# Patient Record
Sex: Female | Born: 1965 | Race: White | Hispanic: No | Marital: Married | Smoking: Never smoker
Health system: Southern US, Community
[De-identification: ages and names within clinical notes are randomized; demographics above are authoritative.]

## PROBLEM LIST (undated history)

## (undated) DIAGNOSIS — E8881 Metabolic syndrome: Secondary | ICD-10-CM

## (undated) DIAGNOSIS — I10 Essential (primary) hypertension: Secondary | ICD-10-CM

## (undated) DIAGNOSIS — L509 Urticaria, unspecified: Secondary | ICD-10-CM

## (undated) DIAGNOSIS — T783XXA Angioneurotic edema, initial encounter: Secondary | ICD-10-CM

## (undated) DIAGNOSIS — J069 Acute upper respiratory infection, unspecified: Secondary | ICD-10-CM

## (undated) DIAGNOSIS — E039 Hypothyroidism, unspecified: Secondary | ICD-10-CM

## (undated) HISTORY — DX: Acute upper respiratory infection, unspecified: J06.9

## (undated) HISTORY — DX: Metabolic syndrome: E88.81

## (undated) HISTORY — DX: Metabolic syndrome: E88.810

## (undated) HISTORY — DX: Essential (primary) hypertension: I10

## (undated) HISTORY — PX: DENTAL SURGERY: SHX609

## (undated) HISTORY — DX: Angioneurotic edema, initial encounter: T78.3XXA

## (undated) HISTORY — DX: Urticaria, unspecified: L50.9

---

## 2017-08-17 ENCOUNTER — Emergency Department (HOSPITAL_COMMUNITY): Payer: Self-pay

## 2017-08-17 ENCOUNTER — Inpatient Hospital Stay (HOSPITAL_COMMUNITY)
Admission: EM | Admit: 2017-08-17 | Discharge: 2017-09-10 | DRG: 004 | Disposition: A | Discharge status: Rehabilitation Facility | Payer: Self-pay | Attending: Internal Medicine | Admitting: Internal Medicine

## 2017-08-17 ENCOUNTER — Encounter (HOSPITAL_COMMUNITY): Payer: Self-pay | Admitting: Internal Medicine

## 2017-08-17 DIAGNOSIS — J101 Influenza due to other identified influenza virus with other respiratory manifestations: Secondary | ICD-10-CM

## 2017-08-17 DIAGNOSIS — J384 Edema of larynx: Secondary | ICD-10-CM | POA: Diagnosis not present

## 2017-08-17 DIAGNOSIS — Z4659 Encounter for fitting and adjustment of other gastrointestinal appliance and device: Secondary | ICD-10-CM

## 2017-08-17 DIAGNOSIS — R042 Hemoptysis: Secondary | ICD-10-CM | POA: Diagnosis not present

## 2017-08-17 DIAGNOSIS — J9601 Acute respiratory failure with hypoxia: Secondary | ICD-10-CM

## 2017-08-17 DIAGNOSIS — E878 Other disorders of electrolyte and fluid balance, not elsewhere classified: Secondary | ICD-10-CM

## 2017-08-17 DIAGNOSIS — K5900 Constipation, unspecified: Secondary | ICD-10-CM | POA: Diagnosis present

## 2017-08-17 DIAGNOSIS — J9600 Acute respiratory failure, unspecified whether with hypoxia or hypercapnia: Secondary | ICD-10-CM

## 2017-08-17 DIAGNOSIS — F419 Anxiety disorder, unspecified: Secondary | ICD-10-CM | POA: Diagnosis present

## 2017-08-17 DIAGNOSIS — D6489 Other specified anemias: Secondary | ICD-10-CM | POA: Diagnosis present

## 2017-08-17 DIAGNOSIS — R6521 Severe sepsis with septic shock: Secondary | ICD-10-CM | POA: Diagnosis present

## 2017-08-17 DIAGNOSIS — I10 Essential (primary) hypertension: Secondary | ICD-10-CM | POA: Diagnosis present

## 2017-08-17 DIAGNOSIS — N179 Acute kidney failure, unspecified: Secondary | ICD-10-CM | POA: Diagnosis present

## 2017-08-17 DIAGNOSIS — R7401 Elevation of levels of liver transaminase levels: Secondary | ICD-10-CM

## 2017-08-17 DIAGNOSIS — E860 Dehydration: Secondary | ICD-10-CM | POA: Diagnosis present

## 2017-08-17 DIAGNOSIS — E873 Alkalosis: Secondary | ICD-10-CM | POA: Diagnosis present

## 2017-08-17 DIAGNOSIS — Z781 Physical restraint status: Secondary | ICD-10-CM

## 2017-08-17 DIAGNOSIS — R945 Abnormal results of liver function studies: Secondary | ICD-10-CM | POA: Diagnosis present

## 2017-08-17 DIAGNOSIS — E870 Hyperosmolality and hypernatremia: Secondary | ICD-10-CM | POA: Diagnosis not present

## 2017-08-17 DIAGNOSIS — E039 Hypothyroidism, unspecified: Secondary | ICD-10-CM | POA: Diagnosis present

## 2017-08-17 DIAGNOSIS — Z930 Tracheostomy status: Secondary | ICD-10-CM

## 2017-08-17 DIAGNOSIS — S27309A Unspecified injury of lung, unspecified, initial encounter: Secondary | ICD-10-CM

## 2017-08-17 DIAGNOSIS — R1313 Dysphagia, pharyngeal phase: Secondary | ICD-10-CM | POA: Diagnosis present

## 2017-08-17 DIAGNOSIS — R1312 Dysphagia, oropharyngeal phase: Secondary | ICD-10-CM | POA: Diagnosis present

## 2017-08-17 DIAGNOSIS — E8770 Fluid overload, unspecified: Secondary | ICD-10-CM | POA: Diagnosis present

## 2017-08-17 DIAGNOSIS — D62 Acute posthemorrhagic anemia: Secondary | ICD-10-CM | POA: Diagnosis present

## 2017-08-17 DIAGNOSIS — J189 Pneumonia, unspecified organism: Secondary | ICD-10-CM | POA: Diagnosis present

## 2017-08-17 DIAGNOSIS — E876 Hypokalemia: Secondary | ICD-10-CM | POA: Diagnosis present

## 2017-08-17 DIAGNOSIS — R7989 Other specified abnormal findings of blood chemistry: Secondary | ICD-10-CM | POA: Diagnosis present

## 2017-08-17 DIAGNOSIS — J80 Acute respiratory distress syndrome: Secondary | ICD-10-CM | POA: Diagnosis present

## 2017-08-17 DIAGNOSIS — J9690 Respiratory failure, unspecified, unspecified whether with hypoxia or hypercapnia: Secondary | ICD-10-CM

## 2017-08-17 DIAGNOSIS — R7303 Prediabetes: Secondary | ICD-10-CM | POA: Diagnosis present

## 2017-08-17 DIAGNOSIS — R5381 Other malaise: Secondary | ICD-10-CM | POA: Diagnosis not present

## 2017-08-17 DIAGNOSIS — R0682 Tachypnea, not elsewhere classified: Secondary | ICD-10-CM

## 2017-08-17 DIAGNOSIS — Z79899 Other long term (current) drug therapy: Secondary | ICD-10-CM

## 2017-08-17 DIAGNOSIS — R061 Stridor: Secondary | ICD-10-CM | POA: Diagnosis not present

## 2017-08-17 DIAGNOSIS — Z978 Presence of other specified devices: Secondary | ICD-10-CM

## 2017-08-17 DIAGNOSIS — Z9911 Dependence on respirator [ventilator] status: Secondary | ICD-10-CM

## 2017-08-17 DIAGNOSIS — E871 Hypo-osmolality and hyponatremia: Secondary | ICD-10-CM | POA: Diagnosis not present

## 2017-08-17 DIAGNOSIS — Z9289 Personal history of other medical treatment: Secondary | ICD-10-CM

## 2017-08-17 DIAGNOSIS — R739 Hyperglycemia, unspecified: Secondary | ICD-10-CM | POA: Diagnosis present

## 2017-08-17 DIAGNOSIS — A419 Sepsis, unspecified organism: Principal | ICD-10-CM | POA: Diagnosis present

## 2017-08-17 DIAGNOSIS — R740 Nonspecific elevation of levels of transaminase and lactic acid dehydrogenase [LDH]: Secondary | ICD-10-CM

## 2017-08-17 DIAGNOSIS — Y95 Nosocomial condition: Secondary | ICD-10-CM | POA: Diagnosis present

## 2017-08-17 DIAGNOSIS — J1008 Influenza due to other identified influenza virus with other specified pneumonia: Secondary | ICD-10-CM | POA: Diagnosis present

## 2017-08-17 DIAGNOSIS — Z6841 Body Mass Index (BMI) 40.0 and over, adult: Secondary | ICD-10-CM

## 2017-08-17 DIAGNOSIS — Z452 Encounter for adjustment and management of vascular access device: Secondary | ICD-10-CM

## 2017-08-17 DIAGNOSIS — Z01818 Encounter for other preprocedural examination: Secondary | ICD-10-CM

## 2017-08-17 DIAGNOSIS — R000 Tachycardia, unspecified: Secondary | ICD-10-CM

## 2017-08-17 HISTORY — DX: Hypothyroidism, unspecified: E03.9

## 2017-08-17 LAB — CBC WITH DIFFERENTIAL/PLATELET
Basophils Absolute: 0 10*3/uL (ref 0.0–0.1)
Basophils Relative: 0 %
EOS ABS: 0 10*3/uL (ref 0.0–0.7)
Eosinophils Relative: 0 %
HCT: 35 % — ABNORMAL LOW (ref 36.0–46.0)
HEMOGLOBIN: 11.3 g/dL — AB (ref 12.0–15.0)
LYMPHS ABS: 0.5 10*3/uL — AB (ref 0.7–4.0)
LYMPHS PCT: 10 %
MCH: 23.5 pg — AB (ref 26.0–34.0)
MCHC: 32.3 g/dL (ref 30.0–36.0)
MCV: 72.9 fL — AB (ref 78.0–100.0)
Monocytes Absolute: 0.3 10*3/uL (ref 0.1–1.0)
Monocytes Relative: 6 %
NEUTROS PCT: 84 %
Neutro Abs: 4.5 10*3/uL (ref 1.7–7.7)
Platelets: 217 10*3/uL (ref 150–400)
RBC: 4.8 MIL/uL (ref 3.87–5.11)
RDW: 17 % — ABNORMAL HIGH (ref 11.5–15.5)
WBC: 5.2 10*3/uL (ref 4.0–10.5)

## 2017-08-17 LAB — URINALYSIS, ROUTINE W REFLEX MICROSCOPIC
Bilirubin Urine: NEGATIVE
Glucose, UA: NEGATIVE mg/dL
KETONES UR: NEGATIVE mg/dL
Leukocytes, UA: NEGATIVE
Nitrite: NEGATIVE
PROTEIN: 30 mg/dL — AB
Specific Gravity, Urine: 1.005 (ref 1.005–1.030)
Squamous Epithelial / LPF: NONE SEEN
pH: 6 (ref 5.0–8.0)

## 2017-08-17 LAB — COMPREHENSIVE METABOLIC PANEL
ALT: 30 U/L (ref 14–54)
ANION GAP: 10 (ref 5–15)
AST: 85 U/L — ABNORMAL HIGH (ref 15–41)
Albumin: 2.9 g/dL — ABNORMAL LOW (ref 3.5–5.0)
Alkaline Phosphatase: 84 U/L (ref 38–126)
BUN: 14 mg/dL (ref 6–20)
CHLORIDE: 97 mmol/L — AB (ref 101–111)
CO2: 23 mmol/L (ref 22–32)
Calcium: 8.7 mg/dL — ABNORMAL LOW (ref 8.9–10.3)
Creatinine, Ser: 1.03 mg/dL — ABNORMAL HIGH (ref 0.44–1.00)
GFR calc non Af Amer: >60 mL/min (ref >60)
Glucose, Bld: 148 mg/dL — ABNORMAL HIGH (ref 65–99)
POTASSIUM: 4.6 mmol/L (ref 3.5–5.1)
Sodium: 130 mmol/L — ABNORMAL LOW (ref 135–145)
Total Bilirubin: 1.3 mg/dL — ABNORMAL HIGH (ref 0.3–1.2)
Total Protein: 6 g/dL — ABNORMAL LOW (ref 6.5–8.1)

## 2017-08-17 LAB — PROCALCITONIN: Procalcitonin: 0.34 ng/mL

## 2017-08-17 LAB — I-STAT CG4 LACTIC ACID, ED: LACTIC ACID, VENOUS: 1.99 mmol/L — AB (ref 0.5–1.9)

## 2017-08-17 LAB — I-STAT BETA HCG BLOOD, ED (MC, WL, AP ONLY)

## 2017-08-17 MED ORDER — DEXTROSE 5 % IV SOLN: 1.0000 g | INTRAVENOUS | Status: DC

## 2017-08-17 MED ORDER — DM-GUAIFENESIN ER 30-600 MG PO TB12: 1.0000 | ORAL_TABLET | Freq: Two times a day (BID) | ORAL | Status: DC | PRN

## 2017-08-17 MED ORDER — ZOLPIDEM TARTRATE 5 MG PO TABS: 5.0000 mg | ORAL_TABLET | Freq: Every evening | ORAL | Status: DC | PRN

## 2017-08-17 MED ORDER — LEVALBUTEROL HCL 1.25 MG/0.5ML IN NEBU
1.2500 mg | INHALATION_SOLUTION | Freq: Four times a day (QID) | RESPIRATORY_TRACT | Status: DC | PRN
  Administered 2017-08-24: 1.25 mg via RESPIRATORY_TRACT
  Filled 2017-08-17 (×2): qty 0.5

## 2017-08-17 MED ORDER — DEXTROSE 5 % IV SOLN
500.0000 mg | Freq: Once | INTRAVENOUS | Status: AC
  Administered 2017-08-17: 500 mg via INTRAVENOUS
  Filled 2017-08-17: qty 500

## 2017-08-17 MED ORDER — LEVALBUTEROL HCL 1.25 MG/0.5ML IN NEBU
1.2500 mg | INHALATION_SOLUTION | Freq: Three times a day (TID) | RESPIRATORY_TRACT | Status: DC
  Administered 2017-08-18 – 2017-08-30 (×36): 1.25 mg via RESPIRATORY_TRACT
  Filled 2017-08-17 (×39): qty 0.5

## 2017-08-17 MED ORDER — SODIUM CHLORIDE 0.9 % IV BOLUS (SEPSIS)
1000.0000 mL | Freq: Once | INTRAVENOUS | Status: AC
  Administered 2017-08-17: 1000 mL via INTRAVENOUS

## 2017-08-17 MED ORDER — AZITHROMYCIN 500 MG IV SOLR
500.0000 mg | INTRAVENOUS | Status: DC
  Administered 2017-08-18 – 2017-08-20 (×3): 500 mg via INTRAVENOUS
  Filled 2017-08-17 (×4): qty 500

## 2017-08-17 MED ORDER — HYDROCOD POLST-CPM POLST ER 10-8 MG/5ML PO SUER
5.0000 mL | Freq: Once | ORAL | Status: AC
  Administered 2017-08-18: 5 mL via ORAL
  Filled 2017-08-17: qty 5

## 2017-08-17 MED ORDER — SODIUM CHLORIDE 0.9 % IV SOLN
INTRAVENOUS | Status: DC
  Administered 2017-08-17 – 2017-08-18 (×3): via INTRAVENOUS

## 2017-08-17 MED ORDER — DEXTROSE 5 % IV SOLN: 500.0000 mg | INTRAVENOUS | Status: DC

## 2017-08-17 MED ORDER — IBUPROFEN 400 MG PO TABS
400.0000 mg | ORAL_TABLET | Freq: Four times a day (QID) | ORAL | Status: DC | PRN
  Administered 2017-08-18 (×2): 400 mg via ORAL
  Filled 2017-08-17 (×2): qty 2

## 2017-08-17 MED ORDER — SODIUM CHLORIDE 0.9 % IV BOLUS (SEPSIS)
1500.0000 mL | Freq: Once | INTRAVENOUS | Status: AC
  Administered 2017-08-17: 1500 mL via INTRAVENOUS

## 2017-08-17 MED ORDER — DEXTROSE 5 % IV SOLN
1.0000 g | INTRAVENOUS | Status: DC
  Administered 2017-08-18 – 2017-08-19 (×2): 1 g via INTRAVENOUS
  Filled 2017-08-17 (×3): qty 10

## 2017-08-17 MED ORDER — ONDANSETRON HCL 4 MG/2ML IJ SOLN: 4.0000 mg | Freq: Three times a day (TID) | INTRAMUSCULAR | Status: DC | PRN

## 2017-08-17 MED ORDER — THYROID 60 MG PO TABS: 90.0000 mg | ORAL_TABLET | Freq: Every day | ORAL | Status: DC

## 2017-08-17 MED ORDER — DEXTROSE 5 % IV SOLN
1.0000 g | Freq: Once | INTRAVENOUS | Status: AC
  Administered 2017-08-17: 1 g via INTRAVENOUS
  Filled 2017-08-17: qty 10

## 2017-08-17 MED ORDER — LEVALBUTEROL HCL 1.25 MG/0.5ML IN NEBU
1.2500 mg | INHALATION_SOLUTION | Freq: Four times a day (QID) | RESPIRATORY_TRACT | Status: DC
  Administered 2017-08-17: 1.25 mg via RESPIRATORY_TRACT
  Filled 2017-08-17 (×4): qty 0.5

## 2017-08-17 MED ORDER — ACETAMINOPHEN 325 MG PO TABS: 650.0000 mg | ORAL_TABLET | Freq: Four times a day (QID) | ORAL | Status: DC | PRN

## 2017-08-17 NOTE — ED Triage Notes (Signed)
Pt to ER for evaluation of shortness of breath and weakness - states was seen at [facility] Monday and d/c with diagnosis of pneumonia. Pt presents short of breath today, O2 sats 82% on RA, placed on 4L nasal cannula. Pt is a/o x4.

## 2017-08-17 NOTE — H&P (Addendum)
History and Physical    Cassie [name] HEN:[id number] DOB: 15-Mar-1966 DOA: 08/17/2017  Referring MD/NP/PA:   PCP: Patient, No Pcp Per   Patient coming from:  The patient is coming from home.  At baseline, pt is independent for most of ADL.   Chief Complaint: cough and SOB  HPI: Cassie [name] is a 52 y.o. female with medical history significant of hypothyroidism, who presents with cough and shortness of breath.  Pt states that she has been having dry cough and SOB for almost 5 days. She also has a fever and chills. She denies any chest pain, runny nose or sore throat. Patient was seen in [facility] and had chest x-ray, was diagnosed with pneumonia on Monday. Patient also reports right ear pain, no ear drainage or hearing loss. No nausea vomiting, diarrhea, abdominal pain, symptoms of UTI or unilateral weakness. Pt state that she travelled back from [place] last weak. No tenderness in the areas.  ED Course: pt was found to have WBC 5.2, lactic acid 1.99, abnormal liver functions with AST 85, ALT 30, total bilirubin 1.3, ALP 84. negative pregnancy tests, temperature 101.2 tachycardia, tachypnea, oxygen saturation desaturation to 82% on room air, which improved to 94% on 5 L nasal cannula oxygen. Chest x-ray showed multifocal infiltration. Patient is placed on telemetry bed for observation.  Review of Systems:   General: has fevers, chills, no body weight gain, has poor appetite, has fatigue HEENT: no blurry vision, hearing changes or sore throat. Right ear pain. Respiratory: has dyspnea, coughing, no wheezing CV: no chest pain, no palpitations GI: no nausea, vomiting, abdominal pain, diarrhea, constipation GU: no dysuria, burning on urination, increased urinary frequency, hematuria  Ext: no leg edema Neuro: no unilateral weakness, numbness, or tingling, no vision change or hearing loss Skin: no rash, no skin tear. MSK: No muscle spasm, no deformity, no limitation of range of movement in  spin Heme: No easy bruising.  Travel history: No recent long distant travel.  Allergy:  Allergies  Allergen Reactions  . Sulfa Antibiotics     Past Medical History:  Diagnosis Date  . Hypothyroidism     Past Surgical History:  Procedure Laterality Date  . DENTAL SURGERY      Social History:  reports that  has never smoked. she has never used smokeless tobacco. She reports that she does not drink alcohol or use drugs.  Family History:  Family History  Problem Relation Age of Onset  . Kidney disease Father      Prior to Admission medications   Not on File    Physical Exam: Vitals:   08/17/17 2205 08/17/17 2342 08/18/17 0216 08/18/17 0228  BP: (!) 153/91  (!) 153/63   Pulse: (!) 110 (!) 106 96   Resp: (!) 22 (!) 22 (!) 22   Temp: 99.5 F (37.5 C)  (!) 103.2 F (39.6 C)   TempSrc: Oral  Oral   SpO2: 90%   91%  Weight: 102.9 kg (226 lb 12.8 oz)     Height: 5\' 4"  (1.626 m)      General: Not in acute distress HEENT:       Eyes: PERRL, EOMI, no scleral icterus.       ENT: No discharge from the ears and nose, no pharynx injection, no tonsillar enlargement.        Neck: No JVD, no bruit, no mass felt. Heme: No neck lymph node enlargement. Cardiac: S1/S2, RRR, No murmurs, No gallops or rubs. Respiratory:  No rales, wheezing,  rhonchi or rubs. GI: Soft, nondistended, nontender, no rebound pain, no organomegaly, BS present. GU: No hematuria Ext: No pitting leg edema bilaterally. 2+DP/PT pulse bilaterally. Musculoskeletal: No joint deformities, No joint redness or warmth, no limitation of ROM in spin. Skin: No rashes.  Neuro: Alert, oriented X3, cranial nerves II-XII grossly intact, moves all extremities normally. Psych: Patient is not psychotic, no suicidal or hemocidal ideation.  Labs on Admission: I have personally reviewed following labs and imaging studies  CBC: Recent Labs  Lab 08/17/17 1841  WBC 5.2  NEUTROABS 4.5  HGB 11.3*  HCT 35.0*  MCV 72.9*  PLT  161   Basic Metabolic Panel: Recent Labs  Lab 08/17/17 1841  NA 130*  K 4.6  CL 97*  CO2 23  GLUCOSE 148*  BUN 14  CREATININE 1.03*  CALCIUM 8.7*   GFR: Estimated Creatinine Clearance: 74.6 mL/min (A) (by C-G formula based on SCr of 1.03 mg/dL (H)). Liver Function Tests: Recent Labs  Lab 08/17/17 1841  AST 85*  ALT 30  ALKPHOS 84  BILITOT 1.3*  PROT 6.0*  ALBUMIN 2.9*   No results for input(s): LIPASE, AMYLASE in the last 168 hours. No results for input(s): AMMONIA in the last 168 hours. Coagulation Profile: No results for input(s): INR, PROTIME in the last 168 hours. Cardiac Enzymes: No results for input(s): CKTOTAL, CKMB, CKMBINDEX, TROPONINI in the last 168 hours. BNP (last 3 results) No results for input(s): PROBNP in the last 8760 hours. HbA1C: No results for input(s): HGBA1C in the last 72 hours. CBG: No results for input(s): GLUCAP in the last 168 hours. Lipid Profile: No results for input(s): CHOL, HDL, LDLCALC, TRIG, CHOLHDL, LDLDIRECT in the last 72 hours. Thyroid Function Tests: No results for input(s): TSH, T4TOTAL, FREET4, T3FREE, THYROIDAB in the last 72 hours. Anemia Panel: No results for input(s): VITAMINB12, FOLATE, FERRITIN, TIBC, IRON, RETICCTPCT in the last 72 hours. Urine analysis:    Component Value Date/Time   COLORURINE RED (A) 08/17/2017 2340   APPEARANCEUR CLEAR 08/17/2017 2340   LABSPEC 1.005 08/17/2017 2340   PHURINE 6.0 08/17/2017 2340   GLUCOSEU NEGATIVE 08/17/2017 2340   HGBUR LARGE (A) 08/17/2017 2340   BILIRUBINUR NEGATIVE 08/17/2017 2340   KETONESUR NEGATIVE 08/17/2017 2340   PROTEINUR 30 (A) 08/17/2017 2340   NITRITE NEGATIVE 08/17/2017 2340   LEUKOCYTESUR NEGATIVE 08/17/2017 2340   Sepsis Labs: @LABRCNTIP (procalcitonin:4,lacticidven:4) )No results found for this or any previous visit (from the past 240 hour(s)).   Radiological Exams on Admission: Dg Chest 2 View  Result Date: 08/17/2017 CLINICAL DATA:  52 year old  female with a history of pneumonia. Productive cough. EXAM: CHEST  2 VIEW COMPARISON:  None. FINDINGS: Cardiomediastinal silhouette obscured by overlying lung/pleural disease. Low lung volumes with patchy and linear opacity of the bilateral lungs, left greater than right. No pleural effusion.  No pneumothorax. IMPRESSION: Mixed airspace and interstitial opacities of the bilateral lungs, left greater than right. Given the history, findings most likely represent multifocal pneumonia, however, pulmonary edema could have this appearance. Electronically Signed   By: Corrie Mckusick D.O.   On: 08/17/2017 19:17     EKG: Independently reviewed.  Sinus tachycardia, QTC 434, LAD, poor R-wave progression, anteroseptal infarction pattern  Assessment/Plan Principal Problem:   Multifocal pneumonia Active Problems:   Acute respiratory failure with hypoxia (HCC)   Sepsis (HCC)   Hypothyroidism   Abnormal LFTs   Influenza A  Acute respiratory failure with hypoxia due to multifocal pneumonia and sepsis: Patient meets criteria for  sepsis with fever, tachycardia and tachypnea. Lactic acid is normal. Hemodynamically stable. Patient had recent long distant traveling, but she does not have signs of DVT or chest pain, clinically patient patient is most likely to have pneumonia rather than PE. Will keep this risk factor in mind, if clinically not improving with antibiotic treatment, will consider to do workup for PE.  - Will place on telemetry bed for obs. - IV Rocephin and azithromycin - Mucinex for cough  - Xopenex Neb prn for SOB - Urine legionella and S. pneumococcal antigen - Follow up blood culture x2, sputum culture and respiratory virus panel, plus Flu pcr - will get Procalcitonin and trend lactic acid level per sepsis protocol - IVF: 2.5 of NS bolus in ED, followed by 125 mL per hour of NS   Addendum: Flu pcr comes back positive for Flu A. -will start tamiflu now  Hypothyroidism: -continue home Armour,  90 mg daily  Abnormal liver function: AST 85, ALT 30, total bilirubin 1.3. unclear etiology, may be due to sepsis. -check HIV ab and hepatitis panel -Avoid tylenol -check tylenol level   DVT ppx: SCD (pt is menstruating) Code Status: Full code Family Communication:   Yes, patient's fianc at bed side Disposition Plan:  Anticipate discharge back to previous home environment Consults called:  none Admission status:   Inpatient/tele          Date of Service 08/18/2017    Ivor Costa [facility] Pager [phone]  If 7PM-7AM, please contact night-coverage [email or web address] Password [facility] 08/18/2017, 2:32 AM

## 2017-08-17 NOTE — ED Provider Notes (Signed)
[facility] EMERGENCY DEPARTMENT Provider Note  CSN: [id number] Arrival date & time: 08/17/17 1737  Chief Complaint(s) Pneumonia  HPI Cassie [name] is a 52 y.o. female   The history is provided by the patient.  Shortness of Breath  This is a new problem. The average episode lasts 5 days. The problem occurs continuously.The problem has been gradually worsening. Associated symptoms include a fever, rhinorrhea, sore throat and cough. Pertinent negatives include no hemoptysis, no orthopnea, no chest pain, no vomiting, no abdominal pain, no leg pain and no leg swelling. Treatments tried: Oral antibiotics that was started 2 days ago. The treatment provided no relief. Associated medical issues include pneumonia ( Diagnosed by urgent care with multifocal pneumonia and started on oral antibiotics). Associated medical issues do not include PE, heart failure, past MI or DVT.   Reports recent trip to [place] and returned 5-6 days ago. Endorses being on HRT.  Denies prior history of DVT/PEs.  No autoimmune disorders.  No known history of cancer.  Past Medical History No past medical history on file. Patient Active Problem List   Diagnosis Date Noted  . Multifocal pneumonia 08/17/2017  . Acute respiratory failure with hypoxia ([place]) 08/17/2017  . Sepsis ([place]) 08/17/2017  . Abnormal LFTs 08/17/2017  . Hypothyroidism    Home Medication(s) Prior to Admission medications   Not on File                                                                                                                                    Past Surgical History Past Surgical History:  Procedure Laterality Date  . DENTAL SURGERY     Family History No family history on file.  Social History Social History   Tobacco Use  . Smoking status: Never Smoker  . Smokeless tobacco: Never Used  Substance Use Topics  . Alcohol use: No    Frequency: Never  . Drug use: No   Allergies Sulfa  antibiotics  Review of Systems Review of Systems  Constitutional: Positive for fever.  HENT: Positive for rhinorrhea and sore throat.   Respiratory: Positive for cough and shortness of breath. Negative for hemoptysis.   Cardiovascular: Negative for chest pain, orthopnea and leg swelling.  Gastrointestinal: Negative for abdominal pain and vomiting.   All other systems are reviewed and are negative for acute change except as noted in the HPI  Physical Exam Vital Signs  I have reviewed the triage vital signs BP (!) 153/91 (BP Location: Right Arm)   Pulse (!) 106   Temp 101.2 F (37.5 C) (Oral)   Resp (!) 22   Ht 5\' 4"  (1.626 m)   Wt 102.9 kg (226 lb 12.8 oz)   LMP 08/17/2017 (Exact Date)   SpO2 90%   BMI 38.93 kg/m   Physical Exam  Constitutional: She is oriented to person, place, and time. She appears well-developed and well-nourished. No distress.  HENT:  Head: Normocephalic and atraumatic.  Nose: Nose normal.  Eyes: Conjunctivae and EOM are normal. Pupils are equal, round, and reactive to light. Right eye exhibits no discharge. Left eye exhibits no discharge. No scleral icterus.  Neck: Normal range of motion. Neck supple.  Cardiovascular: Regular rhythm. Tachycardia present. Exam reveals no gallop and no friction rub.  No murmur heard. Pulmonary/Chest: Effort normal. No stridor. Tachypnea noted. No respiratory distress. She has wheezes in the right middle field, the left middle field and the left lower field. She has rales in the right lower field and the left middle field.  Abdominal: Soft. She exhibits no distension. There is no tenderness.  Musculoskeletal: She exhibits no edema or tenderness.  Neurological: She is alert and oriented to person, place, and time.  Skin: Skin is warm and dry. No rash noted. She is not diaphoretic. No erythema.  Psychiatric: She has a normal mood and affect.  Vitals reviewed.   ED Results and Treatments Labs (all labs ordered are listed,  but only abnormal results are displayed) Labs Reviewed  COMPREHENSIVE METABOLIC PANEL - Abnormal; Notable for the following components:      Result Value   Sodium 130 (*)    Chloride 97 (*)    Glucose, Bld 148 (*)    Creatinine, Ser 1.03 (*)    Calcium 8.7 (*)    Total Protein 6.0 (*)    Albumin 2.9 (*)    AST 85 (*)    Total Bilirubin 1.3 (*)    All other components within normal limits  CBC WITH DIFFERENTIAL/PLATELET - Abnormal; Notable for the following components:   Hemoglobin 11.3 (*)    HCT 35.0 (*)    MCV 72.9 (*)    MCH 23.5 (*)    RDW 17.0 (*)    Lymphs Abs 0.5 (*)    All other components within normal limits  I-STAT CG4 LACTIC ACID, ED - Abnormal; Notable for the following components:   Lactic Acid, Venous 1.99 (*)    All other components within normal limits  CULTURE, BLOOD (ROUTINE X 2)  CULTURE, BLOOD (ROUTINE X 2)  RESPIRATORY PANEL BY PCR  CULTURE, EXPECTORATED SPUTUM-ASSESSMENT  GRAM STAIN  PROCALCITONIN  URINALYSIS, ROUTINE W REFLEX MICROSCOPIC  INFLUENZA PANEL BY PCR (TYPE A & B)  HIV ANTIBODY (ROUTINE TESTING)  STREP PNEUMONIAE URINARY ANTIGEN  LEGIONELLA PNEUMOPHILA SEROGP 1 UR AG  HEPATITIS PANEL, ACUTE  ACETAMINOPHEN LEVEL  I-STAT BETA HCG BLOOD, ED ([facility], [facility], AP ONLY)  I-STAT CG4 LACTIC ACID, ED                                                                                                                         EKG  EKG Interpretation  Date/Time:  Wednesday August 17 2017 17:52:20 EST Ventricular Rate:  108 PR Interval:  146 QRS Duration: 78 QT Interval:  324 QTC Calculation: 434 R Axis:   -50 Text Interpretation:  Sinus tachycardia Left axis deviation Possible Anterior infarct ,  age undetermined Abnormal ECG NO STEMI No old tracing to compare Confirmed by Addison Lank [phone]) on 08/17/2017 8:00:28 PM      Radiology Dg Chest 2 View  Result Date: 08/17/2017 CLINICAL DATA:  52 year old female with a history of pneumonia. Productive  cough. EXAM: CHEST  2 VIEW COMPARISON:  None. FINDINGS: Cardiomediastinal silhouette obscured by overlying lung/pleural disease. Low lung volumes with patchy and linear opacity of the bilateral lungs, left greater than right. No pleural effusion.  No pneumothorax. IMPRESSION: Mixed airspace and interstitial opacities of the bilateral lungs, left greater than right. Given the history, findings most likely represent multifocal pneumonia, however, pulmonary edema could have this appearance. Electronically Signed   By: Corrie Mckusick D.O.   On: 08/17/2017 19:17   Pertinent labs & imaging results that were available during my care of the patient were reviewed by me and considered in my medical decision making (see chart for details).  Medications Ordered in ED Medications  levalbuterol (XOPENEX) nebulizer solution 1.25 mg (1.25 mg Nebulization Given 08/17/17 2342)  dextromethorphan-guaiFENesin ([place] DM) 30-600 MG per 12 hr tablet 1 tablet (not administered)  ondansetron (ZOFRAN) injection 4 mg (not administered)  zolpidem (AMBIEN) tablet 5 mg (not administered)  0.9 %  sodium chloride infusion ( Intravenous New Bag/Given 08/17/17 2327)  cefTRIAXone (ROCEPHIN) 1 g in dextrose 5 % 50 mL IVPB (not administered)  azithromycin (ZITHROMAX) 500 mg in dextrose 5 % 250 mL IVPB (not administered)  thyroid (ARMOUR) tablet 90 mg (not administered)  ibuprofen (ADVIL,MOTRIN) tablet 400 mg (not administered)  chlorpheniramine-HYDROcodone (TUSSIONEX) 10-8 MG/5ML suspension 5 mL (not administered)  cefTRIAXone (ROCEPHIN) 1 g in dextrose 5 % 50 mL IVPB (0 g Intravenous Stopped 08/17/17 2136)  azithromycin (ZITHROMAX) 500 mg in dextrose 5 % 250 mL IVPB (500 mg Intravenous Transfusing/Transfer 08/17/17 2141)  sodium chloride 0.9 % bolus 1,000 mL (1,000 mLs Intravenous Transfusing/Transfer 08/17/17 2141)  sodium chloride 0.9 % bolus 1,500 mL (1,500 mLs Intravenous Transfusing/Transfer 08/17/17 2141)                                                                                                                                     Procedures Procedures CRITICAL CARE Performed by: Grayce Sessions Cardama Total critical care time: 35 minutes Critical care time was exclusive of separately billable procedures and treating other patients. Critical care was necessary to treat or prevent imminent or life-threatening deterioration. Critical care was time spent personally by me on the following activities: development of treatment plan with patient and/or surrogate as well as nursing, discussions with consultants, evaluation of patient's response to treatment, examination of patient, obtaining history from patient or surrogate, ordering and performing treatments and interventions, ordering and review of laboratory studies, ordering and review of radiographic studies, pulse oximetry and re-evaluation of patient's condition.   (including critical care time)  Medical Decision Making / ED Course I have reviewed the nursing notes for this  encounter and the patient's prior records (if available in EHR or on provided paperwork).  Clinical Course as of Aug 17 2345  Wed Aug 17, 2017  2028 Patient is febrile, tachycardic, hypoxic with saturations to 82% on room air requiring 5 L nasal cannula.    On auscultation patient had bilateral rales and wheezing.  No evidence of lower extremity swelling or edema.  Chest x-ray revealed multilobar pneumonia.  Code sepsis was initiated and patient was started on empiric antibiotics.  Lactic acid less than 2.  She does not require 30 cc/kg of IV fluid at this time but she was provided with IV fluids.  Discussed the possibility of pulmonary embolism given her recent trip and use of hormone replacement therapy however her presentation is most consistent with an infectious process.  Patient was admitted to hospitalist service for continued management.  [PC]    Clinical Course User Index [PC]  Cardama, Grayce Sessions, MD      Final Clinical Impression(s) / ED Diagnoses Final diagnoses:  Multifocal pneumonia  Acute respiratory failure with hypoxia ([place])  Sepsis, due to unspecified organism [facility])      This chart was dictated using voice recognition software.  Despite best efforts to proofread,  errors can occur which can change the documentation meaning.   Fatima Blank, MD 08/17/17 2352

## 2017-08-17 NOTE — Progress Notes (Signed)
Pharmacy Antibiotic Note  Cassie [name] is a 52 y.o. female admitted on 08/17/2017 with pneumonia.  Pharmacy has been consulted for ceftriaxone and azithromycin dosing. WBC wnl. LA 1.99. Tm 101.11F  Plan: -Ceftriaxone 1 gm IV Q 24 hours -Azithromycin 500 mg IV Q 24 hours -Monitor CBC and clinical progress -Pharmacy to sign off as no further dosage adjustments needed      Temp (24hrs), Avg:101.2 F (38.4 C), Min:101.2 F (38.4 C), Max:101.2 F (38.4 C)  Recent Labs  Lab 08/17/17 1841 08/17/17 1855  WBC 5.2  --   LATICACIDVEN  --  1.99*    CrCl cannot be calculated (No order found.).    Not on File   Thank you for allowing pharmacy to be a part of this patient's care.  Albertina Parr, PharmD., BCPS Clinical Pharmacist Pager [phone]

## 2017-08-18 ENCOUNTER — Other Ambulatory Visit: Payer: Self-pay

## 2017-08-18 ENCOUNTER — Observation Stay (HOSPITAL_COMMUNITY): Payer: Self-pay

## 2017-08-18 DIAGNOSIS — J101 Influenza due to other identified influenza virus with other respiratory manifestations: Secondary | ICD-10-CM

## 2017-08-18 DIAGNOSIS — S27309A Unspecified injury of lung, unspecified, initial encounter: Secondary | ICD-10-CM

## 2017-08-18 DIAGNOSIS — J9601 Acute respiratory failure with hypoxia: Secondary | ICD-10-CM

## 2017-08-18 LAB — RESPIRATORY PANEL BY PCR
Adenovirus: NOT DETECTED
BORDETELLA PERTUSSIS-RVPCR: NOT DETECTED
CORONAVIRUS OC43-RVPPCR: NOT DETECTED
Chlamydophila pneumoniae: NOT DETECTED
Coronavirus 229E: NOT DETECTED
Coronavirus HKU1: NOT DETECTED
Coronavirus NL63: NOT DETECTED
INFLUENZA B-RVPPCR: NOT DETECTED
Influenza A H1 2009: DETECTED — AB
METAPNEUMOVIRUS-RVPPCR: NOT DETECTED
Mycoplasma pneumoniae: NOT DETECTED
PARAINFLUENZA VIRUS 2-RVPPCR: NOT DETECTED
PARAINFLUENZA VIRUS 3-RVPPCR: NOT DETECTED
Parainfluenza Virus 1: NOT DETECTED
Parainfluenza Virus 4: NOT DETECTED
RESPIRATORY SYNCYTIAL VIRUS-RVPPCR: NOT DETECTED
Rhinovirus / Enterovirus: NOT DETECTED

## 2017-08-18 LAB — BASIC METABOLIC PANEL
ANION GAP: 10 (ref 5–15)
BUN: 12 mg/dL (ref 6–20)
CALCIUM: 7.9 mg/dL — AB (ref 8.9–10.3)
CHLORIDE: 97 mmol/L — AB (ref 101–111)
CO2: 23 mmol/L (ref 22–32)
Creatinine, Ser: 1.16 mg/dL — ABNORMAL HIGH (ref 0.44–1.00)
GFR calc Af Amer: >60 mL/min (ref >60)
GFR calc non Af Amer: 53 mL/min — ABNORMAL LOW (ref >60)
GLUCOSE: 146 mg/dL — AB (ref 65–99)
Potassium: 3.2 mmol/L — ABNORMAL LOW (ref 3.5–5.1)
Sodium: 130 mmol/L — ABNORMAL LOW (ref 135–145)

## 2017-08-18 LAB — INFLUENZA PANEL BY PCR (TYPE A & B)
Influenza A By PCR: POSITIVE — AB
Influenza B By PCR: NEGATIVE

## 2017-08-18 LAB — CBC
HCT: 32.7 % — ABNORMAL LOW (ref 36.0–46.0)
HEMOGLOBIN: 10.5 g/dL — AB (ref 12.0–15.0)
MCH: 23.8 pg — ABNORMAL LOW (ref 26.0–34.0)
MCHC: 32.1 g/dL (ref 30.0–36.0)
MCV: 74.1 fL — AB (ref 78.0–100.0)
Platelets: 172 10*3/uL (ref 150–400)
RBC: 4.41 MIL/uL (ref 3.87–5.11)
RDW: 17.4 % — AB (ref 11.5–15.5)
WBC: 5.6 10*3/uL (ref 4.0–10.5)

## 2017-08-18 LAB — ACETAMINOPHEN LEVEL: Acetaminophen (Tylenol), Serum: <10 ug/mL — ABNORMAL LOW (ref 10–30)

## 2017-08-18 LAB — HIV ANTIBODY (ROUTINE TESTING W REFLEX): HIV SCREEN 4TH GENERATION: NONREACTIVE

## 2017-08-18 LAB — PROCALCITONIN: PROCALCITONIN: 0.33 ng/mL

## 2017-08-18 LAB — STREP PNEUMONIAE URINARY ANTIGEN: Strep Pneumo Urinary Antigen: NEGATIVE

## 2017-08-18 LAB — PROTIME-INR
INR: 1.1
Prothrombin Time: 14.1 seconds (ref 11.4–15.2)

## 2017-08-18 LAB — APTT: aPTT: 37 seconds — ABNORMAL HIGH (ref 24–36)

## 2017-08-18 LAB — BRAIN NATRIURETIC PEPTIDE: B Natriuretic Peptide: 39.9 pg/mL (ref 0.0–100.0)

## 2017-08-18 MED ORDER — FUROSEMIDE 10 MG/ML IJ SOLN
40.0000 mg | Freq: Once | INTRAMUSCULAR | Status: AC
  Administered 2017-08-18: 40 mg via INTRAVENOUS
  Filled 2017-08-18: qty 4

## 2017-08-18 MED ORDER — OSELTAMIVIR PHOSPHATE 75 MG PO CAPS
75.0000 mg | ORAL_CAPSULE | Freq: Two times a day (BID) | ORAL | Status: DC
  Administered 2017-08-18 (×3): 75 mg via ORAL
  Filled 2017-08-18 (×5): qty 1

## 2017-08-18 MED ORDER — PANTOPRAZOLE SODIUM 40 MG IV SOLR
40.0000 mg | INTRAVENOUS | Status: DC
  Administered 2017-08-18 – 2017-08-21 (×4): 40 mg via INTRAVENOUS
  Filled 2017-08-18 (×3): qty 40

## 2017-08-18 MED ORDER — ALPRAZOLAM 0.5 MG PO TABS
0.5000 mg | ORAL_TABLET | Freq: Once | ORAL | Status: AC
  Administered 2017-08-18: 0.5 mg via ORAL
  Filled 2017-08-18: qty 1

## 2017-08-18 MED ORDER — GUAIFENESIN-CODEINE 100-10 MG/5ML PO SOLN
10.0000 mL | Freq: Four times a day (QID) | ORAL | Status: DC | PRN
  Administered 2017-08-18: 10 mL via ORAL
  Filled 2017-08-18: qty 10

## 2017-08-18 MED ORDER — ALPRAZOLAM 0.5 MG PO TABS
0.5000 mg | ORAL_TABLET | Freq: Three times a day (TID) | ORAL | Status: DC | PRN
Start: 2017-08-18 — End: 2017-08-19
  Administered 2017-08-18 – 2017-08-19 (×2): 0.5 mg via ORAL
  Filled 2017-08-18 (×2): qty 1

## 2017-08-18 MED ORDER — ENOXAPARIN SODIUM 60 MG/0.6ML ~~LOC~~ SOLN
50.0000 mg | SUBCUTANEOUS | Status: DC
  Administered 2017-08-19 – 2017-09-09 (×22): 50 mg via SUBCUTANEOUS
  Filled 2017-08-18 (×3): qty 0.5
  Filled 2017-08-18: qty 0.6
  Filled 2017-08-18 (×9): qty 0.5
  Filled 2017-08-18: qty 0.6
  Filled 2017-08-18: qty 0.5
  Filled 2017-08-18: qty 0.6
  Filled 2017-08-18 (×6): qty 0.5
  Filled 2017-08-18: qty 0.6

## 2017-08-18 MED ORDER — FUROSEMIDE 40 MG PO TABS
40.0000 mg | ORAL_TABLET | Freq: Once | ORAL | Status: DC
  Filled 2017-08-18: qty 1

## 2017-08-18 MED ORDER — THYROID 60 MG PO TABS
90.0000 mg | ORAL_TABLET | Freq: Every day | ORAL | Status: DC
  Administered 2017-08-18: 90 mg via ORAL
  Filled 2017-08-18 (×3): qty 1

## 2017-08-18 MED ORDER — HYDROCOD POLST-CPM POLST ER 10-8 MG/5ML PO SUER: 5.0000 mL | Freq: Two times a day (BID) | ORAL | Status: DC | PRN

## 2017-08-18 MED ORDER — ACETAMINOPHEN 325 MG PO TABS
650.0000 mg | ORAL_TABLET | Freq: Four times a day (QID) | ORAL | Status: DC | PRN
  Administered 2017-08-18 – 2017-08-19 (×3): 650 mg via ORAL
  Filled 2017-08-18 (×3): qty 2

## 2017-08-18 MED ORDER — FUROSEMIDE 10 MG/ML IJ SOLN: 20.0000 mg | Freq: Once | INTRAMUSCULAR | Status: DC

## 2017-08-18 MED ORDER — POTASSIUM CHLORIDE CRYS ER 20 MEQ PO TBCR
40.0000 meq | EXTENDED_RELEASE_TABLET | Freq: Two times a day (BID) | ORAL | Status: AC
  Administered 2017-08-18 (×2): 40 meq via ORAL
  Filled 2017-08-18 (×2): qty 2

## 2017-08-18 NOTE — Progress Notes (Signed)
Patient was taken off BIPAP per patient request. Patient states " I cannot take this anymore, I am very claustrophobic" Patient was placed on 5L [place] and is tolerating well at this time without any distress.

## 2017-08-18 NOTE — Progress Notes (Addendum)
Asked to be an impartial witness to the informed consent process for patients participation in research study. I am not in any way associated with any part of the research study. Present in the room with the patient was myself along with [place] (who read the Consent form) and Dr. Chase Caller. Pt was alert and oriented during the process and to my observation appeared to understand the reading and explanation of the informed consent. Patient  asked appropriate questions throughout. Informed Consent was read in its entirety. After completion pt consented freely without corrosion from study representation and signed consent freely on her own as witnessed at the bedside.

## 2017-08-18 NOTE — CV Procedure (Signed)
Attempted 2D Echo, postponed by RN.  Cassie [name]

## 2017-08-18 NOTE — Consult Note (Addendum)
PULMONARY / CRITICAL CARE MEDICINE   Name: Cassie [name] MRN: [id number] DOB: 06/04/1966 PCP Patient, No Pcp Per LOS 0 as of 08/18/2017     ADMISSION DATE:  08/17/2017 CONSULTATION DATE:  08/18/2017   REFERRING MD:  Dr Lawson Radar of Flu   CHIEF COMPLAINT:    HISTORY OF PRESENT ILLNESS:   I had met Ms. Cassie [name] earlier in the morning approximately at 8 AM for evaluation of IVIG 2 doses versus placebo influenza A phase 2 study.  Later in the day I came back to reassess patient about her interest in consenting for the study.  At this point in time I met tried hospitalist mentioned.  Patient had declined in the interim and had been moved from regular medical bed to stepdown unit requiring 5 L of oxygen and reporting an increase in fatigue.  Repeat chest x-ray done a standard of care showed persistent bilateral interstitial infiltrates.  At this point in time triad hospitalist asked asked me to wear a standard of care hat and do a critical care/pulmonary consultation due to worsening acute hypoxemic respiratory failure.  According to the patient she is 52 years old.  She is a non-smoker.  She has moved here from [place].  She did not have flu shot. She went on a holiday to [place] and she returned on Friday the 18th in the afternoon.  At this point in time she was feeling completely fine.  Then abruptly on 11 AM Saturday, 13 August 2017 she developed cough.  After this her symptoms started deteriorating.  Then on Monday, August 15, 2017 she saw her primary care physician was given outpatient antibiotic specified and also Tamiflu but she continued to have worsening respiratory symptoms and then presented to the emergency department and admitted with acute hypoxemic respiratory failure with bilateral pulmonary infiltrates.  Since admission she has been running a fever with her as high as 103 Fahrenheit.  Her pro-calcitonin has been 0.3.  Respiratory virus panel is positive only for  influenza A but negative for bacteria such as Bordetella, mycoplasma, chlamydia and urine Streptococcus antigen is negative.  Her white count has been normal.  She is being covered empirically for possible bacterial pneumonia with antibacterial antibiotics.  She is also on Tamiflu.  She says she was compliant with the Tamiflu since August 15, 2017.  An echo has been ordered and the results are pending.  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  She is interested in future in vitro fertilization.  She recently has had a menstrual periods.  She denies any IV drug abuse.  At this point in she in 5L [place] and pulse ox 90% without distress but desaturates to 88% when she coughs    As per history of present illness otherwise negative PAST MEDICAL HISTORY :  She  has a past medical history of Hypothyroidism.  PAST SURGICAL HISTORY: She  has a past surgical history that includes Dental surgery.  Allergies  Allergen Reactions  . Sulfa Antibiotics     No current facility-administered medications on file prior to encounter.    No current outpatient medications on file prior to encounter.    FAMILY HISTORY:  Her indicated that the status of her father is unknown.   SOCIAL HISTORY: She  reports that  has never smoked. she has never used smokeless tobacco.  She reports that she does not drink alcohol or use drugs.  REVIEW OF SYSTEMS:   As per history of present illness   VITAL SIGNS: BP (!) 145/51 (BP Location: Left Arm)   Pulse (!) 114   Temp (!) 102.2 F (39 C) (Oral)   Resp 19   Ht _0  (1.626 m)   Wt 226 lb (102.5 kg)   LMP 08/17/2017 (Exact Date)   SpO2 (!) 89%   BMI 38.79 kg/m   HEMODYNAMICS:    VENTILATOR SETTINGS:    INTAKE / OUTPUT: I/O last 3 completed shifts: In: 825 [I.V.:825] Out: 500 [Urine:500]     EXAM - done  prior to research consent at 4e12 as part of standard of care at 13.45 in discussion with DR Cassie [name]  General Appearance:    Looks tired. But not in distress  Head:    Normocephalic, without obvious abnormality, atraumatic  Eyes:    PERRL - yes, conjunctiva/corneas - yes      Ears:    Normal external ear canals, both ears  Nose:   NG tube - no but is on 5L [place]  Throat:  ETT TUBE - no , OG tube - no  Neck:   Supple,  No enlargement/tenderness/nodules     Lungs:     RR 24-28, Crackles,. No accessory muscle use  Chest wall:    No deformity  Heart:    S1 and S2 normal, no murmur, CVP - no.  Pressors - no  Abdomen:     Soft, no masses, no organomegaly  Genitalia:    Not done  Rectal:   not done  Extremities:   Extremities- intact     Skin:   Intact in exposed areas . Sacral area - no decub     Neurologic:   Sedation - none -> RASS - 0 to +1. Moves all 4s - yes. CAM-ICU - neg for delirium . Orientation - x 3+       LABS  PULMONARY No results for input(s): PHART, PCO2ART, PO2ART, HCO3, TCO2, O2SAT in the last 168 hours.  Invalid input(s): PCO2, PO2  CBC Recent Labs  Lab 08/17/17 1841 08/18/17 0756  HGB 11.3* 10.5*  HCT 35.0* 32.7*  WBC 5.2 5.6  PLT 217 172    COAGULATION No results for input(s): INR in the last 168 hours.  CARDIAC  No results for input(s): TROPONINI in the last 168 hours. No results for input(s): PROBNP in the last 168 hours.   CHEMISTRY Recent Labs  Lab 08/17/17 1841 08/18/17 0756  NA 130* 130*  K 4.6 3.2*  CL 97* 97*  CO2 23 23  GLUCOSE 148* 146*  BUN 14 12  CREATININE 1.03* 1.16*  CALCIUM 8.7* 7.9*   Estimated Creatinine Clearance: 66.1 mL/min (A) (by C-G formula based on SCr of 1.16 mg/dL (H)).   LIVER Recent Labs  Lab 08/17/17 1841  AST 85*  ALT 30  ALKPHOS 84  BILITOT 1.3*  PROT 6.0*  ALBUMIN 2.9*     INFECTIOUS Recent Labs  Lab 08/17/17 1855 08/17/17 2049 08/18/17 0756  LATICACIDVEN 1.99*  --   --   PROCALCITON   --  0.34 0.33     ENDOCRINE CBG (last 3)  No results for input(s): GLUCAP in the last 72 hours.       IMAGING x48h  - image(s) personally visualized  -   highlighted in bold Dg Chest 2 View  Result Date: 08/17/2017 CLINICAL DATA:  52 year old female with  a history of pneumonia. Productive cough. EXAM: CHEST  2 VIEW COMPARISON:  None. FINDINGS: Cardiomediastinal silhouette obscured by overlying lung/pleural disease. Low lung volumes with patchy and linear opacity of the bilateral lungs, left greater than right. No pleural effusion.  No pneumothorax. IMPRESSION: Mixed airspace and interstitial opacities of the bilateral lungs, left greater than right. Given the history, findings most likely represent multifocal pneumonia, however, pulmonary edema could have this appearance. Electronically Signed   By: Corrie Mckusick D.O.   On: 08/17/2017 19:17   Dg Chest Port 1 View  Result Date: 08/18/2017 CLINICAL DATA:  Respiratory failure. EXAM: PORTABLE CHEST 1 VIEW COMPARISON:  08/17/2017. FINDINGS: Cardiomegaly again noted. Diffuse bilateral pulmonary infiltrates are again noted. These infiltrates could represent bilateral pneumonia and/or pulmonary edema. No interim improvement. Small left pleural effusion cannot be excluded. No pneumothorax. IMPRESSION: Cardiomegaly again noted. Diffuse bilateral pulmonary infiltrates are again noted. These could represent changes of bilateral pneumonia and/or pulmonary edema. No interim change. Small left pleural effusion cannot be excluded. Electronically Signed   By: Marcello Moores  Register   On: 08/18/2017 14:16       ASSESSMENT and PLAN  Acute respiratory failure with hypoxia (HCC) Acute respiratory failure due to diffuse pulmonary infiltrates   Plan O2 for pulse ox > 88% Lasix x 1 x 63m IV (given at this point) NPO except meds BiPAP 2h on and1h off in day + Mandated QHS Place 2 PIV (difficult access per RN) Place PICC If gets worse move to ICU to  intubate  Acute lung injury The evidence for acute bacterial process is very low at this point. WBC is normal. PCT is low (0.33), Urine strep negative. PCR for chlamydia and mycoplasma negative.  DDx is acute heart failure nos (typically diastolic)  [facility] See acute resp failure section  Influenza A No evidence of concomitant viral or other bacterial infection  Plan - tamiflu - Phase 2 IvIG against Flu A high dose v low dose v placebo - she is interested in volunteering (consented formally between 2pm and 3pm 08/18/2017)   - proceed to screening, I/E and randomization phases per protocol (See separte notes)      FAMILY  - Updates: 08/18/2017 --> patient explained in presence of case manager that she has Flu A with ALI/ARDS and she could end up on ventilator. In this event she said that her DPOA should be her fiance Josh PARDUE [phone]. She denied presence of kids or parents. She did indicate that Mr JTorrie Mayersprobably does not realize how sick she is. Says he will b very surprised if she ended up on ventilator. I had met Mr PStarr Sinclairfor the study in the morning but have spent along with the patient atleast 2h trying to reach him but without success. Patient thinks he went home to get her reading glasses and probably fell asleep. LAter he called at 16.21 and I updated him how sick she is  - Inter-disciplinary family meet or Palliative Care meeting due by:  DAy 7. Current LOS is LOS 0 days  CODE STATUS    Code Status Orders  (From admission, onward)        Start     Ordered   08/17/17 2056  Full code  Continuous     08/17/17 2056    Code Status History    Date Active Date Inactive Code Status Order ID Comments User Context   This patient has a  current code status but no historical code status.       DISPO Keep in SDU. But if declines move to ICU     The patient is critically ill with multiple organ systems failure and  requires high complexity decision making for assessment and support, frequent evaluation and titration of therapies, application of advanced monitoring technologies and extensive interpretation of multiple databases.   Critical Care Time devoted to patient care services described in this note is  30  Minutes. This time reflects time of care of this signee Dr Brand Males. This critical care time does not reflect procedure time, or teaching time or supervisory time of PA/NP/Med student/Med Resident etc but could involve care discussion time   Dr. Brand Males, M.D., [facility].C.P Pulmonary and Critical Care Medicine Staff Physician, [place] Director - Interstitial Lung Disease  Program  Pulmonary [place] at [place], [place], [address]  Pager: [phone], If no answer or between  15:00h - 7:00h: call [phone] Telephone: [phone]

## 2017-08-18 NOTE — Progress Notes (Signed)
MD requested that patient be placed on BiPAP 2 hours on/1 hour off. I went to set up BiPAP and realized that we only have vents available, no v-60s at this time. Placed patient on NIV through vent. I have called around to ICU's, no v-60 available anywhere. Spoke with charge RN on 4E as well as Therapist, sports about situation, and told them I would continue to look. If no machines available, patient may have to move to another floor. MD aware.

## 2017-08-18 NOTE — Progress Notes (Signed)
Pt is on her menstrual period, has a lot of Hgb in her urine. Denies any abdorminal pain but has dyspnea on exertion with congestion and cough. Pt verbalized feeling better. Family at bedside. Will continue to monitor.

## 2017-08-18 NOTE — Research (Signed)
Title: A Randomized, Double-Blind, Placebo-Controlled Dose Ranging Study Evaluating the Safety Pharmacokinetics and Clinical Benefit of FLU-IGIV in Hospitalized Patients with Serious Influenza A infection. IA-001 (ClinicalTrials.gov Identifier: [id number], Protocol No: IA-001, [facility] Protocol #[id number])  RESEARCH SUBJECT. This research study is sponsored by [employer]    ................................................................................................... Screening done - meets I/E criteria. Will apply for randomization. If randomized, most likely research infusion early AM 08/19/17. At this point fiance LAR Josh PARDUE at bedside. Both subject Mickel Duhamel and Josh PARDUE in agreement   Dr. Brand Males, M.D., [facility].C.P Pulmonary and Critical Care Medicine Staff Physician, [place] Director - Interstitial Lung Disease  Program  Pulmonary [place] at [place], [place], [address]  Pager: [phone], If no answer or between  15:00h - 7:00h: call [phone] Telephone: [phone]

## 2017-08-18 NOTE — Research (Signed)
Title: A Randomized, Double-Blind, Placebo-Controlled Dose Ranging Study Evaluating the Safety Pharmacokinetics and Clinical Benefit of FLU-IGIV in Hospitalized Patients with Serious Influenza A infection. IA-001 (ClinicalTrials.gov Identifier: [id number], Protocol No: IA-001, [facility] Protocol #[id number])  RESEARCH SUBJECT. This research study is sponsored by [employer]   Protocol: amendment 4 -> 16 Dec 2016  The investigational product is called NP-025 aka FLU-IGIV or anti-influenza immune globulin intravenous. It is produced from source plasma collected from [place] Transport planner (FDA) licensed plasma collection establishments from healthy donors who have recovered from influenza (convalescent) and/or were vaccinated against seasonal influenza strains. The plasma contains a relatively high concentration of polyclonal antibodies directed against seasonal influenza strains, specifically influenza A strains H1N1 ([place]) and H3N2 ([place]). It is a glycoprotein of 150-160 kilodaltons against Hemagluttinin (HA) and Neuraminidase (NA) surface proteins.    ..................................................................................................Marland Kitchen Arrived at approx 13.45 08/18/2017  to consent subject for above study. PEr CRC Cassie [name] and subject - she had not read the consent because she did not realize her reading glasses were at home. She sent fiance Cassie [name] to fetch it but since then has not been contactable by her our study team (Correct number verified). Subject Cassie [name] did express continued interest in study participation and has been interested.  She even reported to [facility] and myself sthat she sent screenshot of page 1 of copy of consent I had given earlier to her cousin a Radiation protection practitioner who did not state any objection. She said Cassie Cassie [name] will be designated as LAR if she cannot consent or loses capacity and she  reports that right from the outset she has buy-in from for the study (he did express to me earlier in the day before 9am that he would be supportive of her interest). She also stated that Cassie Cassie [name] had read the ICF and agreed for her to be on the study (later Cassie Cassie [name] called back at 34.21 and confirmed that he indeed read it and is agreeable for her to be on study based on her wishes and his own substituted judgement). However, at this point she still had NOT read the consent due to lack of reading glasses (distant vision assessed to be ok). We had sub-investigator Cassie [name] give a pair of reading glasses to subject but she did not find them useful. At this point, approx 14.00h decision taken to read the entire ICF to her per FDA guidance . We used impartial witness Cassie [name]; a Radiation protection practitioner who works as Tourist information centre manager at [employer]. Clearance for this was obtained with sponsor CRA Cassie [name] and In discussion with local [place] chair Cassie [name]. The consent was read by Ms  [name] for 1 hour in its entirety in my presence and presence of impartial witness. Video recording could not be done due to lack of facility to do this. Patient then signed consent. LAter at 4:21 PM 08/18/2017 - Cassie [name] called (following the missed calls) and he is in agreement for patient to be in study. SAid he had read ICF. He has agreed to be LAR and reported he was on way to bedside    Dr. Brand [name], M.D., [facility].C.P Pulmonary and Critical Care Medicine Staff Physician, [place] Director - Interstitial Lung Disease  Program  Pulmonary [place] at [place], [place], [address]  Pager: [phone], If no answer or between  15:00h - 7:00h: call 336  [place]: [phone]

## 2017-08-18 NOTE — Progress Notes (Signed)
PROGRESS NOTE    Cassie [name]  YQM:[id number] DOB: 1966-02-11 DOA: 08/17/2017 PCP: Patient, No Pcp Per   Brief Narrative: 52 year old female with history of hypothyroidism, recently traveled to [place] presented with gradually worsening shortness of breath, cough, fever, runny nose for about a week.  Patient was seen by urgent care, chest x-ray with pneumonia and sent to hospital.  In the ER patient with influenza A positive chest x-ray with diffuse pneumonia.  Oxygen saturation was 82% in room air.  Started on antibiotics and admitted for further evaluation.  Assessment & Plan:   #Acute respiratory failure with hypoxia due to influenza A and multifocal pneumonia, sepsis: -Chest x-ray with multifocal pneumonia -Patient reported that her shortness of breath is better than yesterday but is still having cough.  She does not like Mucinex or Tessalon, wanted to get something else. -Pro-calcitonin level elevated. -Plan to continue Tamiflu, ceftriaxone, azithromycin.  Follow-up culture results. -Strep pneumo urinary antigen negative -Follow-up urine Legionella -BNP not elevated -She is requiring about 5 L of oxygen, will get echocardiogram.  I will transfer patient to stepdown unit for close monitoring. -Monitor labs, fever curve.  Tylenol as needed.  Ordered codeine as needed for the management of cough. -I have discussed with the patient and her fianc at bedside. -Continue bronchodilators.  #Hypothyroidism: Continue thyroid hormone. Check TSH.  #Mild transaminitis on admission: Acute hepatitis panel pending. I will repeat lab in am. If no improvement consider US abdomen.   # Hypokalemia: Replete potassium chloride.  Monitor lab in the morning.  DVT prophylaxis: Lovenox subcutaneous Code Status: Full code Family Communication: Discussed with the patient's fianc at bedside Disposition Plan: Transfer to stepdown unit    Consultants:   None  Procedures: None Antimicrobials:  Ceftriaxone, azithromycin and Tamiflu since 1/23.  Subjective: Seen and examined at bedside.  Patient reported that her shortness of breath is better than yesterday but is still having dyspnea while getting out of bed.  Cough is bothering.  She does not like to take Mucinex or Tessalon.  Denied headache, dizziness, nausea, vomiting, chest pain.  No abdominal pain.  Her fianc at bedside.  Objective: Vitals:   08/18/17 0452 08/18/17 0700 08/18/17 0828 08/18/17 1048  BP: 107/79 (!) 109/58  (!) 149/88  Pulse: 97 87  (!) 114  Resp: (!) 22     Temp: 98.9 F (37.2 C) 98.5 F (36.9 C)  (!) 101.6 F (38.7 C)  TempSrc: Oral Oral  Oral  SpO2: 90% 94% 90% 91%  Weight: 102.5 kg (226 lb)     Height:        Intake/Output Summary (Last 24 hours) at 08/18/2017 1055 Last data filed at 08/18/2017 0944 Gross per 24 hour  Intake 945 ml  Output 500 ml  Net 445 ml   Filed Weights   08/17/17 2205 08/18/17 0452  Weight: 102.9 kg (226 lb 12.8 oz) 102.5 kg (226 lb)    Examination:  General exam: Sitting on bed, able to speak full sentence. Respiratory system: Bilateral diffuse crackles, no wheeze, mild increased work of breathing. Cardiovascular system: S1 & S2 heard, RRR.  No pedal edema. Gastrointestinal system: Abdomen is nondistended, soft and nontender. Normal bowel sounds heard. Central nervous system: Alert and oriented. No focal neurological deficits. Extremities: Symmetric 5 x 5 power. Skin: No rashes, lesions or ulcers Psychiatry: Judgement and insight appear normal. Mood & affect appropriate.     Data Reviewed: I have personally reviewed following labs and imaging studies  CBC: Recent Labs  Lab 08/17/17 1841 08/18/17 0756  WBC 5.2 5.6  NEUTROABS 4.5  --   HGB 11.3* 10.5*  HCT 35.0* 32.7*  MCV 72.9* 74.1*  PLT 217 381   Basic Metabolic Panel: Recent Labs  Lab 08/17/17 1841 08/18/17 0756  NA 130* 130*  K 4.6 3.2*  CL 97* 97*  CO2 23 23  GLUCOSE 148* 146*  BUN 14 12    CREATININE 1.03* 1.16*  CALCIUM 8.7* 7.9*   GFR: Estimated Creatinine Clearance: 66.1 mL/min (A) (by C-G formula based on SCr of 1.16 mg/dL (H)). Liver Function Tests: Recent Labs  Lab 08/17/17 1841  AST 85*  ALT 30  ALKPHOS 84  BILITOT 1.3*  PROT 6.0*  ALBUMIN 2.9*   No results for input(s): LIPASE, AMYLASE in the last 168 hours. No results for input(s): AMMONIA in the last 168 hours. Coagulation Profile: No results for input(s): INR, PROTIME in the last 168 hours. Cardiac Enzymes: No results for input(s): CKTOTAL, CKMB, CKMBINDEX, TROPONINI in the last 168 hours. BNP (last 3 results) No results for input(s): PROBNP in the last 8760 hours. HbA1C: No results for input(s): HGBA1C in the last 72 hours. CBG: No results for input(s): GLUCAP in the last 168 hours. Lipid Profile: No results for input(s): CHOL, HDL, LDLCALC, TRIG, CHOLHDL, LDLDIRECT in the last 72 hours. Thyroid Function Tests: No results for input(s): TSH, T4TOTAL, FREET4, T3FREE, THYROIDAB in the last 72 hours. Anemia Panel: No results for input(s): VITAMINB12, FOLATE, FERRITIN, TIBC, IRON, RETICCTPCT in the last 72 hours. Sepsis Labs: Recent Labs  Lab 08/17/17 1855 08/17/17 2049 08/18/17 0756  PROCALCITON  --  0.34 0.33  LATICACIDVEN 1.99*  --   --     Recent Results (from the past 240 hour(s))  Blood Culture (routine x 2)     Status: None (Preliminary result)   Collection Time: 08/17/17  8:01 PM  Result Value Ref Range Status   Specimen Description BLOOD LEFT ANTECUBITAL  Final   Special Requests   Final    BOTTLES DRAWN AEROBIC AND ANAEROBIC Blood Culture adequate volume   Culture NO GROWTH < 12 HOURS  Final   Report Status PENDING  Incomplete  Respiratory Panel by PCR     Status: Abnormal   Collection Time: 08/17/17 10:49 PM  Result Value Ref Range Status   Adenovirus NOT DETECTED NOT DETECTED Final   Coronavirus 229E NOT DETECTED NOT DETECTED Final   Coronavirus HKU1 NOT DETECTED NOT  DETECTED Final   Coronavirus NL63 NOT DETECTED NOT DETECTED Final   Coronavirus OC43 NOT DETECTED NOT DETECTED Final   Metapneumovirus NOT DETECTED NOT DETECTED Final   Rhinovirus / Enterovirus NOT DETECTED NOT DETECTED Final   Influenza A H1 2009 DETECTED (A) NOT DETECTED Final   Influenza B NOT DETECTED NOT DETECTED Final   Parainfluenza Virus 1 NOT DETECTED NOT DETECTED Final   Parainfluenza Virus 2 NOT DETECTED NOT DETECTED Final   Parainfluenza Virus 3 NOT DETECTED NOT DETECTED Final   Parainfluenza Virus 4 NOT DETECTED NOT DETECTED Final   Respiratory Syncytial Virus NOT DETECTED NOT DETECTED Final   Bordetella pertussis NOT DETECTED NOT DETECTED Final   Chlamydophila pneumoniae NOT DETECTED NOT DETECTED Final   Mycoplasma pneumoniae NOT DETECTED NOT DETECTED Final         Radiology Studies: Dg Chest 2 View  Result Date: 08/17/2017 CLINICAL DATA:  52 year old female with a history of pneumonia. Productive cough. EXAM: CHEST  2 VIEW COMPARISON:  None. FINDINGS: Cardiomediastinal silhouette obscured by  overlying lung/pleural disease. Low lung volumes with patchy and linear opacity of the bilateral lungs, left greater than right. No pleural effusion.  No pneumothorax. IMPRESSION: Mixed airspace and interstitial opacities of the bilateral lungs, left greater than right. Given the history, findings most likely represent multifocal pneumonia, however, pulmonary edema could have this appearance. Electronically Signed   By: Corrie Mckusick D.O.   On: 08/17/2017 19:17        Scheduled Meds: . levalbuterol  1.25 mg Nebulization TID  . oseltamivir  75 mg Oral BID  . potassium chloride  40 mEq Oral BID  . thyroid  90 mg Oral QAC breakfast   Continuous Infusions: . sodium chloride 75 mL/hr at 08/18/17 0930  . azithromycin    . cefTRIAXone (ROCEPHIN)  IV       LOS: 0 days    Dron Tanna Furry, MD [facility] Pager [phone]  If 7PM-7AM, please contact  night-coverage [email or web address] Password [facility] 08/18/2017, 10:55 AM

## 2017-08-18 NOTE — Research (Addendum)
Title: A Randomized, Double-Blind, Placebo-Controlled Dose Ranging Study Evaluating the Safety Pharmacokinetics and Clinical Benefit of FLU-IGIV in Hospitalized Patients with Serious Influenza A infection. IA-001 (ClinicalTrials.gov Identifier: [id number], Protocol No: IA-001, [facility] Protocol #[id number])  RESEARCH SUBJECT. This research study is sponsored by [employer]   Protocol: amendment 4 -> 16 Dec 2016  The investigational product is called NP-025 aka FLU-IGIV or anti-influenza immune globulin intravenous. It is produced from source plasma collected from [place] Transport planner (FDA) licensed plasma collection establishments from healthy donors who have recovered from influenza (convalescent) and/or were vaccinated against seasonal influenza strains. The plasma contains a relatively high concentration of polyclonal antibodies directed against seasonal influenza strains, specifically influenza A strains H1N1 ([place]) and H3N2 ([place]). It is a glycoprotein of 150-160 kilodaltons against Hemagluttinin (HA) and Neuraminidase (NA) surface proteins.   Key Inclusion Criteria: Adult patients; locally determined positive influenza A infection (Rapid Antigen Test or PCR) from a specimen obtained within 2 days prior to randomization; onset of symptoms </= 6 days prior to randomization; experiencing >/= 1 respiratory symptom (cough, sore throat, nasal congestion) and >/= 1 constitutional symptom (headache, myalgia, feverishness or fatigue); NEW score >/=3 at screening; must have an active order for a minimum 5 day course of oseltamivir (19m/twice daily).  Key Exclusion Criteria: History of hypersensitivity to blood or plasma products; history of allergy to latex or rubber; pregnancy or lactation; known IgA deficiency; medical conditions for which receipt of a 500 mL volume of IV fluid may be dangerous; a pre-existing condition or use of medication that  may place the individual at a substantially increased risk of thrombosis; anticipated life expectancy < 90 days; confirmed bacterial pneumonia or any concurrent respiratory viral infection that is not influenza A.  Key Randomization Information: Patients will be randomized to receive a single IV administration of FLU-IGIV (NP-025) or placebo (normal saline 0.9% NaCL). Two dose levels are being assessed, each a fixed dose by volume - 10 vials (450 mL) for the high dose and 5 vials (225 mL) for the low dose. Assuming a patient weight range of 60 kg to 100 kg, patients will receive approximately 0.32 - 0.53 g/kg of IgG protein for the high dose and approximately 0.16 - 0.26 g/kg for the low dose, in a single infusion.  Key other data: Side effects with infusion  Incidence Occurrence Side effect  Common 1-15%, typically < 5% First 30 minutes of infusion Back pain, abdominal pain, nausea, vomiting  Common 1-15%, typically < 5% First few minutes to several hours after infusion Chills, fever, headache, myalgia, fatigue  Rare Seconds to several hours after infusion Hypersensitivity reaction (happens with IgA deficiency), flushing, facial swelling, dyspnea, cyanosis, shock, cardiac arrest, pneumonitis, renal failure, aseptic meningitis, hemolysis, viral infection  Rare Seconds to several hours after infusion Thrombosis (at risk patients are those with a history of DVT, arterial thrombosis, multiple cardiovascular risk factors, impaired cardiac output, advanced age, coagulation disorders, prolonged immobilization, use of estrogen, indwelling catheters, known/suspected hyperviscosity)  Recommendation: For patients who are at risk of developing thrombotic events, administer NP-025 at the minimum rate of infusion practicable, not to exceed 2 mL/min. Ensure adequate hydration in patients before administration. Monitor  Rare 1-6 hours after infusion Transfusion related Acute Lung Injury (TRALI)  Rare  Hemolysis  (dose related, happens in total > 2g/kg and non-O blood group), severe hemolysis may lead to renal dysfunction/failure.  Recommendation: Check hemoglobin pre- and post 36-96h and 7-10 days transfusion  Rare Several hours to two  days after infusion Aseptic Meningitis (AMS) (dose related, happens in total > 2g/kg)   NOTE:  1) NP-025 contains 10% maltose - will interfere with glucose measurements if non-glucose specific measurement devices are not used. Results in false high glucose levels can  result in increased insulin -> life threatening hypoglycemia  2) DSMB based IB version 2.0 16 Dec 2016 - first 20 subjects on this study - no safety signals identified  ...................................................................................................   1. Scientific Purpose  Clinical research is designed to produce generalizable knowledge and to answer questions about the safety and efficacy of intervention(s) under study in order to determine whether or not they may be useful for the care of future patients.  2. Study Procedures  Participation in a trial may involve procedures or tests, in addition to the intervention(s) under study, that are intended only or primarily to generate scientific knowledge and that are otherwise not necessary for patient care.   3. Uncertainty  For intervention(s) under study in clinical research, there often is less knowledge and more uncertainty about the risks and benefits to a population of trial participants than there is when a doctor offers a patient standard interventions.   4. Adherence to Protocol  Administration of the intervention(s) under study is typically based on a strict protocol with defined dose, scheduling, and use or avoidance of concurrent medications, compared to administration of standard interventions.  5. Clinician as Investigator  Clinicians who are in health care settings provide treatment; in a clinical trial setting, they  are also investigating safety and efficacy of an intervention. In otherwise your doctor or nurse practitioner can be wearing 2 hats - one as care giver another as Company secretary  6. Patient as Visual merchandiser Subject  Patients participating in research trials are research subjects or volunteers. In other words participating in research is 100% voluntary and at one's own free weill. The decision to participate or not participate will NOT affect patient care and the doctor-patient relationship in any way  ............................................................. BRIEF I/E criteria  1. Concern for bacterial pneumonia: d/w Dr Baxter Flattery of ID - CXR can be c/w viral pneumonia (not classic lobar consolidation of bacterial pna). PCT 0.33 x 2. WBC normal, Urine strep negative. PCR negative for bordetella, mycoplasma and legionella. So no evidence so far for bacterial infection  2. Onset of symptoms: abruptly on morning of Saturday Aug 13, 2017 following return from [place] on [place] of Friday Jan 18. 2019. Started with cough. On tamiflu since Monday 08/15/17  ................................  Patient discussion of all of the above. She said she was interested and then wanted me to go over with Josh the fiance who showed up at same time. Above points were then repated to both subject Cassie [name] and Cassie [name]. Alll of this was  8am - 8.45am: she is willing and interested. She will read over consent but needed Josh to get her reading glasses from  Her bag . Copy given around 8.45am 08/18/2017 . [facility] research team will follow. PRimary triad MD has no objections for patient to be in study   Dr. Brand Males, M.D., [facility].C.P Pulmonary and Critical Care Medicine Staff Physician, [place] Director - Interstitial Lung Disease  Program  Pulmonary [place] at [place], [place], [address]  Pager: [phone], If no answer or between   15:00h - 7:00h: call [phone] Telephone: [phone]

## 2017-08-18 NOTE — Assessment & Plan Note (Addendum)
No evidence of concomitant viral or other bacterial infection as of 08/19/2017 7:41 AM   Plan - tamiflu to continue - Phase 2 IvIG against Flu A high dose v low dose v placebo - she expressed continued interest on her own free will ; randomized and will start IV Investigational Product shortly

## 2017-08-18 NOTE — Assessment & Plan Note (Addendum)
The evidence for acute bacterial process is very low at this point. WBC is normal. PCT is low (0.33), Urine strep negative. PCR for chlamydia and mycoplasma negative.  DDx is acute heart failure nos (typically diastolic) v acute lung injury v both  Plan Await [place] for antibiottics for now See acute resp failure section

## 2017-08-18 NOTE — Progress Notes (Addendum)
Patient was re-examined at bedside.  Still SOB, cough but able to speal full sentences. She looks anxious and reported taking xanax as needed in the past for her anxiety Require 5 L of oxygen. D/w Dr. Chase Caller from PCCM. Ordered CXR, dc IVF -start low dose xanax as needed -she declined anything containing mucinex.willing to try Tussionex prn Continue to monitor. May need BIPAP   will discuss with RN   Addendum 2;20 P? CXR reviewed with Dr. Chase Caller.  Start BIpap A dose of lasix Continue to monitor.

## 2017-08-18 NOTE — Assessment & Plan Note (Addendum)
Acute respiratory failure due to diffuse pulmonary infiltrates  08/19/2017 -  worsened hypoxemia since last night. So worse, DDx is Flu A related  acute lung injury v Acute diastolic/systolic CHF NOS v both. Currently neednig 10L HFNC and just came off bipap   Plan O2 for pulse ox > 88% Repeat lasix x 2 40  x now 08/19/17 7:40 AM and again 8.21am - (patient reports it helped yesterday)  NPO except meds BiPAP PRN in day +  Mandated QHS Await PICC If gets worse move to ICU to intubate - she is at high risk Await echo report

## 2017-08-19 ENCOUNTER — Inpatient Hospital Stay (HOSPITAL_COMMUNITY): Payer: Self-pay

## 2017-08-19 DIAGNOSIS — R7401 Elevation of levels of liver transaminase levels: Secondary | ICD-10-CM

## 2017-08-19 DIAGNOSIS — R740 Nonspecific elevation of levels of transaminase and lactic acid dehydrogenase [LDH]: Secondary | ICD-10-CM

## 2017-08-19 DIAGNOSIS — J101 Influenza due to other identified influenza virus with other respiratory manifestations: Secondary | ICD-10-CM

## 2017-08-19 DIAGNOSIS — J80 Acute respiratory distress syndrome: Secondary | ICD-10-CM

## 2017-08-19 DIAGNOSIS — E878 Other disorders of electrolyte and fluid balance, not elsewhere classified: Secondary | ICD-10-CM

## 2017-08-19 DIAGNOSIS — R0600 Dyspnea, unspecified: Secondary | ICD-10-CM

## 2017-08-19 DIAGNOSIS — J189 Pneumonia, unspecified organism: Secondary | ICD-10-CM

## 2017-08-19 LAB — COMPREHENSIVE METABOLIC PANEL
ALBUMIN: 2.2 g/dL — AB (ref 3.5–5.0)
ALK PHOS: 69 U/L (ref 38–126)
ALT: 27 U/L (ref 14–54)
ANION GAP: 9 (ref 5–15)
AST: 69 U/L — ABNORMAL HIGH (ref 15–41)
BILIRUBIN TOTAL: 0.5 mg/dL (ref 0.3–1.2)
BUN: 8 mg/dL (ref 6–20)
CALCIUM: 7.9 mg/dL — AB (ref 8.9–10.3)
CO2: 25 mmol/L (ref 22–32)
Chloride: 98 mmol/L — ABNORMAL LOW (ref 101–111)
Creatinine, Ser: 1.15 mg/dL — ABNORMAL HIGH (ref 0.44–1.00)
GFR, EST NON AFRICAN AMERICAN: 54 mL/min — AB (ref >60)
GLUCOSE: 132 mg/dL — AB (ref 65–99)
POTASSIUM: 4.5 mmol/L (ref 3.5–5.1)
Sodium: 132 mmol/L — ABNORMAL LOW (ref 135–145)
TOTAL PROTEIN: 5.5 g/dL — AB (ref 6.5–8.1)

## 2017-08-19 LAB — BLOOD GAS, ARTERIAL
Acid-Base Excess: 1.8 mmol/L (ref 0.0–2.0)
Acid-Base Excess: 2.6 mmol/L — ABNORMAL HIGH (ref 0.0–2.0)
BICARBONATE: 27.2 mmol/L (ref 20.0–28.0)
Bicarbonate: 26.6 mmol/L (ref 20.0–28.0)
Delivery systems: POSITIVE
Drawn by: 252031
Drawn by: 277551
EXPIRATORY PAP: 6
FIO2: 60
FIO2: 80
INSPIRATORY PAP: 16
MECHVT: 390 mL
O2 Saturation: 93.4 %
O2 Saturation: 95.4 %
PATIENT TEMPERATURE: 99.7
PCO2 ART: 48.1 mmHg — AB (ref 32.0–48.0)
PEEP/CPAP: 14 cmH2O
PH ART: 7.376 (ref 7.350–7.450)
PH ART: 7.362 (ref 7.350–7.450)
PO2 ART: 80.2 mmHg — AB (ref 83.0–108.0)
Patient temperature: 98.6
RATE: 35 resp/min
pCO2 arterial: 47.9 mmHg (ref 32.0–48.0)
pO2, Arterial: 73.5 mmHg — ABNORMAL LOW (ref 83.0–108.0)

## 2017-08-19 LAB — HEPATITIS PANEL, ACUTE
HCV Ab: <0.1 s/co ratio (ref 0.0–0.9)
HEP A IGM: NEGATIVE
Hep B C IgM: NEGATIVE
Hepatitis B Surface Ag: NEGATIVE

## 2017-08-19 LAB — CBC WITH DIFFERENTIAL/PLATELET
Basophils Absolute: 0 10*3/uL (ref 0.0–0.1)
Basophils Relative: 0 %
EOS ABS: 0 10*3/uL (ref 0.0–0.7)
Eosinophils Relative: 0 %
HCT: 30.4 % — ABNORMAL LOW (ref 36.0–46.0)
Hemoglobin: 9.4 g/dL — ABNORMAL LOW (ref 12.0–15.0)
LYMPHS PCT: 10 %
Lymphs Abs: 0.9 10*3/uL (ref 0.7–4.0)
MCH: 23 pg — AB (ref 26.0–34.0)
MCHC: 30.9 g/dL (ref 30.0–36.0)
MCV: 74.3 fL — ABNORMAL LOW (ref 78.0–100.0)
Monocytes Absolute: 0.4 10*3/uL (ref 0.1–1.0)
Monocytes Relative: 4 %
NEUTROS ABS: 7.5 10*3/uL (ref 1.7–7.7)
Neutrophils Relative %: 86 %
PLATELETS: 192 10*3/uL (ref 150–400)
RBC: 4.09 MIL/uL (ref 3.87–5.11)
RDW: 17.9 % — ABNORMAL HIGH (ref 11.5–15.5)
WBC: 8.8 10*3/uL (ref 4.0–10.5)

## 2017-08-19 LAB — POCT I-STAT 3, ART BLOOD GAS (G3+)
ACID-BASE DEFICIT: 1 mmol/L (ref 0.0–2.0)
ACID-BASE EXCESS: 2 mmol/L (ref 0.0–2.0)
Bicarbonate: 28.6 mmol/L — ABNORMAL HIGH (ref 20.0–28.0)
Bicarbonate: 27.3 mmol/L (ref 20.0–28.0)
O2 SAT: 76 %
O2 SAT: 98 %
PCO2 ART: 52.2 mmHg — AB (ref 32.0–48.0)
PH ART: 7.183 — AB (ref 7.350–7.450)
PH ART: 7.336 — AB (ref 7.350–7.450)
Patient temperature: 100.6
Patient temperature: 102.5
TCO2: 31 mmol/L (ref 22–32)
TCO2: 29 mmol/L (ref 22–32)
pCO2 arterial: 77.2 mmHg (ref 32.0–48.0)
pO2, Arterial: 55 mmHg — ABNORMAL LOW (ref 83.0–108.0)
pO2, Arterial: 134 mmHg — ABNORMAL HIGH (ref 83.0–108.0)

## 2017-08-19 LAB — CBC
HEMATOCRIT: 36.7 % (ref 36.0–46.0)
HEMOGLOBIN: 11.5 g/dL — AB (ref 12.0–15.0)
MCH: 23.2 pg — AB (ref 26.0–34.0)
MCHC: 31.3 g/dL (ref 30.0–36.0)
MCV: 74 fL — AB (ref 78.0–100.0)
Platelets: 148 10*3/uL — ABNORMAL LOW (ref 150–400)
RBC: 4.96 MIL/uL (ref 3.87–5.11)
RDW: 17.6 % — ABNORMAL HIGH (ref 11.5–15.5)
WBC: 6.4 10*3/uL (ref 4.0–10.5)

## 2017-08-19 LAB — HEPATIC FUNCTION PANEL
ALT: 33 U/L (ref 14–54)
AST: 82 U/L — ABNORMAL HIGH (ref 15–41)
Albumin: 2.9 g/dL — ABNORMAL LOW (ref 3.5–5.0)
Alkaline Phosphatase: 81 U/L (ref 38–126)
BILIRUBIN DIRECT: 0.1 mg/dL (ref 0.1–0.5)
BILIRUBIN INDIRECT: 0.3 mg/dL (ref 0.3–0.9)
BILIRUBIN TOTAL: 0.4 mg/dL (ref 0.3–1.2)
Total Protein: 6.1 g/dL — ABNORMAL LOW (ref 6.5–8.1)

## 2017-08-19 LAB — BASIC METABOLIC PANEL
Anion gap: 12 (ref 5–15)
BUN: 9 mg/dL (ref 6–20)
CHLORIDE: 100 mmol/L — AB (ref 101–111)
CO2: 21 mmol/L — AB (ref 22–32)
Calcium: 8.6 mg/dL — ABNORMAL LOW (ref 8.9–10.3)
Creatinine, Ser: 0.78 mg/dL (ref 0.44–1.00)
GFR calc Af Amer: >60 mL/min (ref >60)
GFR calc non Af Amer: >60 mL/min (ref >60)
Glucose, Bld: 109 mg/dL — ABNORMAL HIGH (ref 65–99)
POTASSIUM: 4.3 mmol/L (ref 3.5–5.1)
Sodium: 133 mmol/L — ABNORMAL LOW (ref 135–145)

## 2017-08-19 LAB — ECHOCARDIOGRAM COMPLETE
Height: 64 in
WEIGHTICAEL: 3616 [oz_av]

## 2017-08-19 LAB — TSH: TSH: 0.236 u[IU]/mL — AB (ref 0.350–4.500)

## 2017-08-19 LAB — GLUCOSE, CAPILLARY: Glucose-Capillary: 115 mg/dL — ABNORMAL HIGH (ref 65–99)

## 2017-08-19 LAB — MRSA PCR SCREENING: MRSA by PCR: NEGATIVE

## 2017-08-19 LAB — MAGNESIUM: Magnesium: 1.8 mg/dL (ref 1.7–2.4)

## 2017-08-19 LAB — PHOSPHORUS: Phosphorus: 2.3 mg/dL — ABNORMAL LOW (ref 2.5–4.6)

## 2017-08-19 MED ORDER — FENTANYL 2500MCG IN NS 250ML (10MCG/ML) PREMIX INFUSION
25.0000 ug/h | INTRAVENOUS | Status: DC
  Administered 2017-08-19: 100 ug/h via INTRAVENOUS
  Filled 2017-08-19: qty 250

## 2017-08-19 MED ORDER — FUROSEMIDE 10 MG/ML IJ SOLN
40.0000 mg | Freq: Once | INTRAMUSCULAR | Status: AC
  Administered 2017-08-19: 40 mg via INTRAVENOUS

## 2017-08-19 MED ORDER — MIDAZOLAM HCL 2 MG/2ML IJ SOLN
INTRAMUSCULAR | Status: AC
  Administered 2017-08-19: 2 mg via INTRAVENOUS
  Filled 2017-08-19: qty 2

## 2017-08-19 MED ORDER — ORAL CARE MOUTH RINSE
15.0000 mL | Freq: Four times a day (QID) | OROMUCOSAL | Status: DC
  Administered 2017-08-19 – 2017-08-31 (×45): 15 mL via OROMUCOSAL

## 2017-08-19 MED ORDER — FENTANYL CITRATE (PF) 100 MCG/2ML IJ SOLN
INTRAMUSCULAR | Status: AC
  Administered 2017-08-19: 100 ug via INTRAVENOUS
  Filled 2017-08-19: qty 2

## 2017-08-19 MED ORDER — OSELTAMIVIR PHOSPHATE 6 MG/ML PO SUSR
75.0000 mg | Freq: Two times a day (BID) | ORAL | Status: DC
  Administered 2017-08-19: 75 mg via ORAL
  Filled 2017-08-19: qty 12.5

## 2017-08-19 MED ORDER — POTASSIUM CHLORIDE CRYS ER 10 MEQ PO TBCR
EXTENDED_RELEASE_TABLET | ORAL | Status: AC
  Filled 2017-08-19: qty 4

## 2017-08-19 MED ORDER — FUROSEMIDE 10 MG/ML IJ SOLN: 40.0000 mg | Freq: Once | INTRAMUSCULAR | Status: DC

## 2017-08-19 MED ORDER — MAGNESIUM SULFATE 2 GM/50ML IV SOLN
2.0000 g | Freq: Once | INTRAVENOUS | Status: AC
  Administered 2017-08-19: 2 g via INTRAVENOUS
  Filled 2017-08-19: qty 50

## 2017-08-19 MED ORDER — WHITE PETROLATUM EX OINT
TOPICAL_OINTMENT | CUTANEOUS | Status: AC
  Administered 2017-08-19: 16:00:00
  Filled 2017-08-19: qty 28.35

## 2017-08-19 MED ORDER — SODIUM CHLORIDE 0.9 % IV SOLN
3.0000 ug/kg/min | INTRAVENOUS | Status: DC
  Administered 2017-08-19 – 2017-08-20 (×3): 3 ug/kg/min via INTRAVENOUS
  Administered 2017-08-21 – 2017-08-23 (×4): 2.5 ug/kg/min via INTRAVENOUS
  Filled 2017-08-19 (×7): qty 20

## 2017-08-19 MED ORDER — FENTANYL 2500MCG IN NS 250ML (10MCG/ML) PREMIX INFUSION
100.0000 ug/h | INTRAVENOUS | Status: DC
  Administered 2017-08-20 – 2017-08-23 (×10): 300 ug/h via INTRAVENOUS
  Filled 2017-08-19 (×10): qty 250

## 2017-08-19 MED ORDER — MIDAZOLAM HCL 2 MG/2ML IJ SOLN
INTRAMUSCULAR | Status: AC
  Filled 2017-08-19: qty 2

## 2017-08-19 MED ORDER — ROCURONIUM BROMIDE 50 MG/5ML IV SOLN
50.0000 mg | Freq: Once | INTRAVENOUS | Status: AC
  Administered 2017-08-19: 50 mg via INTRAVENOUS

## 2017-08-19 MED ORDER — HYDRALAZINE HCL 20 MG/ML IJ SOLN
10.0000 mg | Freq: Once | INTRAMUSCULAR | Status: AC
  Administered 2017-08-19: 10 mg via INTRAVENOUS
  Filled 2017-08-19: qty 1

## 2017-08-19 MED ORDER — FENTANYL CITRATE (PF) 100 MCG/2ML IJ SOLN
50.0000 ug | Freq: Once | INTRAMUSCULAR | Status: AC
  Administered 2017-08-19: 50 ug via INTRAVENOUS
  Filled 2017-08-19: qty 2

## 2017-08-19 MED ORDER — LORAZEPAM 2 MG/ML IJ SOLN: 0.2500 mg | INTRAMUSCULAR | Status: DC | PRN

## 2017-08-19 MED ORDER — MIDAZOLAM HCL 2 MG/2ML IJ SOLN: 2.0000 mg | Freq: Once | INTRAMUSCULAR | Status: DC | PRN

## 2017-08-19 MED ORDER — MIDAZOLAM BOLUS VIA INFUSION
1.0000 mg | INTRAVENOUS | Status: DC | PRN
  Filled 2017-08-19: qty 2

## 2017-08-19 MED ORDER — FENTANYL CITRATE (PF) 100 MCG/2ML IJ SOLN
100.0000 ug | Freq: Once | INTRAMUSCULAR | Status: AC
  Administered 2017-08-19: 100 ug via INTRAVENOUS

## 2017-08-19 MED ORDER — POTASSIUM CHLORIDE CRYS ER 20 MEQ PO TBCR
40.0000 meq | EXTENDED_RELEASE_TABLET | Freq: Once | ORAL | Status: AC
  Administered 2017-08-19: 40 meq via ORAL

## 2017-08-19 MED ORDER — ACETAMINOPHEN 160 MG/5ML PO SOLN
650.0000 mg | Freq: Four times a day (QID) | ORAL | Status: DC | PRN
  Administered 2017-08-19 – 2017-09-03 (×7): 650 mg
  Filled 2017-08-19 (×7): qty 20.3

## 2017-08-19 MED ORDER — MIDAZOLAM HCL 2 MG/2ML IJ SOLN
2.0000 mg | Freq: Once | INTRAMUSCULAR | Status: AC
  Administered 2017-08-19: 2 mg via INTRAVENOUS

## 2017-08-19 MED ORDER — FUROSEMIDE 10 MG/ML IJ SOLN
20.0000 mg | Freq: Two times a day (BID) | INTRAMUSCULAR | Status: DC
  Administered 2017-08-19: 20 mg via INTRAVENOUS
  Filled 2017-08-19 (×2): qty 2

## 2017-08-19 MED ORDER — POTASSIUM PHOSPHATES 15 MMOLE/5ML IV SOLN
10.0000 mmol | Freq: Once | INTRAVENOUS | Status: AC
  Administered 2017-08-19: 10 mmol via INTRAVENOUS
  Filled 2017-08-19: qty 3.33

## 2017-08-19 MED ORDER — ETOMIDATE 2 MG/ML IV SOLN
20.0000 mg | Freq: Once | INTRAVENOUS | Status: AC
  Administered 2017-08-19: 20 mg via INTRAVENOUS

## 2017-08-19 MED ORDER — FENTANYL BOLUS VIA INFUSION
50.0000 ug | INTRAVENOUS | Status: DC | PRN
  Filled 2017-08-19: qty 50

## 2017-08-19 MED ORDER — ARTIFICIAL TEARS OPHTHALMIC OINT
1.0000 "application " | TOPICAL_OINTMENT | Freq: Three times a day (TID) | OPHTHALMIC | Status: DC
  Administered 2017-08-19 – 2017-08-23 (×11): 1 via OPHTHALMIC
  Filled 2017-08-19: qty 3.5

## 2017-08-19 MED ORDER — CHLORHEXIDINE GLUCONATE 0.12% ORAL RINSE (MEDLINE KIT)
15.0000 mL | Freq: Two times a day (BID) | OROMUCOSAL | Status: DC
  Administered 2017-08-19 – 2017-09-01 (×26): 15 mL via OROMUCOSAL

## 2017-08-19 MED ORDER — FENTANYL BOLUS VIA INFUSION
50.0000 ug | INTRAVENOUS | Status: DC | PRN
  Administered 2017-08-23 – 2017-08-31 (×15): 50 ug via INTRAVENOUS
  Filled 2017-08-19: qty 50

## 2017-08-19 MED ORDER — FENTANYL CITRATE (PF) 100 MCG/2ML IJ SOLN: 100.0000 ug | Freq: Once | INTRAMUSCULAR | Status: DC | PRN

## 2017-08-19 MED ORDER — LORAZEPAM 2 MG/ML IJ SOLN: 0.2500 mg | INTRAMUSCULAR | Status: DC

## 2017-08-19 MED ORDER — FUROSEMIDE 10 MG/ML IJ SOLN
INTRAMUSCULAR | Status: AC
  Administered 2017-08-19: 40 mg via INTRAVENOUS
  Filled 2017-08-19: qty 4

## 2017-08-19 MED ORDER — STUDY MED - FLU-IGIV 500 ML FINAL VOLUME (PI-FEINSTEIN)
500.0000 mL | Freq: Once | Status: AC
  Administered 2017-08-19: 500 mL via INTRAVENOUS
  Filled 2017-08-19: qty 500

## 2017-08-19 MED ORDER — SODIUM CHLORIDE 0.9 % IV SOLN
0.0000 mg/h | INTRAVENOUS | Status: DC
  Administered 2017-08-19: 2 mg/h via INTRAVENOUS
  Filled 2017-08-19 (×2): qty 10

## 2017-08-19 MED ORDER — SODIUM CHLORIDE 0.9 % IV SOLN
2.0000 mg/h | INTRAVENOUS | Status: DC
  Administered 2017-08-19 – 2017-08-23 (×13): 8 mg/h via INTRAVENOUS
  Administered 2017-08-23: 6 mg/h via INTRAVENOUS
  Administered 2017-08-23: 8 mg/h via INTRAVENOUS
  Administered 2017-08-24: 6 mg/h via INTRAVENOUS
  Administered 2017-08-24: 4 mg/h via INTRAVENOUS
  Administered 2017-08-25 (×2): 5 mg/h via INTRAVENOUS
  Administered 2017-08-26: 4 mg/h via INTRAVENOUS
  Administered 2017-08-26 – 2017-08-29 (×6): 5 mg/h via INTRAVENOUS
  Administered 2017-08-29 – 2017-08-30 (×2): 6 mg/h via INTRAVENOUS
  Administered 2017-08-30: 4 mg/h via INTRAVENOUS
  Administered 2017-08-30: 5 mg/h via INTRAVENOUS
  Administered 2017-08-31: 3 mg/h via INTRAVENOUS
  Filled 2017-08-19 (×33): qty 10

## 2017-08-19 MED ORDER — MIDAZOLAM BOLUS VIA INFUSION
2.0000 mg | INTRAVENOUS | Status: DC | PRN
  Administered 2017-08-24 – 2017-08-31 (×15): 2 mg via INTRAVENOUS
  Filled 2017-08-19: qty 2

## 2017-08-19 MED ORDER — NOREPINEPHRINE BITARTRATE 1 MG/ML IV SOLN
0.0000 ug/min | INTRAVENOUS | Status: DC
  Administered 2017-08-19: 4 ug/min via INTRAVENOUS
  Administered 2017-08-20: 6 ug/min via INTRAVENOUS
  Administered 2017-08-20: 3 ug/min via INTRAVENOUS
  Administered 2017-08-20: 20 ug/min via INTRAVENOUS
  Filled 2017-08-19 (×4): qty 4

## 2017-08-19 MED ORDER — CISATRACURIUM BOLUS VIA INFUSION
5.0000 mg | Freq: Once | INTRAVENOUS | Status: AC
  Administered 2017-08-19: 5 mg via INTRAVENOUS
  Filled 2017-08-19: qty 5

## 2017-08-19 MED ORDER — THYROID 60 MG PO TABS
60.0000 mg | ORAL_TABLET | Freq: Every day | ORAL | Status: DC
  Administered 2017-08-20 – 2017-09-05 (×17): 60 mg via ORAL
  Filled 2017-08-19 (×20): qty 1

## 2017-08-19 NOTE — Assessment & Plan Note (Signed)
Recent Labs  Lab 08/17/17 1841 08/18/17 1613  AST 85*  --   ALT 30  --   ALKPHOS 84  --   BILITOT 1.3*  --   PROT 6.0*  --   ALBUMIN 2.9*  --   INR  --  1.10    Very mild AST elevation yesterday. ; pre research consent  Plan RN asked to call lab and add LFT check to morning blood draw

## 2017-08-19 NOTE — Procedures (Signed)
Arterial Catheter Insertion Procedure Note Cassie [name] [id number] [birth year]  Procedure: Insertion of Arterial Catheter  Indications: Blood pressure monitoring and Frequent blood sampling  Procedure Details Consent: Unable to obtain consent because of emergent medical necessity. Time Out: Verified patient identification, verified procedure, site/side was marked, verified correct patient position, special equipment/implants available, medications/allergies/relevent history reviewed, required imaging and test results available.  Performed  Maximum sterile technique was used including antiseptics, cap, gloves, gown, hand hygiene, mask and sheet. Skin prep: Chlorhexidine; local anesthetic administered 20 gauge catheter was inserted into left radial artery using the Seldinger technique.  Evaluation Blood flow good; BP tracing good. Complications: No apparent complications.   Cassie [name] 08/19/2017

## 2017-08-19 NOTE — Progress Notes (Signed)
PULMONARY / CRITICAL CARE MEDICINE   Name: Cassie [name] MRN: [id number] DOB: 01/08/1966 PCP Patient, No Pcp Per LOS 1 as of 08/19/2017     ADMISSION DATE:  08/17/2017 CONSULTATION DATE:  08/19/2017   REFERRING MD:  Dr Lawson Radar of Flu   CHIEF COMPLAINT:    BRIEF   I had met Cassie [name] earlier in the morning approximately at 8 AM for evaluation of IVIG 2 doses versus placebo influenza A phase 2 study.  Later in the day I came back to reassess patient about her interest in consenting for the study.  At this point in time I met tried hospitalist mentioned.  Patient had declined in the interim and had been moved from regular medical bed to stepdown unit requiring 5 L of oxygen and reporting an increase in fatigue.  Repeat chest x-ray done a standard of care showed persistent bilateral interstitial infiltrates.  At this point in time triad hospitalist asked asked me to wear a standard of care hat and do a critical care/pulmonary consultation due to worsening acute hypoxemic respiratory failure.  According to the patient she is 51 years old.  She is a non-smoker.  She has moved here from [place].  She did not have flu shot. She went on a holiday to [place] and she returned on Friday the 18th in the afternoon.  At this point in time she was feeling completely fine.  Then abruptly on 11 AM Saturday, 13 August 2017 she developed cough.  After this her symptoms started deteriorating.  Then on Monday, August 15, 2017 she saw her primary care physician was given outpatient antibiotic specified and also Tamiflu but she continued to have worsening respiratory symptoms and then presented to the emergency department and admitted with acute hypoxemic respiratory failure with bilateral pulmonary infiltrates.  Since admission she has been running a fever with her as high as 103 Fahrenheit.  Her pro-calcitonin has been 0.3.  Respiratory virus panel is positive only for influenza A but negative for  bacteria such as Bordetella, mycoplasma, chlamydia and urine Streptococcus antigen is negative.  Her white count has been normal.  She is being covered empirically for possible bacterial pneumonia with antibacterial antibiotics.  She is also on Tamiflu.  She says she was compliant with the Tamiflu since August 15, 2017.  An echo has been ordered and the results are pending.  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  She is interested in future in vitro fertilization.  She recently has had a menstrual periods.  She denies any IV drug abuse.  At this point in she in 5L [place] and pulse ox 90% without distress but desaturates to 88% when she coughs  EVENTS 08/17/2017 -admit 08/18/17 - pcc consult, lasix, bipap,. Echo ordered, she consented for FLu A Iv IG v placebo study  SUBJECTIVE/OVERNIGHT/INTERVAL HX - Std of care note Called bedside, patient is decompensating from a respiratory standpoint on BiPAP  VITAL SIGNS: BP 115/72   Pulse (!) 116   Temp (!) 100.9 F (38.3 C) (Oral) Comment: nurse notified   Resp (!) 25   Ht '5\' 4"'  (1.626 m)   Wt 102.5 kg (226 lb)   LMP 08/17/2017 (Exact Date)   SpO2 93%   BMI 38.79 kg/m   HEMODYNAMICS:    VENTILATOR SETTINGS: Vent Mode: BIPAP;PCV FiO2 (%):  [  50 %-60 %] 60 % Set Rate:  [12 bmp] 12 bmp PEEP:  [5 cmH20] 5 cmH20  INTAKE / OUTPUT: I/O last 3 completed shifts: In: 9604 [P.O.:120; I.V.:825; IV Piggyback:300] Out: 2150 [Urine:2150]  EXAM - done  As standard of care at 7.25am 08/19/2017   General Appearance:    Acutely ill appearing female, significant respiratory distress  Head:    [place]/AT, PERRL, EOM-I and MMM  Eyes:    PERRL, EOM-I and MMM   Ears:    Normal external ear canals, both ears  Nose:   Clear, on BiPAP  Throat:  Clear  Neck:  Supple,  No enlargement/tenderness/nodules     Lungs:      Diffuse crackles in all lung fields  Chest wall:    No deformity  Heart:    RRR, Nl S1/S2 and -M/R/G.   Abdomen:     Soft, NT, ND and +BS  Genitalia:    Not done  Rectal:   not done  Extremities:   Extremities- no edema     Skin:   Intact in exposed areas .      Neurologic:   Sedation - none -> RASS - + . Moves all 4s - yes. CAM-ICU - neg for delirium . Orientation - x 3+           LABS  PULMONARY Recent Labs  Lab 08/19/17 1211  PHART 7.376  PCO2ART 47.9  PO2ART 73.5*  HCO3 27.2  O2SAT 93.4    CBC Recent Labs  Lab 08/17/17 1841 08/18/17 0756 08/19/17 0414  HGB 11.3* 10.5* 11.5*  HCT 35.0* 32.7* 36.7  WBC 5.2 5.6 6.4  PLT 217 172 148*    COAGULATION Recent Labs  Lab 08/18/17 1613  INR 1.10    CARDIAC  No results for input(s): TROPONINI in the last 168 hours. No results for input(s): PROBNP in the last 168 hours.   CHEMISTRY Recent Labs  Lab 08/17/17 1841 08/18/17 0756 08/19/17 0414  NA 130* 130* 133*  K 4.6 3.2* 4.3  CL 97* 97* 100*  CO2 23 23 21*  GLUCOSE 148* 146* 109*  BUN '14 12 9  ' CREATININE 1.03* 1.16* 0.78  CALCIUM 8.7* 7.9* 8.6*  MG  --   --  1.8  PHOS  --   --  2.3*   Estimated Creatinine Clearance: 95.8 mL/min (by C-G formula based on SCr of 0.78 mg/dL).   LIVER Recent Labs  Lab 08/17/17 1841 08/18/17 1613 08/19/17 0414  AST 85*  --  82*  ALT 30  --  33  ALKPHOS 84  --  81  BILITOT 1.3*  --  0.4  PROT 6.0*  --  6.1*  ALBUMIN 2.9*  --  2.9*  INR  --  1.10  --    INFECTIOUS Recent Labs  Lab 08/17/17 1855 08/17/17 2049 08/18/17 0756  LATICACIDVEN 1.99*  --   --   PROCALCITON  --  0.34 0.33   ENDOCRINE CBG (last 3)  Recent Labs    08/19/17 1421  GLUCAP 115*   IMAGING x48h  - image(s) personally visualized  -   highlighted in bold Dg Chest 2 View  Result Date: 08/17/2017 CLINICAL DATA:  52 year old female with a history of pneumonia. Productive cough. EXAM: CHEST  2 VIEW COMPARISON:  None. FINDINGS:  Cardiomediastinal silhouette obscured by overlying lung/pleural disease. Low lung volumes with patchy and linear opacity of the bilateral lungs, left greater than right. No pleural effusion.  No pneumothorax. IMPRESSION: Mixed  airspace and interstitial opacities of the bilateral lungs, left greater than right. Given the history, findings most likely represent multifocal pneumonia, however, pulmonary edema could have this appearance. Electronically Signed   By: Corrie Mckusick D.O.   On: 08/17/2017 19:17   Dg Chest Port 1 View  Result Date: 08/19/2017 CLINICAL DATA:  Acute respiratory failure EXAM: PORTABLE CHEST 1 VIEW COMPARISON:  08/18/2017 and 08/17/2017 radiographs FINDINGS: Cardiomegaly again noted. Stable to slightly increased diffuse bilateral airspace disease noted. There is no evidence of pneumothorax. No other significant changes are identified. IMPRESSION: Stable to slightly increased diffuse bilateral airspace opacities. Electronically Signed   By: Margarette Canada M.D.   On: 08/19/2017 08:47   Dg Chest Port 1 View  Result Date: 08/18/2017 CLINICAL DATA:  Respiratory failure. EXAM: PORTABLE CHEST 1 VIEW COMPARISON:  08/17/2017. FINDINGS: Cardiomegaly again noted. Diffuse bilateral pulmonary infiltrates are again noted. These infiltrates could represent bilateral pneumonia and/or pulmonary edema. No interim improvement. Small left pleural effusion cannot be excluded. No pneumothorax. IMPRESSION: Cardiomegaly again noted. Diffuse bilateral pulmonary infiltrates are again noted. These could represent changes of bilateral pneumonia and/or pulmonary edema. No interim change. Small left pleural effusion cannot be excluded. Electronically Signed   By: Marcello Moores  Register   On: 08/18/2017 14:16   I reviewed CXR myself, ARDS and infiltrate  ASSESSMENT and PLAN  Acute respiratory failure with hypoxia ([place]) Acute respiratory failure due to diffuse pulmonary infiltrates  08/19/2017 -  worsened hypoxemia since  last night. So worse, DDx is Flu A related  acute lung injury v Acute diastolic/systolic CHF NOS v both. Currently neednig 10L HFNC and just came off bipap   Plan Titrate O2 for sat of 88-92% Additional dose of lasix ordered NPO except meds BiPAP now Transfer to the ICU for closer monitoring Await PICC Await echo report Dc benzo  Acute lung injury The evidence for acute bacterial process is very low at this point. WBC is normal. PCT is low (0.33), Urine strep negative. PCR for chlamydia and mycoplasma negative.  DDx is acute heart failure nos (typically diastolic) v acute lung injury v both  Plan Continue abx F/u on cultures See acute resp failure section  Influenza B positive  Plan - tamiflu to continue - Phase 2 IvIG against Flu A high dose v low dose v placebo - she expressed continued interest on her own free will ; randomized and will start IV Investigational Product shortly  Transaminitis: repeat in AM  Plan RN asked to call lab and add LFT check to morning blood draw   Electrolyte imbalance Hypokalemia - yestrerday's resolved. On 08/19/2017   1. Magnesium - technically normal per Epic EMR  but < 2gm% so s he has mild hypomangesemia + 2. Mild hypophostaemia  +   PLAN Replete mag and phos via IV  Full code status  The patient is critically ill with multiple organ systems failure and requires high complexity decision making for assessment and support, frequent evaluation and titration of therapies, application of advanced monitoring technologies and extensive interpretation of multiple databases.   Critical Care Time devoted to patient care services described in this note is  35  Minutes. This time reflects time of care of this signee Dr [name]. This critical care time does not reflect procedure time, or teaching time or supervisory time of PA/NP/Med student/Med Resident etc but could involve care discussion time.  Rush Farmer, M.D. [facility]  Pulmonary/Critical Care Medicine. Pager: [phone]. After hours pager: [phone].  08/19/2017  2:38 PM

## 2017-08-19 NOTE — Progress Notes (Addendum)
Per MD for RT to place pt on bipap with same settings on bipap. No further orders for bipap settings. Neb tx given through the bipap. Increased IPAP 16 due to low VT. Mask secure. No breakdown on face

## 2017-08-19 NOTE — Procedures (Signed)
OGT Placement by MD  Done during intubation to deflate the stomach.  Done under direct laryngoscopy and verified by auscultation.  Rush Farmer, M.D. [facility] Pulmonary/Critical Care Medicine. Pager: [phone]. After hours pager: [phone].

## 2017-08-19 NOTE — Assessment & Plan Note (Signed)
Hypokalemia - yestrerday's resolved. On 08/19/2017   1. Magnesium - technically normal per Epic EMR  but < 2gm% so s he has mild hypomangesemia + 2. Mild hypophostaemia  +   PLAN Replete mag and phos via IV

## 2017-08-19 NOTE — Progress Notes (Addendum)
PULMONARY / CRITICAL CARE MEDICINE   Name: Cassie [name] MRN: [id number] DOB: 1965/11/14 PCP Patient, No Pcp Per LOS 1 as of 08/19/2017     ADMISSION DATE:  08/17/2017 CONSULTATION DATE:  08/19/2017   REFERRING MD:  Dr Lawson Radar of Flu   CHIEF COMPLAINT:    BRIEF   I had met Ms. Cassie [name] earlier in the morning approximately at 8 AM for evaluation of IVIG 2 doses versus placebo influenza A phase 2 study.  Later in the day I came back to reassess patient about her interest in consenting for the study.  At this point in time I met tried hospitalist mentioned.  Patient had declined in the interim and had been moved from regular medical bed to stepdown unit requiring 5 L of oxygen and reporting an increase in fatigue.  Repeat chest x-ray done a standard of care showed persistent bilateral interstitial infiltrates.  At this point in time [facility] hospitalist asked asked me to wear a standard of care hat and do a critical care/pulmonary consultation due to worsening acute hypoxemic respiratory failure.  According to the patient she is 52 years old.  She is a non-smoker.  She has moved here from [place].  She did not have flu shot. She went on a holiday to [place] and she returned on Friday the 18th in the afternoon.  At this point in time she was feeling completely fine.  Then abruptly on 11 AM Saturday, 13 August 2017 she developed cough.  After this her symptoms started deteriorating.  Then on Monday, August 15, 2017 she saw her primary care physician was given outpatient antibiotic specified and also Tamiflu but she continued to have worsening respiratory symptoms and then presented to the emergency department and admitted with acute hypoxemic respiratory failure with bilateral pulmonary infiltrates.  Since admission she has been running a fever with her as high as 103 Fahrenheit.  Her pro-calcitonin has been 0.3.  Respiratory virus panel is positive only for influenza A but negative for  bacteria such as Bordetella, mycoplasma, chlamydia and urine Streptococcus antigen is negative.  Her white count has been normal.  She is being covered empirically for possible bacterial pneumonia with antibacterial antibiotics.  She is also on Tamiflu.  She says she was compliant with the Tamiflu since August 15, 2017.  An echo has been ordered and the results are pending.  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  She is interested in future in vitro fertilization.  She recently has had a menstrual periods.  She denies any IV drug abuse.  At this point in she in 5L  and pulse ox 90% without distress but desaturates to 88% when she coughs   EVENTS 08/17/2017 -admit 08/18/17 - pcc consult, lasix, bipap,. Echo ordered, she consented for FLu A Iv IG v placebo study   SUBJECTIVE/OVERNIGHT/INTERVAL HX - Std of care note 08/19/17 - feeling better per patient and feels mucus is loosening up. However, per RN desaturated overnight and is now on 10L High flow nasal cannula. Used bipap for 6h last night and came off early this morning. She reports she still is interested in the research protocol. Randomized this AM and due to get study drug any moment now   VITAL SIGNS: BP (!) 165/81   Pulse (!) 108   Temp (!) 103.2 F (39.6  C) (Oral)   Resp (!) 23   Ht _0  (1.626 m)   Wt 226 lb (102.5 kg)   LMP 08/17/2017 (Exact Date)   SpO2 (!) 88%   BMI 38.79 kg/m   HEMODYNAMICS:    VENTILATOR SETTINGS: Vent Mode: BIPAP;PCV FiO2 (%):  [50 %] 50 % Set Rate:  [12 bmp] 12 bmp PEEP:  [5 cmH20] 5 cmH20  INTAKE / OUTPUT: I/O last 3 completed shifts: In: 6754 [P.O.:120; I.V.:825; IV Piggyback:300] Out: 2150 [Urine:2150]     EXAM - done  As standard of care at 7.25am 08/19/2017   General Appearance:    Looks better. LEss fatigued OBESE - yes  Head:     Normocephalic, without obvious abnormality, atraumatic  Eyes:    PERRL - yes, conjunctiva/corneas - clear      Ears:    Normal external ear canals, both ears  Nose:   NG tube - no but has high flow [place]  Throat:  ETT TUBE - no , OG tube - no  Neck:   Supple,  No enlargement/tenderness/nodules     Lungs:     No distress but signfiicant crackles diffuse. Pulse ox 88% on 10L HFNC  Chest wall:    No deformity  Heart:    S1 and S2 normal, tachycardic HR 120s,   Abdomen:     Soft, no masses, no organomegaly  Genitalia:    Not done  Rectal:   not done  Extremities:   Extremities- no edema     Skin:   Intact in exposed areas .      Neurologic:   Sedation - none -> RASS - + . Moves all 4s - yes. CAM-ICU - neg for delirium . Orientation - x 3+           LABS  PULMONARY No results for input(s): PHART, PCO2ART, PO2ART, HCO3, TCO2, O2SAT in the last 168 hours.  Invalid input(s): PCO2, PO2  CBC Recent Labs  Lab 08/17/17 1841 08/18/17 0756 08/19/17 0414  HGB 11.3* 10.5* 11.5*  HCT 35.0* 32.7* 36.7  WBC 5.2 5.6 6.4  PLT 217 172 148*    COAGULATION Recent Labs  Lab 08/18/17 1613  INR 1.10    CARDIAC  No results for input(s): TROPONINI in the last 168 hours. No results for input(s): PROBNP in the last 168 hours.   CHEMISTRY Recent Labs  Lab 08/17/17 1841 08/18/17 0756 08/19/17 0414  NA 130* 130* 133*  K 4.6 3.2* 4.3  CL 97* 97* 100*  CO2 23 23 21*  GLUCOSE 148* 146* 109*  BUN _1 CREATININE 1.03* 1.16* 0.78  CALCIUM 8.7* 7.9* 8.6*  MG  --   --  1.8  PHOS  --   --  2.3*   Estimated Creatinine Clearance: 95.8 mL/min (by C-G formula based on SCr of 0.78 mg/dL).   LIVER Recent Labs  Lab 08/17/17 1841 08/18/17 1613  AST 85*  --   ALT 30  --   ALKPHOS 84  --   BILITOT 1.3*  --   PROT 6.0*  --   ALBUMIN 2.9*  --   INR  --  1.10     INFECTIOUS Recent Labs  Lab 08/17/17 1855 08/17/17 2049 08/18/17 0756  LATICACIDVEN 1.99*  --   --    PROCALCITON  --  0.34 0.33     ENDOCRINE CBG (last 3)  No results for input(s): GLUCAP in the last 72 hours.  IMAGING x48h  - image(s) personally visualized  -   highlighted in bold Dg Chest 2 View  Result Date: 08/17/2017 CLINICAL DATA:  52 year old female with a history of pneumonia. Productive cough. EXAM: CHEST  2 VIEW COMPARISON:  None. FINDINGS: Cardiomediastinal silhouette obscured by overlying lung/pleural disease. Low lung volumes with patchy and linear opacity of the bilateral lungs, left greater than right. No pleural effusion.  No pneumothorax. IMPRESSION: Mixed airspace and interstitial opacities of the bilateral lungs, left greater than right. Given the history, findings most likely represent multifocal pneumonia, however, pulmonary edema could have this appearance. Electronically Signed   By: Corrie Mckusick D.O.   On: 08/17/2017 19:17   Dg Chest Port 1 View  Result Date: 08/18/2017 CLINICAL DATA:  Respiratory failure. EXAM: PORTABLE CHEST 1 VIEW COMPARISON:  08/17/2017. FINDINGS: Cardiomegaly again noted. Diffuse bilateral pulmonary infiltrates are again noted. These infiltrates could represent bilateral pneumonia and/or pulmonary edema. No interim improvement. Small left pleural effusion cannot be excluded. No pneumothorax. IMPRESSION: Cardiomegaly again noted. Diffuse bilateral pulmonary infiltrates are again noted. These could represent changes of bilateral pneumonia and/or pulmonary edema. No interim change. Small left pleural effusion cannot be excluded. Electronically Signed   By: Marcello Moores  Register   On: 08/18/2017 14:16    cxr 07/09/18 - to me same v worse; official report pending   ASSESSMENT and PLAN  Acute respiratory failure with hypoxia ([place]) Acute respiratory failure due to diffuse pulmonary infiltrates  08/19/2017 -  worsened hypoxemia since last night. So worse, DDx is Flu A related  acute lung injury v Acute diastolic/systolic CHF NOS v both.  Currently neednig 10L HFNC and just came off bipap   Plan O2 for pulse ox > 88% Repeat lasix x 1 x now 08/19/17 7:40 AM - (patient reports it helped yesterday)  NPO except meds BiPAP PRN in day +  Mandated QHS Await PICC If gets worse move to ICU to intubate Await echo report Dc benzo  Acute lung injury The evidence for acute bacterial process is very low at this point. WBC is normal. PCT is low (0.33), Urine strep negative. PCR for chlamydia and mycoplasma negative.  DDx is acute heart failure nos (typically diastolic) v acute lung injury v both  Plan Await [place] for antibiottics for now See acute resp failure section  Influenza A No evidence of concomitant viral or other bacterial infection as of 08/19/2017 7:41 AM   Plan - tamiflu to continue - Phase 2 IvIG against Flu A high dose v low dose v placebo - she expressed continued interest on her own free will ; randomized and will start IV Investigational Product shortly  Transaminitis Recent Labs  Lab 08/17/17 1841 08/18/17 1613  AST 85*  --   ALT 30  --   ALKPHOS 84  --   BILITOT 1.3*  --   PROT 6.0*  --   ALBUMIN 2.9*  --   INR  --  1.10    Very mild AST elevation yesterday. ; pre research consent  Plan RN asked to call lab and add LFT check to morning blood draw   Electrolyte imbalance Hypokalemia - yestrerday's resolved. On 08/19/2017   1. Magnesium - technically normal per Epic EMR  but < 2gm% so s he has mild hypomangesemia + 2. Mild hypophostaemia  +   PLAN Replete mag and phos via IV      FAMILY  - Updates: 08/19/2017 -->patient updated. Fiance not at bedside.     -  Inter-disciplinary family meet or Palliative Care meeting due by:  DAy 7. Current LOS is LOS 1 days  CODE STATUS    Code Status Orders  (From admission, onward)        Start     Ordered   08/17/17 2056  Full code  Continuous     08/17/17 2056    Code Status History    Date Active Date Inactive Code Status Order ID  Comments User Context   This patient has a current code status but no historical code status.       DISPO Keep in SDU. But if declines move to ICU     The patient is critically ill with multiple organ systems failure and requires high complexity decision making for assessment and support, frequent evaluation and titration of therapies, application of advanced monitoring technologies and extensive interpretation of multiple databases.   Critical Care Time devoted to patient care services described in this note is  30  Minutes. This time reflects time of care of this signee Dr Brand Males. This critical care time does not reflect procedure time, or teaching time or supervisory time of PA/NP/Med student/Med Resident etc but could involve care discussion time    Dr. Brand Males, M.D., [facility].C.P Pulmonary and Critical Care Medicine Staff Physician [facility] Pager: [phone], If no answer or between  15:00h - 7:00h: call [phone]  08/19/2017 8:18 AM

## 2017-08-19 NOTE — Progress Notes (Addendum)
PROGRESS NOTE    Cassie [name]  DVV:[id number] DOB: [birth year] DOA: 08/17/2017 PCP: Patient, No Pcp Per   Brief Narrative: 52 year old female with history of hypothyroidism, recently traveled to [place] presented with gradually worsening shortness of breath, cough, fever, runny nose for about a week.  Patient was seen by urgent care, chest x-ray with pneumonia and sent to hospital.  In the ER patient with influenza A positive chest x-ray with diffuse pneumonia.  Oxygen saturation was 82% in room air.  Started on antibiotics and admitted for further evaluation.  Assessment & Plan:   #Acute respiratory failure with hypoxia due to influenza A and multifocal pneumonia, sepsis: -Chest x-ray with multifocal pneumonia -Pro-calcitonin level elevated, strep pneumoniae urinary antigen negative, urine Legionella antigen pending.  Blood culture in progress. -Patient with respiratory distress, tachycardia, hypoxia requiring BiPAP with 60% FiO2.  She is febrile to 103.  Repeat chest x-ray with increased diffuse bilateral airspace opacities.  Continue Tamiflu, ceftriaxone, azithromycin.  Patient is also on research medication IVIG versus placebo as per pulmonary critical care.  Patient has been evaluated by pulmonary critical care, received IV Lasix.  Follow-up echocardiogram.  BNP not elevated. -Plan for Foley catheter placement. -Patient will likely need to transfer to ICU.  I have discussed with Dr. Chase Caller. The PCCM team will re-evaluate the patient.  -Continue bronchodilators.  #Hypothyroidism: TSH level suppressed therefore reduce the dose of thyroid tablet to 60 mg.  Recommended to monitor thyroid function test in 4-6-week with PCP.  #Mild transaminitis on admission: Acute hepatitis panel unremarkable.  Repeat lab.  # Hypokalemia: Improved.  Replete magnesium sulfate.  # anxiety: Patient reported anxiety and stated that she used to take Xanax as needed.  Currently on low-dose Xanax as needed to  manage her anxiety.  Continue to monitor.  DVT prophylaxis: Lovenox subcutaneous Code Status: Full code Family Communication: No family at bedside Disposition Plan: Currently in a stepdown bed.    Consultants:   None  Procedures: None Antimicrobials: Ceftriaxone, azithromycin and Tamiflu since 1/23.  Subjective: Seen and examined at bedside.  Overnight event noted.  On BiPAP.  Febrile to 103.  Tachycardic.  Currently patient is on BiPAP.  Has shortness of breath.  Denied pain.  Review of systems limited because of being on BiPAP. Two nurses from the research group and RT at bedside.   Objective: Vitals:   08/19/17 0736 08/19/17 0817 08/19/17 0822 08/19/17 0844  BP: (!) 165/81  (!) 165/103 (!) 158/87  Pulse:    (!) 116  Resp: (!) 23  (!) 32 (!) 35  Temp:  (!) 103.2 F (39.6 C)    TempSrc:  Oral    SpO2: (!) 88%  92% 90%  Weight:      Height:        Intake/Output Summary (Last 24 hours) at 08/19/2017 0857 Last data filed at 08/19/2017 6269 Gross per 24 hour  Intake 420 ml  Output 2425 ml  Net -2005 ml   Filed Weights   08/17/17 2205 08/18/17 0452  Weight: 102.9 kg (226 lb 12.8 oz) 102.5 kg (226 lb)    Examination:  General exam: On BiPAP, mild respiratory distress.Marland Kitchen Respiratory system: Bilateral diffuse crackle, no wheezing, increased work of breathing. Cardiovascular system: Regular, tachycardic, S1-S2 normal.  Trace lower extremity edema. Gastrointestinal system: Abdomen soft, nontender.  Bowel sounds positive.  Central nervous system: Alert awake.  On BiPAP.  No focal deficit noticed. Skin: No rashes, lesions or ulcers. Psychiatry: Judgement and insight did not assesss,  patient is sick.    Data Reviewed: I have personally reviewed following labs and imaging studies  CBC: Recent Labs  Lab 08/17/17 1841 08/18/17 0756 08/19/17 0414  WBC 5.2 5.6 6.4  NEUTROABS 4.5  --   --   HGB 11.3* 10.5* 11.5*  HCT 35.0* 32.7* 36.7  MCV 72.9* 74.1* 74.0*  PLT 217  172 295*   Basic Metabolic Panel: Recent Labs  Lab 08/17/17 1841 08/18/17 0756 08/19/17 0414  NA 130* 130* 133*  K 4.6 3.2* 4.3  CL 97* 97* 100*  CO2 23 23 21*  GLUCOSE 148* 146* 109*  BUN 14 12 9   CREATININE 1.03* 1.16* 0.78  CALCIUM 8.7* 7.9* 8.6*  MG  --   --  1.8  PHOS  --   --  2.3*   GFR: Estimated Creatinine Clearance: 95.8 mL/min (by C-G formula based on SCr of 0.78 mg/dL). Liver Function Tests: Recent Labs  Lab 08/17/17 1841  AST 85*  ALT 30  ALKPHOS 84  BILITOT 1.3*  PROT 6.0*  ALBUMIN 2.9*   No results for input(s): LIPASE, AMYLASE in the last 168 hours. No results for input(s): AMMONIA in the last 168 hours. Coagulation Profile: Recent Labs  Lab 08/18/17 1613  INR 1.10   Cardiac Enzymes: No results for input(s): CKTOTAL, CKMB, CKMBINDEX, TROPONINI in the last 168 hours. BNP (last 3 results) No results for input(s): PROBNP in the last 8760 hours. HbA1C: No results for input(s): HGBA1C in the last 72 hours. CBG: No results for input(s): GLUCAP in the last 168 hours. Lipid Profile: No results for input(s): CHOL, HDL, LDLCALC, TRIG, CHOLHDL, LDLDIRECT in the last 72 hours. Thyroid Function Tests: Recent Labs    08/19/17 0414  TSH 0.236*   Anemia Panel: No results for input(s): VITAMINB12, FOLATE, FERRITIN, TIBC, IRON, RETICCTPCT in the last 72 hours. Sepsis Labs: Recent Labs  Lab 08/17/17 1855 08/17/17 2049 08/18/17 0756  PROCALCITON  --  0.34 0.33  LATICACIDVEN 1.99*  --   --     Recent Results (from the past 240 hour(s))  Blood Culture (routine x 2)     Status: None (Preliminary result)   Collection Time: 08/17/17  8:01 PM  Result Value Ref Range Status   Specimen Description BLOOD LEFT ANTECUBITAL  Final   Special Requests   Final    BOTTLES DRAWN AEROBIC AND ANAEROBIC Blood Culture adequate volume   Culture NO GROWTH < 12 HOURS  Final   Report Status PENDING  Incomplete  Respiratory Panel by PCR     Status: Abnormal    Collection Time: 08/17/17 10:49 PM  Result Value Ref Range Status   Adenovirus NOT DETECTED NOT DETECTED Final   Coronavirus 229E NOT DETECTED NOT DETECTED Final   Coronavirus HKU1 NOT DETECTED NOT DETECTED Final   Coronavirus NL63 NOT DETECTED NOT DETECTED Final   Coronavirus OC43 NOT DETECTED NOT DETECTED Final   Metapneumovirus NOT DETECTED NOT DETECTED Final   Rhinovirus / Enterovirus NOT DETECTED NOT DETECTED Final   Influenza A H1 2009 DETECTED (A) NOT DETECTED Final   Influenza B NOT DETECTED NOT DETECTED Final   Parainfluenza Virus 1 NOT DETECTED NOT DETECTED Final   Parainfluenza Virus 2 NOT DETECTED NOT DETECTED Final   Parainfluenza Virus 3 NOT DETECTED NOT DETECTED Final   Parainfluenza Virus 4 NOT DETECTED NOT DETECTED Final   Respiratory Syncytial Virus NOT DETECTED NOT DETECTED Final   Bordetella pertussis NOT DETECTED NOT DETECTED Final   Chlamydophila pneumoniae NOT DETECTED  NOT DETECTED Final   Mycoplasma pneumoniae NOT DETECTED NOT DETECTED Final         Radiology Studies: Dg Chest 2 View  Result Date: 08/17/2017 CLINICAL DATA:  52 year old female with a history of pneumonia. Productive cough. EXAM: CHEST  2 VIEW COMPARISON:  None. FINDINGS: Cardiomediastinal silhouette obscured by overlying lung/pleural disease. Low lung volumes with patchy and linear opacity of the bilateral lungs, left greater than right. No pleural effusion.  No pneumothorax. IMPRESSION: Mixed airspace and interstitial opacities of the bilateral lungs, left greater than right. Given the history, findings most likely represent multifocal pneumonia, however, pulmonary edema could have this appearance. Electronically Signed   By: Corrie Mckusick D.O.   On: 08/17/2017 19:17   Dg Chest Port 1 View  Result Date: 08/19/2017 CLINICAL DATA:  Acute respiratory failure EXAM: PORTABLE CHEST 1 VIEW COMPARISON:  08/18/2017 and 08/17/2017 radiographs FINDINGS: Cardiomegaly again noted. Stable to slightly  increased diffuse bilateral airspace disease noted. There is no evidence of pneumothorax. No other significant changes are identified. IMPRESSION: Stable to slightly increased diffuse bilateral airspace opacities. Electronically Signed   By: Margarette Canada M.D.   On: 08/19/2017 08:47   Dg Chest Port 1 View  Result Date: 08/18/2017 CLINICAL DATA:  Respiratory failure. EXAM: PORTABLE CHEST 1 VIEW COMPARISON:  08/17/2017. FINDINGS: Cardiomegaly again noted. Diffuse bilateral pulmonary infiltrates are again noted. These infiltrates could represent bilateral pneumonia and/or pulmonary edema. No interim improvement. Small left pleural effusion cannot be excluded. No pneumothorax. IMPRESSION: Cardiomegaly again noted. Diffuse bilateral pulmonary infiltrates are again noted. These could represent changes of bilateral pneumonia and/or pulmonary edema. No interim change. Small left pleural effusion cannot be excluded. Electronically Signed   By: Marcello Moores  Register   On: 08/18/2017 14:16        Scheduled Meds: . enoxaparin (LOVENOX) injection  50 mg Subcutaneous Q24H  . furosemide  40 mg Intravenous Once  . levalbuterol  1.25 mg Nebulization TID  . oseltamivir  75 mg Oral BID  . pantoprazole (PROTONIX) IV  40 mg Intravenous Q24H  . thyroid  90 mg Oral QAC breakfast   Continuous Infusions: . azithromycin Stopped (08/19/17 0037)  . cefTRIAXone (ROCEPHIN)  IV Stopped (08/18/17 2319)  . magnesium sulfate 1 - 4 g bolus IVPB    . potassium PHOSPHATE IVPB (mmol)       LOS: 1 day    Dron Tanna Furry, MD [facility] Pager [phone]  If 7PM-7AM, please contact night-coverage [email or web address] Password [facility] 08/19/2017, 8:57 AM

## 2017-08-19 NOTE — Progress Notes (Signed)
RT placed pt on bipap around 12am. Pt very anxious about wearing mask. Currently sats 94% with mask on. Currently resting. Will continue to closely monitor pt.  Jaymes Graff, RN

## 2017-08-19 NOTE — Progress Notes (Signed)
RT spoke with RN about PT condition RN notified about FiO2 increase to 80% pt satting 92-95% RN  spoke with the doctor regarding pt's status.

## 2017-08-19 NOTE — Progress Notes (Addendum)
Pt has arrived onto the unit at 1300.

## 2017-08-19 NOTE — Procedures (Signed)
Central Venous Catheter Insertion Procedure Note Criselda Starke [id number] [birth year]  Procedure: Insertion of Central Venous Catheter Indications: Assessment of intravascular volume, Drug and/or fluid administration and Frequent blood sampling  Procedure Details Consent: Unable to obtain consent because of emergent medical necessity. Time Out: Verified patient identification, verified procedure, site/side was marked, verified correct patient position, special equipment/implants available, medications/allergies/relevent history reviewed, required imaging and test results available.  Performed  Maximum sterile technique was used including antiseptics, cap, gloves, gown, hand hygiene, mask and sheet. Skin prep: Chlorhexidine; local anesthetic administered A antimicrobial bonded/coated triple lumen catheter was placed in the right external jugular vein using the Seldinger technique.  Evaluation Blood flow good Complications: No apparent complications Patient did tolerate procedure well. Chest X-ray ordered to verify placement.  CXR: pending.  Hayden Pedro, AGACNP-BC [facility]  Pgr: [phone]  PCCM Pgr: [phone]

## 2017-08-19 NOTE — Research (Signed)
Title: A Randomized, Double-Blind, Placebo-Controlled Dose Ranging Study Evaluating the Safety Pharmacokinetics and Clinical Benefit of FLU-IGIV in Hospitalized Patients with Serious Influenza A infection. IA-001 (ClinicalTrials.gov Identifier: [id number], Protocol No: IA-001, [facility] Protocol #[id number])  RESEARCH SUBJECT. This research study is sponsored by [employer]   Protocol: amendment 4 -> 16 Dec 2016  The investigational product is called NP-025 aka FLU-IGIV or anti-influenza immune globulin intravenous. It is produced from source plasma collected from [place] Transport planner (FDA) licensed plasma collection establishments from healthy donors who have recovered from influenza (convalescent) and/or were vaccinated against seasonal influenza strains. The plasma contains a relatively high concentration of polyclonal antibodies directed against seasonal influenza strains, specifically influenza A strains H1N1 ([place]) and H3N2 ([place]). It is a glycoprotein of 150-160 kilodaltons against Hemagluttinin (HA) and Neuraminidase (NA) surface proteins.   ...................................................................................................  Subject [id number], Cassie [name], was randomized on 25/Jan/2019 at 0644 EST. Pharmacy notified to prepare study medication.  Prior to study drug administration at 0736 Vital signs were checked by this coordinator and were as follows: HR 126, RR 29, BP 165/81, saturations 87% 10lpm. Subject was alert and oriented, but did look fatigued. Subject stated she was tired. Refer to progress note from Leverne Humbles, RN for infusion details. Infusion end time is 10:55.  Post-infusion study procedures completed. The subject was arousable to voice and and was able to nod yes or no to questions.  Vital signs collected at 11:12-temp 99.7 axillary (due to subject being on Bipap and unable to tolerate taking  off), RR 29, HR 101, BP 121/64. Flu Symptoms collected at 11:14 and subject nodded yes to fatigue and shortness of breath. She nodded "no" to all other symptoms and nodded "no" to experiencing any pain. New score collected at 11:15 and total score is 12. PK sample collected at 11:58.  Subject Fiance did not arrive during the time research staff was with the subject.  [place] Bing, Brent, [facility] Certified [place] Coordinator [place], [place] [phone]

## 2017-08-19 NOTE — Progress Notes (Signed)
EOS recap-  At 1705 pt was intubated by CCM MD d/t respiratory distress and respiratory failure.   Pt w/ difficult airway requiring intubation attempts x 2.  Pt desaturated to 60% briefly despite pt pre-oxygenated on 100% prior, and Ambu bagged on 100% also.    S/p intubation, pt w/ large/copious amounts of fresh blood in ETT.  Pt continued desaturations and peep increased to 16 per MD at bedside.    1730= VT placed on 6 ml/kg IBW d/t increased plat/PIP and per MD order. Pt still desat at 80-82% MD aware.  1800=MD placed a-line d/t low sat 78-80%   1808= panic ABG results called to Dr Nelda Marseille.  MD ordered Vt at 80ml/kg per IBW.  Plat=40-42 and RR set at 35 to maximize ventilation.  No auto peep noted. 1810= NP w/ CCM at bedside ordered  vt reduced to 26ml/kg d/t high plat/PIP and also ordering RN to start paralytics and sedation d/t pt starting to wake-up and having vent dyssynchrony.   1840=pt appears more comfortable s/p sedation and sat now 90%

## 2017-08-19 NOTE — Progress Notes (Signed)
  Echocardiogram 2D Echocardiogram has been performed.  Cassie [name] 08/19/2017, 10:29 AM

## 2017-08-19 NOTE — Progress Notes (Signed)
PHARMACY RELATED CONSULT NOTE  Flu-IVIG A Randomized, Double-Blind, Placebo-Controlled Dose Ranging Study Evaluating the Safety Pharmacokinetics and Clinical Benefit of FLU-IGIV in Hospitalized Patients with Serious Influenza A infection.  Discussed with research team:  -Using Cockroft-Gault to estimate GFR by using CrCl, Ms. Kirchman CrCl is 67 ml/min; therefore, meeting study inclusion criteria -No concern regarding sulfa allergy and Flu-IVIG -Patient had concern regarding her desire to do IVF in the future and wether the study drug would effect this. I relayed to the team that there is little information available regarding Flu-IVIG and fertility. However there is nothing in the drug formulation that would make me concerned that it would adversely effect her fertility or ability to have a successful IVF.    Hughes Better, PharmD, BCPS Clinical Pharmacist 08/19/2017 12:10 AM

## 2017-08-19 NOTE — Progress Notes (Signed)
Bipap check done. Mask secure. No breakdown noted on face. Pt can not tol oral care at this time due to desat.

## 2017-08-19 NOTE — Research (Signed)
Title: A Randomized, Double-Blind, Placebo-Controlled Dose Ranging Study Evaluating the Safety Pharmacokinetics and Clinical Benefit of FLU-IGIV in Hospitalized Patients with Serious Influenza A infection. IA-001 (ClinicalTrials.gov Identifier: [id number], Protocol No: IA-001, [facility] Protocol #[id number])  RESEARCH SUBJECT. This research study is sponsored by [employer]   Protocol: amendment 4 -> 16 Dec 2016  The investigational product is called NP-025 aka FLU-IGIV or anti-influenza immune globulin intravenous. It is produced from source plasma collected from [place] Transport planner (FDA) licensed plasma collection establishments from healthy donors who have recovered from influenza (convalescent) and/or were vaccinated against seasonal influenza strains. The plasma contains a relatively high concentration of polyclonal antibodies directed against seasonal influenza strains, specifically influenza A strains H1N1 ([place]) and H3N2 ([place]). It is a glycoprotein of 150-160 kilodaltons against Hemagluttinin (HA) and Neuraminidase (NA) surface proteins.   26 Nov 2016 - first 20 subjects on this study - no safety signals identified  Clinical Research Nurse note: Prior to start of infusion, subject Cassie [name] was awake and alert. She denied headache or any other pain and nausea. Vital signs were obtained at 0736- BP 165/81, HR 125, RR 23, Sats 88. Dr. Chase Caller present for infusion start at 0749. Infusion started via 20G PIV at rate of 1.0 mL/min. Dr. Chase Caller remained present during the initial first 30 min of infusion.   Subject denies headache, any other pain, and nausea. IV site without complications.  Elevated temp of 103.2 noted at 0817. Subject had documented temp of 102.9 at midnight. Tylenol given by bedside nurse. Vital signs at 0822 were as follows: BP 165/103, HR 124, RR 32, Sats 92. She was noted to have increased work of  breathing. This is not believed to be related to the infusion. Subject placed on BiPap, as she had been overnight and infusion increased at 0832 by an additional 1.0 mL/min per protocol.   Subject denies headache, any other pain, and nausea. Vital signs at 0851 as follows: BP 158/87, HR 119, RR 34, Sats 94. She is lethargic but responsive to stimulation. Dr. Romilda Garret updated. Infusion increased at 0852 by an additional 1.0 mL/min per protocol.   Subject remains stable. Denies headache, any other pain, and nausea. Vital signs at 0912 BP 124/60, HR 115, RR 30, Sats 91. She remains lethargic on BiPap but responsive to stimulation. Infusion increased at 0913 by an additional 1.0 mL/min per protocol (max dose). Vital signs at 0930- 125/58, HR 107, RR 30, Sats 93. Updated Dr. Chase Caller.   Vital signs stable at 1000- BP 112/85, HR 105, RR 26, Sats 95. Subject more responsive at 1030. Vital signs- 128/65, HR 102, RR 29, Sats 92. Infusion complete at 1055. Subject denies headache, any other pain, and nausea. She is lethargic but responsive to voice. Vital signs at 1100- BP 129/64, HR 104, RR 29, Sats 90 on BiPap.

## 2017-08-19 NOTE — Research (Signed)
Title: A Randomized, Double-Blind, Placebo-Controlled Dose Ranging Study Evaluating the Safety Pharmacokinetics and Clinical Benefit of FLU-IGIV in Hospitalized Patients with Serious Influenza A infection. IA-001 (ClinicalTrials.gov Identifier: [id number], Protocol No: IA-001, [facility] Protocol #[id number])  RESEARCH SUBJECT. This research study is sponsored by [employer]   Protocol: amendment 4 -> 16 Dec 2016  The investigational product is called NP-025 aka FLU-IGIV or anti-influenza immune globulin intravenous. It is produced from source plasma collected from [place] Transport planner (FDA) licensed plasma collection establishments from healthy donors who have recovered from influenza (convalescent) and/or were vaccinated against seasonal influenza strains. The plasma contains a relatively high concentration of polyclonal antibodies directed against seasonal influenza strains, specifically influenza A strains H1N1 ([place]) and H3N2 ([place]). It is a glycoprotein of 150-160 kilodaltons against Hemagluttinin (HA) and Neuraminidase (NA) surface proteins.   26 Nov 2016 - first 20 subjects on this study - no safety signals identified  ...................................................................................................  S: PI note: I met subject Cassie [name] earlier this morning. She expressed continued interest in participating on her voluntary will in above research study. Infusion started at 7.49am . I stayed with her at beside past 30 minutes. She has continued to have respiratory distress through the infusion but did not report headache, or bodyache or any change to her situation other than ongoing respiratory distress   Dr. Brand Males, M.D., [facility].C.P Pulmonary and Critical Care Medicine Staff Physician, [place] Director - Interstitial Lung Disease  Program  Pulmonary [place] at [place], [place], [address]  Pager: [phone], If no answer or between  15:00h - 7:00h: call [phone] Telephone: [phone]

## 2017-08-19 NOTE — Progress Notes (Addendum)
Pt woke up very anxious, restless. Bipap taken off per request. Spent about 6 hours on bipap. O2 sats read 89-91% on 10L HFNC. RR 30. Pt currently resting in bed. Will cont to monitor pt.

## 2017-08-19 NOTE — Progress Notes (Signed)
Spent multiple meetings with the patient to convince her that intubation is coming and necessary.  After prolonged meetings the patient was ok with elective intubation since she was desaturating profoundly.  During intubation profoundly desaturated.  Cultures sent and abx placed.  ABG done and vent adjusted for ABG.    The patient is critically ill with multiple organ systems failure and requires high complexity decision making for assessment and support, frequent evaluation and titration of therapies, application of advanced monitoring technologies and extensive interpretation of multiple databases.   Critical Care Time devoted to patient care services described in this note is  60  Minutes. This time reflects time of care of this signee Dr [name]. This critical care time does not reflect procedure time, or teaching time or supervisory time of PA/NP/Med student/Med Resident etc but could involve care discussion time.  Rush Farmer, M.D. [facility] Pulmonary/Critical Care Medicine. Pager: [phone]. After hours pager: [phone].

## 2017-08-19 NOTE — Progress Notes (Signed)
Pt transferred to 2M03 via bed accompanied by 2 RNs and PT. Report called to the receiving RN Lauree Chandler.

## 2017-08-19 NOTE — Research (Deleted)
Title: A Randomized, Double-Blind, Placebo-Controlled Dose Ranging Study Evaluating the Safety Pharmacokinetics and Clinical Benefit of FLU-IGIV in Hospitalized Patients with Serious Influenza A infection. IA-001 (ClinicalTrials.gov Identifier: [id number], Protocol No: IA-001, [facility] Protocol #[id number])  RESEARCH SUBJECT. This research study is sponsored by [employer]   Protocol: amendment 4 -> 16 Dec 2016  The investigational product is called NP-025 aka FLU-IGIV or anti-influenza immune globulin intravenous. It is produced from source plasma collected from [place] Transport planner (FDA) licensed plasma collection establishments from healthy donors who have recovered from influenza (convalescent) and/or were vaccinated against seasonal influenza strains. The plasma contains a relatively high concentration of polyclonal antibodies directed against seasonal influenza strains, specifically influenza A strains H1N1 ([place]) and H3N2 ([place]). It is a glycoprotein of 150-160 kilodaltons against Hemagluttinin (HA) and Neuraminidase (NA) surface proteins.   26 Nov 2016 - first 20 subjects on this study - no safety signals identified  Clinical Research Nurse note: Prior to start of infusion, subject Cassie [name] was awake and alert. Cassie [name] denied headache or any other pain and nausea. Vital signs were obtained at 0736- BP 165/81, HR 125, RR 23, Sats 88. Dr. Chase Caller present for infusion start at 0749. Infusion started via 20G PIV at rate of 1.0 mL/min. Dr. Chase Caller remained present during the initial first 30 min of infusion.   Subject denies headache, any other pain, and nausea. IV site without complications.  Elevated temp of 103.2 noted at 0817. Subject had documented temp of 102.9 at midnight. Tylenol given by bedside nurse. Vital signs at 0822 were as follows: BP 165/103, HR 124, RR 32, Sats 92. Cassie [name] was noted to have increased work of  breathing. This is not believed to be related to the infusion. Subject placed on BiPap, as Cassie [name] had been overnight and infusion increased at 0832 by an additional 1.0 mL/min per protocol.   Subject denies headache, any other pain, and nausea. Vital signs at 0851 as follows: BP 158/87, HR 119, RR 34, Sats 94. Cassie [name] is lethargic but responsive to stimulation. Dr. Romilda Garret updated. Infusion increased at 0852 by an additional 1.0 mL/min per protocol.   Subject remains stable. Denies headache, any other pain, and nausea. Vital signs at 0912 BP 124/60, HR 115, RR 30, Sats 91. Cassie [name] remains lethargic on BiPap but responsive to stimulation. Infusion increased at 0913 by an additional 1.0 mL/min per protocol (max dose). Vital signs at 0930- 125/58, HR 107, RR 30, Sats 93. Updated Dr. Chase Caller.   Vital signs stable at 1000- BP 112/85, HR 105, RR 26, Sats 95. Subject more responsive at 1030. Vital signs- 128/65, HR 102, RR 29, Sats 92. Infusion complete at 1055. Subject denies headache, any other pain, and nausea. Cassie [name] is lethargic but responsive to voice. Vital signs at 1100- BP 129/64, HR 104, RR 29, Sats 90 on BiPap.

## 2017-08-19 NOTE — Progress Notes (Signed)
PCCM INTERVAL PROGRESS NOTE  Patient now intubated. ABG with mixed but primarily respiratory acidosis. Gas drawn on 6cc/kg, so vt increased. Peak pressures and plats high on 8cc. Will drop to 7 cc/kg and plan for deep sedation and neuromuscular blockade. If compliance/gas not improved with these measures may need to consider transfer to a facility with ECMO capabilities.    Georgann Housekeeper, AGACNP-BC [facility] Pulmonology/Critical Care Pager [phone] or [phone]  08/19/2017 6:47 PM

## 2017-08-19 NOTE — Procedures (Signed)
Intubation Procedure Note Cassie [name] [id number] 1966/06/22  Procedure: Intubation Indications: Respiratory insufficiency  Procedure Details Consent: Risks of procedure as well as the alternatives and risks of each were explained to the (patient/caregiver).  Consent for procedure obtained. Time Out: Verified patient identification, verified procedure, site/side was marked, verified correct patient position, special equipment/implants available, medications/allergies/relevent history reviewed, required imaging and test results available.  Performed  Maximum sterile technique was used including gloves, hand hygiene and mask.  MAC    Evaluation Hemodynamic Status: BP stable throughout; O2 sats: stable throughout Patient's Current Condition: stable Complications: No apparent complications Patient did tolerate procedure well. Chest X-ray ordered to verify placement.  CXR: pending.   Cassie [name] 08/19/2017

## 2017-08-20 ENCOUNTER — Inpatient Hospital Stay (HOSPITAL_COMMUNITY): Payer: Self-pay

## 2017-08-20 DIAGNOSIS — N179 Acute kidney failure, unspecified: Secondary | ICD-10-CM

## 2017-08-20 LAB — BASIC METABOLIC PANEL
ANION GAP: 9 (ref 5–15)
ANION GAP: 8 (ref 5–15)
BUN: 12 mg/dL (ref 6–20)
BUN: 18 mg/dL (ref 6–20)
CALCIUM: 7.7 mg/dL — AB (ref 8.9–10.3)
CHLORIDE: 97 mmol/L — AB (ref 101–111)
CO2: 24 mmol/L (ref 22–32)
CO2: 23 mmol/L (ref 22–32)
CREATININE: 1.45 mg/dL — AB (ref 0.44–1.00)
Calcium: 8 mg/dL — ABNORMAL LOW (ref 8.9–10.3)
Chloride: 101 mmol/L (ref 101–111)
Creatinine, Ser: 1.42 mg/dL — ABNORMAL HIGH (ref 0.44–1.00)
GFR calc Af Amer: 48 mL/min — ABNORMAL LOW (ref >60)
GFR, EST AFRICAN AMERICAN: 47 mL/min — AB (ref >60)
GFR, EST NON AFRICAN AMERICAN: 42 mL/min — AB (ref >60)
GFR, EST NON AFRICAN AMERICAN: 41 mL/min — AB (ref >60)
GLUCOSE: 169 mg/dL — AB (ref 65–99)
Glucose, Bld: 114 mg/dL — ABNORMAL HIGH (ref 65–99)
Potassium: 4.1 mmol/L (ref 3.5–5.1)
Potassium: 3.6 mmol/L (ref 3.5–5.1)
Sodium: 130 mmol/L — ABNORMAL LOW (ref 135–145)
Sodium: 132 mmol/L — ABNORMAL LOW (ref 135–145)

## 2017-08-20 LAB — POCT I-STAT 3, ART BLOOD GAS (G3+)
Bicarbonate: 25.7 mmol/L (ref 20.0–28.0)
O2 Saturation: 98 %
PCO2 ART: 48.7 mmHg — AB (ref 32.0–48.0)
PO2 ART: 112 mmHg — AB (ref 83.0–108.0)
Patient temperature: 101.8
TCO2: 27 mmol/L (ref 22–32)
pH, Arterial: 7.339 — ABNORMAL LOW (ref 7.350–7.450)

## 2017-08-20 LAB — URINALYSIS, ROUTINE W REFLEX MICROSCOPIC
BILIRUBIN URINE: NEGATIVE
Glucose, UA: 50 mg/dL — AB
KETONES UR: 5 mg/dL — AB
Leukocytes, UA: NEGATIVE
Nitrite: NEGATIVE
Protein, ur: 100 mg/dL — AB
Specific Gravity, Urine: 1.029 (ref 1.005–1.030)
pH: 5 (ref 5.0–8.0)

## 2017-08-20 LAB — CBC WITH DIFFERENTIAL/PLATELET
BASOS PCT: 1 %
BASOS PCT: 2 %
Basophils Absolute: 0.2 10*3/uL — ABNORMAL HIGH (ref 0.0–0.1)
Basophils Absolute: 0.2 10*3/uL — ABNORMAL HIGH (ref 0.0–0.1)
EOS ABS: 0 10*3/uL (ref 0.0–0.7)
EOS PCT: 0 %
Eosinophils Absolute: 0 10*3/uL (ref 0.0–0.7)
Eosinophils Relative: 0 %
HCT: 28.9 % — ABNORMAL LOW (ref 36.0–46.0)
HEMATOCRIT: 31.6 % — AB (ref 36.0–46.0)
HEMOGLOBIN: 9.8 g/dL — AB (ref 12.0–15.0)
Hemoglobin: 8.8 g/dL — ABNORMAL LOW (ref 12.0–15.0)
LYMPHS PCT: 11 %
Lymphocytes Relative: 16 %
Lymphs Abs: 1.7 10*3/uL (ref 0.7–4.0)
Lymphs Abs: 1.7 10*3/uL (ref 0.7–4.0)
MCH: 23.3 pg — ABNORMAL LOW (ref 26.0–34.0)
MCH: 22.8 pg — AB (ref 26.0–34.0)
MCHC: 31 g/dL (ref 30.0–36.0)
MCHC: 30.4 g/dL (ref 30.0–36.0)
MCV: 75.2 fL — AB (ref 78.0–100.0)
MCV: 74.9 fL — ABNORMAL LOW (ref 78.0–100.0)
MONO ABS: 0.4 10*3/uL (ref 0.1–1.0)
Monocytes Absolute: 0.6 10*3/uL (ref 0.1–1.0)
Monocytes Relative: 4 %
Monocytes Relative: 4 %
NEUTROS ABS: 8.3 10*3/uL — AB (ref 1.7–7.7)
NEUTROS PCT: 84 %
Neutro Abs: 12.7 10*3/uL — ABNORMAL HIGH (ref 1.7–7.7)
Neutrophils Relative %: 78 %
PLATELETS: 202 10*3/uL (ref 150–400)
PLATELETS: 129 10*3/uL — AB (ref 150–400)
RBC: 4.2 MIL/uL (ref 3.87–5.11)
RBC: 3.86 MIL/uL — ABNORMAL LOW (ref 3.87–5.11)
RDW: 18 % — ABNORMAL HIGH (ref 11.5–15.5)
RDW: 18.1 % — AB (ref 11.5–15.5)
WBC MORPHOLOGY: INCREASED
WBC: 15.2 10*3/uL — ABNORMAL HIGH (ref 4.0–10.5)
WBC: 10.6 10*3/uL — ABNORMAL HIGH (ref 4.0–10.5)

## 2017-08-20 LAB — COMPREHENSIVE METABOLIC PANEL
ALT: 24 U/L (ref 14–54)
ANION GAP: 9 (ref 5–15)
AST: 50 U/L — ABNORMAL HIGH (ref 15–41)
Albumin: 2 g/dL — ABNORMAL LOW (ref 3.5–5.0)
Alkaline Phosphatase: 63 U/L (ref 38–126)
BUN: 16 mg/dL (ref 6–20)
CHLORIDE: 102 mmol/L (ref 101–111)
CO2: 22 mmol/L (ref 22–32)
CREATININE: 1.61 mg/dL — AB (ref 0.44–1.00)
Calcium: 7.8 mg/dL — ABNORMAL LOW (ref 8.9–10.3)
GFR, EST AFRICAN AMERICAN: 41 mL/min — AB (ref >60)
GFR, EST NON AFRICAN AMERICAN: 36 mL/min — AB (ref >60)
Glucose, Bld: 125 mg/dL — ABNORMAL HIGH (ref 65–99)
POTASSIUM: 4 mmol/L (ref 3.5–5.1)
Sodium: 133 mmol/L — ABNORMAL LOW (ref 135–145)
Total Bilirubin: 0.8 mg/dL (ref 0.3–1.2)
Total Protein: 5.4 g/dL — ABNORMAL LOW (ref 6.5–8.1)

## 2017-08-20 LAB — GLUCOSE, CAPILLARY
GLUCOSE-CAPILLARY: 113 mg/dL — AB (ref 65–99)
Glucose-Capillary: 145 mg/dL — ABNORMAL HIGH (ref 65–99)
Glucose-Capillary: 97 mg/dL (ref 65–99)

## 2017-08-20 LAB — CORTISOL: CORTISOL PLASMA: 23.1 ug/dL

## 2017-08-20 LAB — BLOOD GAS, ARTERIAL
ACID-BASE EXCESS: 2 mmol/L (ref 0.0–2.0)
ACID-BASE EXCESS: 0.8 mmol/L (ref 0.0–2.0)
BICARBONATE: 27.7 mmol/L (ref 20.0–28.0)
Bicarbonate: 24.8 mmol/L (ref 20.0–28.0)
DRAWN BY: 252031
Drawn by: 41977
FIO2: 80
FIO2: 60
LHR: 35 {breaths}/min
LHR: 35 {breaths}/min
O2 SAT: 95.5 %
O2 Saturation: 93 %
PATIENT TEMPERATURE: 99.1
PCO2 ART: 58.7 mmHg — AB (ref 32.0–48.0)
PCO2 ART: 38.5 mmHg (ref 32.0–48.0)
PEEP/CPAP: 14 cmH2O
PEEP: 10 cmH2O
PH ART: 7.423 (ref 7.350–7.450)
PO2 ART: 74.4 mmHg — AB (ref 83.0–108.0)
Patient temperature: 97.7
VT: 350 mL
VT: 390 mL
pH, Arterial: 7.298 — ABNORMAL LOW (ref 7.350–7.450)
pO2, Arterial: 75.8 mmHg — ABNORMAL LOW (ref 83.0–108.0)

## 2017-08-20 LAB — PHOSPHORUS
PHOSPHORUS: 2.3 mg/dL — AB (ref 2.5–4.6)
Phosphorus: 2.9 mg/dL (ref 2.5–4.6)
Phosphorus: 2.2 mg/dL — ABNORMAL LOW (ref 2.5–4.6)

## 2017-08-20 LAB — LACTIC ACID, PLASMA: LACTIC ACID, VENOUS: 1.1 mmol/L (ref 0.5–1.9)

## 2017-08-20 LAB — PROTIME-INR
INR: 1.26
PROTHROMBIN TIME: 15.7 s — AB (ref 11.4–15.2)

## 2017-08-20 LAB — MAGNESIUM
MAGNESIUM: 2 mg/dL (ref 1.7–2.4)
MAGNESIUM: 2 mg/dL (ref 1.7–2.4)
Magnesium: 1.9 mg/dL (ref 1.7–2.4)

## 2017-08-20 LAB — APTT: APTT: 31 s (ref 24–36)

## 2017-08-20 LAB — PROCALCITONIN: Procalcitonin: 1.45 ng/mL

## 2017-08-20 MED ORDER — OSELTAMIVIR PHOSPHATE 6 MG/ML PO SUSR
75.0000 mg | Freq: Two times a day (BID) | ORAL | Status: DC
  Administered 2017-08-20: 75 mg
  Filled 2017-08-20 (×2): qty 12.5

## 2017-08-20 MED ORDER — SODIUM CHLORIDE 0.9 % IV BOLUS (SEPSIS)
1000.0000 mL | Freq: Once | INTRAVENOUS | Status: AC
  Administered 2017-08-20: 1000 mL via INTRAVENOUS

## 2017-08-20 MED ORDER — VANCOMYCIN HCL 10 G IV SOLR
2000.0000 mg | Freq: Once | INTRAVENOUS | Status: AC
  Administered 2017-08-20: 2000 mg via INTRAVENOUS
  Filled 2017-08-20: qty 2000

## 2017-08-20 MED ORDER — PIPERACILLIN-TAZOBACTAM 3.375 G IVPB 30 MIN
3.3750 g | Freq: Once | INTRAVENOUS | Status: DC
  Filled 2017-08-20: qty 50

## 2017-08-20 MED ORDER — VANCOMYCIN HCL IN DEXTROSE 1-5 GM/200ML-% IV SOLN
1000.0000 mg | Freq: Two times a day (BID) | INTRAVENOUS | Status: DC
  Filled 2017-08-20: qty 200

## 2017-08-20 MED ORDER — OSELTAMIVIR PHOSPHATE 6 MG/ML PO SUSR
150.0000 mg | Freq: Two times a day (BID) | ORAL | Status: DC
  Filled 2017-08-20: qty 25

## 2017-08-20 MED ORDER — INSULIN ASPART 100 UNIT/ML ~~LOC~~ SOLN
0.0000 [IU] | SUBCUTANEOUS | Status: DC
  Administered 2017-08-20: 2 [IU] via SUBCUTANEOUS
  Administered 2017-08-21: 3 [IU] via SUBCUTANEOUS
  Administered 2017-08-21 – 2017-08-23 (×11): 2 [IU] via SUBCUTANEOUS
  Administered 2017-08-23 (×2): 3 [IU] via SUBCUTANEOUS
  Administered 2017-08-23 – 2017-08-24 (×4): 2 [IU] via SUBCUTANEOUS
  Administered 2017-08-24: 3 [IU] via SUBCUTANEOUS
  Administered 2017-08-24: 2 [IU] via SUBCUTANEOUS
  Administered 2017-08-24: 3 [IU] via SUBCUTANEOUS
  Administered 2017-08-25: 2 [IU] via SUBCUTANEOUS
  Administered 2017-08-25 (×2): 3 [IU] via SUBCUTANEOUS
  Administered 2017-08-25 – 2017-08-26 (×3): 2 [IU] via SUBCUTANEOUS
  Administered 2017-08-26: 3 [IU] via SUBCUTANEOUS
  Administered 2017-08-26 – 2017-09-01 (×16): 2 [IU] via SUBCUTANEOUS
  Administered 2017-09-01 (×2): 3 [IU] via SUBCUTANEOUS
  Administered 2017-09-01 (×2): 2 [IU] via SUBCUTANEOUS
  Administered 2017-09-02: 3 [IU] via SUBCUTANEOUS
  Administered 2017-09-02 (×5): 2 [IU] via SUBCUTANEOUS
  Administered 2017-09-03: 3 [IU] via SUBCUTANEOUS
  Administered 2017-09-03: 2 [IU] via SUBCUTANEOUS
  Administered 2017-09-03: 3 [IU] via SUBCUTANEOUS
  Administered 2017-09-03 – 2017-09-04 (×4): 2 [IU] via SUBCUTANEOUS
  Administered 2017-09-04: 3 [IU] via SUBCUTANEOUS
  Administered 2017-09-04: 2 [IU] via SUBCUTANEOUS
  Administered 2017-09-05: 3 [IU] via SUBCUTANEOUS
  Administered 2017-09-05: 2 [IU] via SUBCUTANEOUS
  Administered 2017-09-05: 3 [IU] via SUBCUTANEOUS
  Administered 2017-09-05: 2 [IU] via SUBCUTANEOUS
  Administered 2017-09-05: 3 [IU] via SUBCUTANEOUS
  Administered 2017-09-06 (×3): 2 [IU] via SUBCUTANEOUS
  Administered 2017-09-06: 3 [IU] via SUBCUTANEOUS
  Administered 2017-09-06: 2 [IU] via SUBCUTANEOUS
  Administered 2017-09-06: 3 [IU] via SUBCUTANEOUS
  Administered 2017-09-07 (×3): 2 [IU] via SUBCUTANEOUS
  Administered 2017-09-07: 3 [IU] via SUBCUTANEOUS
  Administered 2017-09-07: 2 [IU] via SUBCUTANEOUS
  Administered 2017-09-08: 3 [IU] via SUBCUTANEOUS
  Administered 2017-09-08 (×2): 2 [IU] via SUBCUTANEOUS
  Administered 2017-09-08: 3 [IU] via SUBCUTANEOUS
  Administered 2017-09-08 – 2017-09-09 (×6): 2 [IU] via SUBCUTANEOUS
  Administered 2017-09-10: 5 [IU] via SUBCUTANEOUS
  Administered 2017-09-10 (×2): 2 [IU] via SUBCUTANEOUS

## 2017-08-20 MED ORDER — OSELTAMIVIR PHOSPHATE 6 MG/ML PO SUSR
30.0000 mg | Freq: Two times a day (BID) | ORAL | Status: DC
  Administered 2017-08-20 – 2017-08-21 (×2): 30 mg
  Filled 2017-08-20 (×2): qty 12.5

## 2017-08-20 MED ORDER — VITAL HIGH PROTEIN PO LIQD
1000.0000 mL | ORAL | Status: DC
  Administered 2017-08-20 – 2017-09-01 (×16): 1000 mL
  Filled 2017-08-20: qty 1000

## 2017-08-20 MED ORDER — PIPERACILLIN-TAZOBACTAM 3.375 G IVPB
3.3750 g | Freq: Three times a day (TID) | INTRAVENOUS | Status: DC
  Filled 2017-08-20: qty 50

## 2017-08-20 MED ORDER — VITAL HIGH PROTEIN PO LIQD
1000.0000 mL | ORAL | Status: DC
  Administered 2017-08-20: 1000 mL

## 2017-08-20 MED ORDER — VANCOMYCIN HCL 10 G IV SOLR
1250.0000 mg | INTRAVENOUS | Status: DC
  Administered 2017-08-21: 1250 mg via INTRAVENOUS
  Filled 2017-08-20: qty 1250

## 2017-08-20 MED ORDER — SODIUM CHLORIDE 0.9 % IV SOLN
2.0000 g | Freq: Two times a day (BID) | INTRAVENOUS | Status: DC
  Administered 2017-08-21: 2 g via INTRAVENOUS
  Filled 2017-08-20 (×2): qty 2

## 2017-08-20 MED ORDER — SODIUM CHLORIDE 0.9 % IV SOLN
2.0000 g | Freq: Three times a day (TID) | INTRAVENOUS | Status: DC
  Administered 2017-08-20 (×2): 2 g via INTRAVENOUS
  Filled 2017-08-20 (×2): qty 2

## 2017-08-20 MED ORDER — PRO-STAT SUGAR FREE PO LIQD
30.0000 mL | Freq: Two times a day (BID) | ORAL | Status: DC
  Administered 2017-08-20: 30 mL
  Filled 2017-08-20: qty 30

## 2017-08-20 NOTE — Progress Notes (Signed)
PULMONARY / CRITICAL CARE MEDICINE   Name: Cassie [name] MRN: [id number] DOB: April 04, 1966    ADMISSION DATE:  08/17/2017 CONSULTATION DATE:  08/19/2017  REFERRING MD:  Carolin Sicks  CHIEF COMPLAINT:  Dyspnea  HISTORY OF PRESENT ILLNESS:   52 y/o female with minimal past medical history admitted with ARDS from Influenza A. Received Tamiflu, currently enrolled in a phase 2 clinical trial for an influenza investigational agent.   SUBJECTIVE:  Intubated overnight Oliguric Oxygenation improved somewhat  VITAL SIGNS: BP (!) 108/59 (BP Location: Right Arm)   Pulse 89   Temp (!) 101.8 F (38.8 C) (Rectal)   Resp (!) 35   Ht 5\' 4"  (1.626 m)   Wt 232 lb 9.4 oz (105.5 kg)   LMP 08/17/2017 (Exact Date)   SpO2 99%   BMI 39.92 kg/m   HEMODYNAMICS: CVP:  [14 mmHg] 14 mmHg  VENTILATOR SETTINGS: Vent Mode: PRVC FiO2 (%):  [40 %-100 %] 80 % Set Rate:  [25 bmp-35 bmp] 35 bmp Vt Set:  [330 mL-440 mL] 390 mL PEEP:  [10 cmH20-16 cmH20] 14 cmH20 Plateau Pressure:  [29 cmH20-42 cmH20] 34 cmH20  INTAKE / OUTPUT: I/O last 3 completed shifts: In: 3080 [I.V.:1072.6; IV Piggyback:2007.3] Out: 7673 [Urine:3370]  PHYSICAL EXAMINATION:  General:  In bed on vent HENT: NCAT ETT in place PULM: Crackles, wheezes bilatearlly, vent supported breathing CV: Tachy, regular, no extremities warm and well perfused, no cyanosis GI: BS+, soft, nontender MSK: normal bulk and tone Derm: no edema in arms/legs Neuro: sedated, paralyzed on vent     LABS:  BMET Recent Labs  Lab 08/19/17 0414 08/19/17 2056 08/20/17 0325  NA 133* 132* 130*  K 4.3 4.5 4.1  CL 100* 98* 97*  CO2 21* 25 24  BUN 9 8 12   CREATININE 0.78 1.15* 1.42*  GLUCOSE 109* 132* 169*    Electrolytes Recent Labs  Lab 08/19/17 0414 08/19/17 2056 08/20/17 0325  CALCIUM 8.6* 7.9* 8.0*  MG 1.8  --  1.9  PHOS 2.3*  --  2.9    CBC Recent Labs  Lab 08/19/17 0414 08/19/17 2056 08/20/17 0325  WBC 6.4 8.8 15.2*  HGB  11.5* 9.4* 9.8*  HCT 36.7 30.4* 31.6*  PLT 148* 192 202    Coag's Recent Labs  Lab 08/18/17 1613  APTT 37*  INR 1.10    Sepsis Markers Recent Labs  Lab 08/17/17 1855 08/17/17 2049 08/18/17 0756 08/20/17 0317 08/20/17 0325  LATICACIDVEN 1.99*  --   --  1.1  --   PROCALCITON  --  0.34 0.33  --  1.45    ABG Recent Labs  Lab 08/19/17 2350 08/20/17 0345 08/20/17 0633  PHART 7.362 7.298* 7.339*  PCO2ART 48.1* 58.7* 48.7*  PO2ART 80.2* 74.4* 112.0*    Liver Enzymes Recent Labs  Lab 08/17/17 1841 08/19/17 0414 08/19/17 2056  AST 85* 82* 69*  ALT 30 33 27  ALKPHOS 84 81 69  BILITOT 1.3* 0.4 0.5  ALBUMIN 2.9* 2.9* 2.2*    Cardiac Enzymes No results for input(s): TROPONINI, PROBNP in the last 168 hours.  Glucose Recent Labs  Lab 08/19/17 1421  GLUCAP 115*    Imaging Dg Chest Port 1 View  Result Date: 08/20/2017 CLINICAL DATA:  Patient on ventilator. EXAM: PORTABLE CHEST 1 VIEW COMPARISON:  August 19, 2017 FINDINGS: The ETT is in good position. The NG tube terminates below today's film. The right IJ is stable. No pneumothorax. Bilateral pulmonary infiltrates remain with slight interval improvement. No other changes. IMPRESSION:  1. Stable support apparatus. 2. Bilateral pulmonary infiltrates remain with minimal interval improvement. Electronically Signed   By: Dorise Bullion III M.D   On: 08/20/2017 07:27   Dg Chest Port 1 View  Result Date: 08/19/2017 CLINICAL DATA:  Central line placement. EXAM: PORTABLE CHEST 1 VIEW COMPARISON:  08/19/2017 FINDINGS: 1947 hours endotracheal tube tip is 2.9 cm above the base of the carina. The NG tube passes into the stomach although the distal tip position is not included on the film. Right IJ central line is new in the interval with the tip overlying the mid SVC level. No right pneumothorax. Cardiopericardial silhouette appears upper normal to enlarged. There is apparent interval progression of bilateral patchy airspace  disease, potentially accentuated by the lower lung volumes on current study. IMPRESSION: Right IJ central line tip overlies expected location of the mid SVC. No pneumothorax. More pronounced bilateral diffuse airspace opacity potentially accentuated by the lower volume film. Electronically Signed   By: Misty Stanley M.D.   On: 08/19/2017 20:19   Dg Chest Portable 1 View  Result Date: 08/19/2017 CLINICAL DATA:  Status post intubation today.  Multiorgan failure. EXAM: PORTABLE CHEST 1 VIEW COMPARISON:  Single-view of the chest earlier today. FINDINGS: New endotracheal tube is in place with the tip in good position just below the clavicular heads. New NG tube is looped in the stomach with the tip projecting below the inferior margin of the film. Diffuse bilateral airspace disease is worse on the left and unchanged. Heart size is upper normal. No pneumothorax or pleural effusion. IMPRESSION: ETT and NG tube projecting good position. No change in extensive bilateral airspace disease. Electronically Signed   By: Inge Rise M.D.   On: 08/19/2017 17:50   Dg Abd Portable 1v  Result Date: 08/19/2017 CLINICAL DATA:  Status post NG tube placement today. EXAM: PORTABLE ABDOMEN - 1 VIEW COMPARISON:  None. FINDINGS: NG tube is looped in the stomach with the tip projecting in the mid to distal body. Bowel gas pattern is nonobstructive. Mild gaseous prominence of small bowel loops may be due to ileus. IMPRESSION: NG tube in good position. Mild gaseous distention of small and large bowel suggestive of ileus. Electronically Signed   By: Inge Rise M.D.   On: 08/19/2017 17:52     STUDIES:  1/25 Echo: LVEF 55-60%, Grade 2 DD, mild left atrial dilation  CULTURES: 1/26 resp >  1/23 blood >  1/23 RSVP > positive for FLU A H1 2009  ANTIBIOTICS: 1/25 vanc >  1/23 azithro >  1/23 ceftriaxone > 1/25 1/25 meropenem >  1/23 tamiflu >   SIGNIFICANT EVENTS: 1/23 admission 1/25  intubation  LINES/TUBES: 1/25 ETT >  1/25 R IJ CVL>  1/25 L radial arterial line > 1/25  DISCUSSION: 51 y/o female with ARDS from severe community acquired pneumonia Influenza A.  Possible bacterial superinfection based on multi-lobar infiltrates.  Now with worsening multi-organ failure.  Oxygenation somewhat improved 1/26.  ASSESSMENT / PLAN:  PULMONARY A: ARDS > improved oxygenation overnight P:   Full mechanical vent support > ARDS protocol VAP prevention Daily WUA/SBT Diurese when able (off vasopressors) Continue nimbex per protocol, plan 48 hours No plans to prone today if A-a gradient improved today compared to yesterday Monitor ABG closely No clear data to support using ECMO at this point with improved paO2 1/26  CARDIOVASCULAR A:  Septic shock CVP 11> with high itrathoracic pressure I believe she is intravascularly dry P:  Continue levophed for MAP >  65 Monitor CVP Tele Monitor hemodynamics Bolus saline now  RENAL A:   AKI> worsening, somewhat oliguric U/A with pyuria, hematuria blood> trauma from foley? P:   Repeat U/A  Give normal saline bolus now Monitor BMET and UOP Replace electrolytes as needed   GASTROINTESTINAL A:   No acute issues P:   Continue tube feeding Pantoprazole for stress ulcer prophylaxis  HEMATOLOGIC A:   Anemia wihtout bleeding  P:  Monitor for bleeding Transfuse for Hgb < 7gm/dL  INFECTIOUS A:   Severe CAP from Influenza A Bacterial superinfection?  P:   Follow up trach aspirate cultures Continue current antimicrobials for now but favor narrowing if cultures are negative  ENDOCRINE A:   Mild hyperglycemia   P:   SSI  NEUROLOGIC A:   Sedation/paralytic need for vent synchrony P:   RASS goal: -5 Continue PAD protocol, NIMBEX protocol for 1/26, likely stop tomorrow   FAMILY  - Updates: called her boyfriend Josh Pardue 08/20/2017@10 :15 AM > no answer  - Inter-disciplinary family meet or Palliative Care  meeting due by:  day 7  My cc time 40 minutes  Roselie Awkward, MD [place] PCCM Pager: [phone] Cell: [phone] After 3pm or if no response, call [phone]    08/20/2017, 9:10 AM

## 2017-08-20 NOTE — Progress Notes (Signed)
Pharmacy Antibiotic Note  Cassie [name] is a 52 y.o. female admitted on 08/17/2017 with respiratory distress. RVP positive for Influenza A - pt critically ill and intubated, also has ARDS and is paralyzed. She is actively enrolled in flu-IVIG study - received the dose yesterday morning. Planning to broaden antibiotics due to worsening clinical status.   Plan: -Vancomycin 2 g IV x1 then 1g/12h -Meropenem 2 g IV q8h -Monitor renal fx, cultures, VT as needed   Height: 5\' 4"  (162.6 cm) Weight: 226 lb (102.5 kg) IBW/kg (Calculated) : 54.7  Temp (24hrs), Avg:100.8 F (38.2 C), Min:99 F (37.2 C), Max:103.2 F (39.6 C)  Recent Labs  Lab 08/17/17 1841 08/17/17 1855 08/18/17 0756 08/19/17 0414 08/19/17 2056  WBC 5.2  --  5.6 6.4 8.8  CREATININE 1.03*  --  1.16* 0.78 1.15*  LATICACIDVEN  --  1.99*  --   --   --     Estimated Creatinine Clearance: 66.7 mL/min (A) (by C-G formula based on SCr of 1.15 mg/dL (H)).    Allergies  Allergen Reactions  . Sulfa Antibiotics     Antimicrobials this admission: 1/23 ceftriaxone > 1/26 1/23 azithromycin > 1/24 tamiflu > 1/26 meropenem > 1/26 vancomycin >   Dose adjustments this admission: N/A   Microbiology results: 1/23 RVP: Influenza A 1/23 Blood cx: ngtd 1/25 mrsa pcr: neg 1/26 resp cx:    Harvel Quale 08/20/2017 3:48 AM

## 2017-08-20 NOTE — Progress Notes (Signed)
Initial Nutrition Assessment  DOCUMENTATION CODES:  Obesity unspecified  INTERVENTION:  Increase Vital High Protein via OGT to 55 goal rate of 55cc/hr (1320 ml per day) and D/C Prostat. Provides 1320 kcals, 116g Pro  1104 ml free water daily.  NUTRITION DIAGNOSIS:  Inadequate oral intake related to inability to eat as evidenced by NPO status.  GOAL:  Provide needs based on ASPEN/SCCM guidelines  MONITOR:  Vent status, Diet advancement, Labs, Weight trends, TF tolerance  REASON FOR ASSESSMENT:  Consult Enteral/tube feeding initiation and management  ASSESSMENT:  52 y/o female PMHx hypothyroidism. Presented w/ SOB/Cough x5 days as well as fever/chills. Worked up for acute respiratory failure w/ hypoxia d/t multifocal PNA and sepsis. Respiratory status continued to decline, developed ARDS and ultimately noninvasive measures failed and was intubated 1/25. Influenza A+. RD consulted for TF.   Patient intubated, paralyzed for vent synchrony. Unable to communicate. No historians present.   Per chart, Pt did have documented PO intake on morning of 1/24. Has gone ~48 hrs w/o nutrition.   Pt's admit measured wt was 226.8 lbs (103.1 kg)->dry weight. There is no prior wt history to compare wt with.   Physical Exam: obese. No wasting/edema.   Patient is currently intubated on ventilator support MV: 13.0 L/min Temp (24hrs), Avg:100.8 F (38.2 C), Min:98.9 F (37.2 C), Max:102.9 F (39.4 C)  Meds: Pressor Support: Levophed. Nimbex, fentanyl, versed, insulin, PPI, Tamilflu, IV abx, IVF Labs: Phos:2.3, Albumin:2.0, WBC:10.6, Creat trending up: 1.61, BGs 110-170.   NUTRITION - FOCUSED PHYSICAL EXAM: WDL  Diet Order:  Diet NPO time specified  EDUCATION NEEDS:  No education needs have been identified at this time  Skin:  Skin Assessment: Reviewed RN Assessment  Last BM:  Unknown  Height:  Ht Readings from Last 1 Encounters:  08/17/17 5\' 4"  (1.626 m)   Weight:  Wt Readings from  Last 1 Encounters:  08/20/17 232 lb 9.4 oz (105.5 kg)   Ideal Body Weight:  54.54 kg  BMI:  Body mass index is 39.92 kg/m.  Estimated Nutritional Needs:  Kcal:  1135-1445 kcal (11-14 kcal/kg bw) Protein:  >110g Pro (2g/kg ibw) Fluid:  PER MD GOALS  Burtis Junes RD, LDN, CNSC Clinical Nutrition Pager: [phone] 08/20/2017 1:36 PM;

## 2017-08-20 NOTE — Research (Signed)
Title: A Randomized, Double-Blind, Placebo-Controlled Dose Ranging Study Evaluating the Safety Pharmacokinetics and Clinical Benefit of FLU-IGIV in Hospitalized Patients with Serious Influenza A infection. IA-001 (ClinicalTrials.gov Identifier: [id number], Protocol No: IA-001, [facility] Protocol #[id number])  RESEARCH SUBJECT. This research study is sponsored by [employer]   Protocol: amendment 4 -> 16 Dec 2016  The investigational product is called NP-025 aka FLU-IGIV or anti-influenza immune globulin intravenous. It is produced from source plasma collected from [place] Transport planner (FDA) licensed plasma collection establishments from healthy donors who have recovered from influenza (convalescent) and/or were vaccinated against seasonal influenza strains. The plasma contains a relatively high concentration of polyclonal antibodies directed against seasonal influenza strains, specifically influenza A strains H1N1 ([place]) and H3N2 ([place]). It is a glycoprotein of 150-160 kilodaltons against Hemagluttinin (HA) and Neuraminidase (NA) surface proteins.  ...................................................................................................  Clinical Research Coordinator / Research RN note : This visit for Subject Cassie [name] DOB: Apr 08, 1967on 01/26/2019for the above protocol is Visit/Encounter # Day 2 and is for purpose of post infusion assessments/procedures. Patient is located in intensive care unit at [facility] and was intubated overnight. Per ICU nurse the subject is in medically induced paralysis due to the intubation so unable to assess flu symptoms at this time.  PK sample drawn at 11:19 and NP swab obtained at 11:45. Local labs drawn at 11:47. Patient was given paralysis medication prior to study team arrival so the subject was unresponsive. Because patient is not  Communicative, flu symptoms assessed by  this coordinator and ICU nurse Melanie. At time of symptom assessment the subject did not have a fever, and unable to assess other symptoms due to paralysis. New score assessed at 11:44 with total score of 6. Refer to the subjects paper source binder for further documentation of assessments and procedures.  Also refer to PI Dr. Brand Males progress note dated 08/20/2017 for AE assessments.   [place] Bing, [place], [facility] Certified [place] Coordinator [place], [place] [phone]

## 2017-08-20 NOTE — Progress Notes (Signed)
Patient clinically worsening overnight, now on high vent settings with ARDS low tidal volume lung protective ventilation, also on Paralytic infusion. Repeat ABG improved on paralytic. If worsens again will consider proning. Continued to be febrile; has been receiving the PRN Tylenol. Will start cooling blanket PRN for fever. Blood cultures remain no growth but do not see any sputum culture ordered. Will order one now. I am concerned Cassie [name] may have a superimposed bacterial infection on top of her influenza given the time-frame of worsening, now with ARDS. Will broaden antibiotics from Ceftriaxone/Azithro to Vanc/Meropenem. Trend lactate. Cassie [name] is in septic shock currently on Levophed @ 72mcg so cannot diurese. Will check a cortisol level. Lastly, there was some evidence from the H1N1 flu pandemic that in critically ill patients they would benefit from high dose tamiflu. Will increase her tamiflu from 75mg  to 150mg  BID based on that evidence (although not strong evidence but see little risk and possible benefit). Cassie [name] remains gravely ill. Will monitor closely and re-eval need for proning.

## 2017-08-20 NOTE — Progress Notes (Signed)
Pharmacy Antibiotic Note  Cassie [name] is a 52 y.o. female admitted on 08/17/2017 with respiratory distress. RVP positive for Influenza A - pt critically ill and intubated, also has ARDS and is paralyzed. She is actively enrolled in flu-IVIG study.  Pt with Tmax 103.2 and WBC is minimally elevated at 10.6. SCr is increasing even from this AM labs (1.42 at 0325 and 1.61 at 1142).   Plan: Change vancomycin to 1250mg  IV Q24H Change meropenem to 2gm IV Q12H Renally adjust tamiflu to 30mg  PO BID F/u renal fxn, C&S, clinical status and trough at SS  Height: 5\' 4"  (162.6 cm) Weight: 232 lb 9.4 oz (105.5 kg) IBW/kg (Calculated) : 54.7  Temp (24hrs), Avg:100.8 F (38.2 C), Min:98.9 F (37.2 C), Max:102.9 F (39.4 C)  Recent Labs  Lab 08/17/17 1855 08/18/17 0756 08/19/17 0414 08/19/17 2056 08/20/17 0317 08/20/17 0325 08/20/17 1142  WBC  --  5.6 6.4 8.8  --  15.2* 10.6*  CREATININE  --  1.16* 0.78 1.15*  --  1.42* 1.61*  LATICACIDVEN 1.99*  --   --   --  1.1  --   --     Estimated Creatinine Clearance: 48.4 mL/min (A) (by C-G formula based on SCr of 1.61 mg/dL (H)).    Allergies  Allergen Reactions  . Sulfa Antibiotics     Antimicrobials this admission: 1/23 ceftriaxone > 1/26 1/23 azithromycin > 1/24 tamiflu > 1/26 meropenem > 1/26 vancomycin >   Dose adjustments this admission: N/A   Microbiology results: 1/23 RVP: Influenza A 1/23 Blood cx: ngtd 1/25 mrsa pcr: neg 1/26 resp cx:    Alford Highland 08/20/2017 1:33 PM

## 2017-08-21 ENCOUNTER — Inpatient Hospital Stay (HOSPITAL_COMMUNITY): Payer: Self-pay

## 2017-08-21 LAB — BASIC METABOLIC PANEL
ANION GAP: 7 (ref 5–15)
BUN: 23 mg/dL — ABNORMAL HIGH (ref 6–20)
CALCIUM: 8.4 mg/dL — AB (ref 8.9–10.3)
CHLORIDE: 104 mmol/L (ref 101–111)
CO2: 23 mmol/L (ref 22–32)
Creatinine, Ser: 1.25 mg/dL — ABNORMAL HIGH (ref 0.44–1.00)
GFR calc non Af Amer: 49 mL/min — ABNORMAL LOW (ref >60)
GFR, EST AFRICAN AMERICAN: 56 mL/min — AB (ref >60)
Glucose, Bld: 129 mg/dL — ABNORMAL HIGH (ref 65–99)
Potassium: 4.1 mmol/L (ref 3.5–5.1)
Sodium: 134 mmol/L — ABNORMAL LOW (ref 135–145)

## 2017-08-21 LAB — GLUCOSE, CAPILLARY
GLUCOSE-CAPILLARY: 109 mg/dL — AB (ref 65–99)
GLUCOSE-CAPILLARY: 112 mg/dL — AB (ref 65–99)
GLUCOSE-CAPILLARY: 119 mg/dL — AB (ref 65–99)
GLUCOSE-CAPILLARY: 122 mg/dL — AB (ref 65–99)
GLUCOSE-CAPILLARY: 159 mg/dL — AB (ref 65–99)
Glucose-Capillary: 134 mg/dL — ABNORMAL HIGH (ref 65–99)
Glucose-Capillary: 140 mg/dL — ABNORMAL HIGH (ref 65–99)

## 2017-08-21 LAB — BLOOD GAS, ARTERIAL
ACID-BASE EXCESS: 0.2 mmol/L (ref 0.0–2.0)
BICARBONATE: 24.8 mmol/L (ref 20.0–28.0)
Drawn by: 25231
FIO2: 60
LHR: 35 {breaths}/min
O2 SAT: 95.4 %
PATIENT TEMPERATURE: 98
PEEP: 10 cmH2O
PO2 ART: 81.4 mmHg — AB (ref 83.0–108.0)
VT: 390 mL
pCO2 arterial: 43.3 mmHg (ref 32.0–48.0)
pH, Arterial: 7.375 (ref 7.350–7.450)

## 2017-08-21 LAB — CBC WITH DIFFERENTIAL/PLATELET
BASOS PCT: 1 %
Basophils Absolute: 0.1 10*3/uL (ref 0.0–0.1)
EOS PCT: 0 %
Eosinophils Absolute: 0 10*3/uL (ref 0.0–0.7)
HEMATOCRIT: 27.2 % — AB (ref 36.0–46.0)
HEMOGLOBIN: 8.4 g/dL — AB (ref 12.0–15.0)
Lymphocytes Relative: 14 %
Lymphs Abs: 1.3 10*3/uL (ref 0.7–4.0)
MCH: 22.8 pg — ABNORMAL LOW (ref 26.0–34.0)
MCHC: 30.9 g/dL (ref 30.0–36.0)
MCV: 73.7 fL — AB (ref 78.0–100.0)
Monocytes Absolute: 0.5 10*3/uL (ref 0.1–1.0)
Monocytes Relative: 5 %
NEUTROS PCT: 80 %
Neutro Abs: 7.5 10*3/uL (ref 1.7–7.7)
Platelets: 153 10*3/uL (ref 150–400)
RBC: 3.69 MIL/uL — AB (ref 3.87–5.11)
RDW: 18.1 % — ABNORMAL HIGH (ref 11.5–15.5)
WBC: 9.4 10*3/uL (ref 4.0–10.5)

## 2017-08-21 LAB — PHOSPHORUS: PHOSPHORUS: 3.3 mg/dL (ref 2.5–4.6)

## 2017-08-21 LAB — MAGNESIUM: Magnesium: 2.1 mg/dL (ref 1.7–2.4)

## 2017-08-21 MED ORDER — SODIUM CHLORIDE 0.9 % IV SOLN
2.0000 g | Freq: Three times a day (TID) | INTRAVENOUS | Status: DC
  Administered 2017-08-21 – 2017-08-25 (×13): 2 g via INTRAVENOUS
  Filled 2017-08-21 (×16): qty 2

## 2017-08-21 MED ORDER — OSELTAMIVIR PHOSPHATE 6 MG/ML PO SUSR
75.0000 mg | Freq: Two times a day (BID) | ORAL | Status: AC
  Administered 2017-08-21 – 2017-08-22 (×3): 75 mg
  Filled 2017-08-21 (×3): qty 12.5

## 2017-08-21 MED ORDER — FUROSEMIDE 10 MG/ML IJ SOLN
60.0000 mg | Freq: Once | INTRAMUSCULAR | Status: AC
  Administered 2017-08-21: 60 mg via INTRAVENOUS
  Filled 2017-08-21: qty 6

## 2017-08-21 NOTE — Progress Notes (Signed)
PULMONARY / CRITICAL CARE MEDICINE   Name: Cassie [name] MRN: [id number] DOB: August 08, 1965    ADMISSION DATE:  08/17/2017 CONSULTATION DATE:  08/19/2017  REFERRING MD:  Carolin Sicks  CHIEF COMPLAINT:  Dyspnea  HISTORY OF PRESENT ILLNESS:   52 y/o female with minimal past medical history admitted with ARDS from Influenza A. Received Tamiflu, currently enrolled in a phase 2 clinical trial for an influenza investigational agent. She was intubated for airway support and respiratory distress (ARDS) on 1/25.   SUBJECTIVE:  Oxygenation improving Urine output improved over last 24 hours  VITAL SIGNS: BP 127/64   Pulse 75   Temp 98.1 F (36.7 C) (Rectal)   Resp (!) 35   Ht 5\' 4"  (1.626 m)   Wt 244 lb 14.9 oz (111.1 kg)   LMP 08/17/2017 (Exact Date)   SpO2 95%   BMI 42.04 kg/m   HEMODYNAMICS: CVP:  [8 mmHg-12 mmHg] 8 mmHg  VENTILATOR SETTINGS: Vent Mode: PRVC FiO2 (%):  [50 %-80 %] 60 % Set Rate:  [35 bmp] 35 bmp Vt Set:  [390 mL] 390 mL PEEP:  [10 cmH20-14 cmH20] 10 cmH20 Plateau Pressure:  [32 cmH20-34 cmH20] 32 cmH20  INTAKE / OUTPUT: I/O last 3 completed shifts: In: 5548.5 [I.V.:2799.8; NG/GT:1148.7; IV Piggyback:1600] Out: 800 [Urine:800]  PHYSICAL EXAMINATION: General: Intubated, sedated and paralized Neurological: unresponsive to all commands as expected  HEENT: PER and unreactive to light, MM moist Cardiovascular: S1-2 difficult to auscultate, RRR, no murmur auscultated  Pulmonary: Mild inspiratory and expiratory wheezing bilaterally more prominently in the left lung field, no crackles  GI: Bowel sound present, nondistended  Musculoskeletal: +1 pitting edema bilateral distal extremities  Skin: no rash or ecchymosis observed  LABS: BMET Recent Labs  Lab 08/20/17 1142 08/20/17 1810 08/21/17 0351  NA 133* 132* 134*  K 4.0 3.6 4.1  CL 102 101 104  CO2 22 23 23   BUN 16 18 23*  CREATININE 1.61* 1.45* 1.25*  GLUCOSE 125* 114* 129*   Electrolytes Recent  Labs  Lab 08/20/17 0325 08/20/17 1025 08/20/17 1142 08/20/17 1649 08/20/17 1810 08/21/17 0351  CALCIUM 8.0*  --  7.8*  --  7.7* 8.4*  MG 1.9 2.0  --  2.0  --   --   PHOS 2.9 2.3*  --  2.2*  --   --    CBC Recent Labs  Lab 08/20/17 0325 08/20/17 1142 08/21/17 0351  WBC 15.2* 10.6* 9.4  HGB 9.8* 8.8* 8.4*  HCT 31.6* 28.9* 27.2*  PLT 202 129* 153   Coag's Recent Labs  Lab 08/18/17 1613 08/20/17 1142  APTT 37* 31  INR 1.10 1.26   Sepsis Markers Recent Labs  Lab 08/17/17 1855 08/17/17 2049 08/18/17 0756 08/20/17 0317 08/20/17 0325  LATICACIDVEN 1.99*  --   --  1.1  --   PROCALCITON  --  0.34 0.33  --  1.45   ABG Recent Labs  Lab 08/20/17 0633 08/20/17 1935 08/21/17 0357  PHART 7.339* 7.423 7.375  PCO2ART 48.7* 38.5 43.3  PO2ART 112.0* 75.8* 81.4*   Liver Enzymes Recent Labs  Lab 08/19/17 0414 08/19/17 2056 08/20/17 1142  AST 82* 69* 50*  ALT 33 27 24  ALKPHOS 81 69 63  BILITOT 0.4 0.5 0.8  ALBUMIN 2.9* 2.2* 2.0*   Cardiac Enzymes No results for input(s): TROPONINI, PROBNP in the last 168 hours.  Glucose Recent Labs  Lab 08/19/17 1421 08/20/17 1154 08/20/17 1621 08/20/17 1935 08/21/17 0009 08/21/17 0311  GLUCAP 115* 113* 145* 97 159*  122*   Imaging No results found.  STUDIES:  1/25 Echo: LVEF 55-60%, Grade 2 DD, mild left atrial dilation  CULTURES: 1/26 resp >  1/23 blood >  1/23 RSVP > positive for FLU A H1 2009  ANTIBIOTICS: 1/25 vanc >>>  1/23 azithro >>>  1/23 ceftriaxone >>> 1/25 1/25 meropenem >>>  1/23 tamiflu >>>  SIGNIFICANT EVENTS: 1/23 admission 1/25 intubation  LINES/TUBES: 1/25 ETT >>>  1/25 R IJ CVL>>>  1/25 L radial arterial line >>> 1/25  DISCUSSION: 52 y/o female with ARDS from severe community acquired pneumonia Influenza A.  Possible bacterial superinfection based on multi-lobar infiltrates.  Now with worsening multi-organ failure.  Oxygenation somewhat improved 1/26.  ASSESSMENT /  PLAN:  PULMONARY A: ARDS > improved oxygenation following intubation  ABG 7.375/43/81/25 P:   Full mechanical vent support > ARDS protocol VAP prevention Daily WUA/SBT Diurese when able (off vasopressors) Continue nimbex per protocol, plan 48 hours (Until 2023 hours 1/27) No plans to prone today if A-a gradient improved today compared to yesterday Monitor ABG closely  CARDIOVASCULAR A:  Septic shock CVP 11> with high itrathoracic pressure I believe she is intravascularly dry P:  Continue levophed for MAP > 65 Monitor CVP Tele Monitor hemodynamics  RENAL A:   AKI> improving, urine output improved U/A Mucus, Hyaline-granular casts, Amorphus crystals present, 6-30 RBC's P:   Repeat U/A  Monitor BMET and UOP Replace electrolytes as needed  GASTROINTESTINAL A:   No acute issues P:   Continue tube feeding Pantoprazole for stress ulcer prophylaxis  HEMATOLOGIC A:   Anemia without bleeding, Hgb stable P:  Monitor for bleeding Transfuse for Hgb < 7gm/dL  INFECTIOUS A:   Severe CAP from Influenza A Bacterial superinfection?  Leukocytosis resolved  P:   Follow up trach aspirate cultures as above Continue current antimicrobials for now but favor narrowing if cultures are negative  ENDOCRINE A:   Mild hyperglycemia   P:   SSI  NEUROLOGIC A:   Sedation/paralytic need for vent synchrony P:   RASS goal: -5 Continue PAD protocol, NIMBEX protocol for 1/26, likely stop tomorrow  FAMILY  - Updates: called her boyfriend Josh Pardue 08/21/2017@7 :11 AM > no answer  - Inter-disciplinary family meet or Palliative Care meeting due by:  day 7  Kathi Ludwig, MD Resident PCCM Physician Pager #: [phone]

## 2017-08-21 NOTE — Progress Notes (Signed)
RN called d/t pt continuing to desaturate.  Recruitment maneuver performed x 2 minutes.  S/p recruitment maneuver performed, pt began to desaturate again.  fio2 and peep increased to 70% and 12 peep, then increased to 80% and 14 peep.  Dr. [place] Bells made aware.

## 2017-08-21 NOTE — Research (Signed)
Title: A Randomized, Double-Blind, Placebo-Controlled Dose Ranging Study Evaluating the Safety Pharmacokinetics and Clinical Benefit of FLU-IGIV in Hospitalized Patients with Serious Influenza A infection. IA-001 (ClinicalTrials.gov Identifier: [id number], Protocol No: IA-001, [facility] Protocol #[id number])  RESEARCH SUBJECT. This research study is sponsored by [employer]   Protocol: amendment 4 -> 16 Dec 2016  The investigational product is called NP-025 aka FLU-IGIV or anti-influenza immune globulin intravenous. It is produced from source plasma collected from [place] Transport planner (FDA) licensed plasma collection establishments from healthy donors who have recovered from influenza (convalescent) and/or were vaccinated against seasonal influenza strains. The plasma contains a relatively high concentration of polyclonal antibodies directed against seasonal influenza strains, specifically influenza A strains H1N1 ([place], [place]) and H3N2 ([place]). It is a glycoprotein of 150-160 kilodaltons against Hemagluttinin (HA) and Neuraminidase (NA) surface proteins.  ................................................................................................... Clinical Research Coordinator / Research RN note : This visit for Subject Kalya Troeger DOB: 23-Dec-1967on 01/27/2019for the above protocol is Visit/Encounter #Day 3and is for purpose of post infusion assessments/procedures. Patient is located in intensive care unit at [facility] and is currently on the ventilator. Per ICU nurse the subject is in medically induced paralysis due to the intubation so unable to assess flu symptoms at this time.  PK sample drawn at 11:47and NP swab obtained at 12:10. Patient was given paralysis medication prior to study team arrival so the subject is unresponsive at the time of this visit. . New score assessed at 12:04 with total score of 11.  Refer  to the subjects paper source binder for further documentation of assessments and procedures.  AE: Mild Hyperglycemia, per PI not related to study medication, mild in severity, and ongoing at this time. Leukocytosis- resolved per lab results 27/Jan/19 at 0351.  SAE: AKI-this has slightly improved but is still ongoing at this time ARDS-ongoing Circulatory Shock-Ongoing  Attempted to contact LAR, Josh Pardue, at approx 12:00 noon. No answer, left message to call research office. It is noted by the [facility] doctor that he also attempted to contact LAR without success.   [place] Bing, [place], [facility] Certified [place] Coordinator [place], [place] [phone]

## 2017-08-21 NOTE — Progress Notes (Signed)
Pharmacy Antibiotic Note  Cassie [name] is a 52 y.o. female admitted on 08/17/2017 with respiratory distress. RVP positive for Influenza A - pt critically ill and intubated, also has ARDS and is paralyzed. She is actively enrolled in flu-IVIG study. Pt now afebrile and WBC is WNL. Scr improved and now back down to 1.25.  Plan: Change meropenem to 2gm IV Q8H Renally adjust tamiflu back to 75mg  PO BID F/u renal fxn, C&S, clinical status  Height: 5\' 4"  (162.6 cm) Weight: 244 lb 14.9 oz (111.1 kg) IBW/kg (Calculated) : 54.7  Temp (24hrs), Avg:98.1 F (36.7 C), Min:97.7 F (36.5 C), Max:99.9 F (37.7 C)  Recent Labs  Lab 08/17/17 1855  08/19/17 0414 08/19/17 2056 08/20/17 0317 08/20/17 0325 08/20/17 1142 08/20/17 1810 08/21/17 0351  WBC  --    < > 6.4 8.8  --  15.2* 10.6*  --  9.4  CREATININE  --    < > 0.78 1.15*  --  1.42* 1.61* 1.45* 1.25*  LATICACIDVEN 1.99*  --   --   --  1.1  --   --   --   --    < > = values in this interval not displayed.    Estimated Creatinine Clearance: 64.2 mL/min (A) (by C-G formula based on SCr of 1.25 mg/dL (H)).    Allergies  Allergen Reactions  . Sulfa Antibiotics     Antimicrobials this admission: 1/23 ceftriaxone > 1/26 1/23 azithromycin >1/27 1/24 tamiflu > 1/26 meropenem > 1/26 vancomycin >1/27  Microbiology results: 1/23 RVP: Influenza A 1/23 Blood cx: ngtd 1/25 mrsa pcr: neg 1/26 resp cx: pending   Brandell Maready, Rande Lawman 08/21/2017 11:22 AM

## 2017-08-22 ENCOUNTER — Inpatient Hospital Stay (HOSPITAL_COMMUNITY): Payer: Self-pay

## 2017-08-22 LAB — POCT I-STAT 3, ART BLOOD GAS (G3+)
ACID-BASE EXCESS: 3 mmol/L — AB (ref 0.0–2.0)
BICARBONATE: 28.2 mmol/L — AB (ref 20.0–28.0)
O2 Saturation: 95 %
PH ART: 7.407 (ref 7.350–7.450)
PO2 ART: 73 mmHg — AB (ref 83.0–108.0)
TCO2: 30 mmol/L (ref 22–32)
pCO2 arterial: 44.7 mmHg (ref 32.0–48.0)

## 2017-08-22 LAB — CBC WITH DIFFERENTIAL/PLATELET
Basophils Absolute: 0 K/uL (ref 0.0–0.1)
Basophils Absolute: 0 10*3/uL (ref 0.0–0.1)
Basophils Relative: 0 %
Basophils Relative: 0 %
EOS PCT: 1 %
Eosinophils Absolute: 0 K/uL (ref 0.0–0.7)
Eosinophils Absolute: 0.1 10*3/uL (ref 0.0–0.7)
Eosinophils Relative: 0 %
HCT: 26.7 % — ABNORMAL LOW (ref 36.0–46.0)
HEMATOCRIT: 26.3 % — AB (ref 36.0–46.0)
HEMOGLOBIN: 8.2 g/dL — AB (ref 12.0–15.0)
Hemoglobin: 8.4 g/dL — ABNORMAL LOW (ref 12.0–15.0)
LYMPHS PCT: 15 %
Lymphocytes Relative: 13 %
Lymphs Abs: 1.2 K/uL (ref 0.7–4.0)
Lymphs Abs: 1.5 10*3/uL (ref 0.7–4.0)
MCH: 23.3 pg — ABNORMAL LOW (ref 26.0–34.0)
MCH: 23 pg — AB (ref 26.0–34.0)
MCHC: 31.5 g/dL (ref 30.0–36.0)
MCHC: 31.2 g/dL (ref 30.0–36.0)
MCV: 74.2 fL — ABNORMAL LOW (ref 78.0–100.0)
MCV: 73.7 fL — ABNORMAL LOW (ref 78.0–100.0)
Monocytes Absolute: 0.9 K/uL (ref 0.1–1.0)
Monocytes Absolute: 0.9 10*3/uL (ref 0.1–1.0)
Monocytes Relative: 9 %
Monocytes Relative: 9 %
NEUTROS ABS: 7.2 10*3/uL (ref 1.7–7.7)
NEUTROS PCT: 75 %
Neutro Abs: 7.5 K/uL (ref 1.7–7.7)
Neutrophils Relative %: 78 %
Platelets: 241 K/uL (ref 150–400)
Platelets: 246 10*3/uL (ref 150–400)
RBC: 3.6 MIL/uL — ABNORMAL LOW (ref 3.87–5.11)
RBC: 3.57 MIL/uL — ABNORMAL LOW (ref 3.87–5.11)
RDW: 18.3 % — ABNORMAL HIGH (ref 11.5–15.5)
RDW: 18.3 % — ABNORMAL HIGH (ref 11.5–15.5)
WBC: 9.6 K/uL (ref 4.0–10.5)
WBC: 9.7 10*3/uL (ref 4.0–10.5)

## 2017-08-22 LAB — APTT: APTT: 31 s (ref 24–36)

## 2017-08-22 LAB — BASIC METABOLIC PANEL WITH GFR
Anion gap: 10 (ref 5–15)
BUN: 28 mg/dL — ABNORMAL HIGH (ref 6–20)
CO2: 23 mmol/L (ref 22–32)
Calcium: 8.5 mg/dL — ABNORMAL LOW (ref 8.9–10.3)
Chloride: 102 mmol/L (ref 101–111)
Creatinine, Ser: 1.26 mg/dL — ABNORMAL HIGH (ref 0.44–1.00)
GFR calc Af Amer: 56 mL/min — ABNORMAL LOW
GFR calc non Af Amer: 48 mL/min — ABNORMAL LOW
Glucose, Bld: 159 mg/dL — ABNORMAL HIGH (ref 65–99)
Potassium: 4.3 mmol/L (ref 3.5–5.1)
Sodium: 135 mmol/L (ref 135–145)

## 2017-08-22 LAB — GLUCOSE, CAPILLARY
GLUCOSE-CAPILLARY: 127 mg/dL — AB (ref 65–99)
GLUCOSE-CAPILLARY: 124 mg/dL — AB (ref 65–99)
GLUCOSE-CAPILLARY: 130 mg/dL — AB (ref 65–99)
Glucose-Capillary: 122 mg/dL — ABNORMAL HIGH (ref 65–99)
Glucose-Capillary: 132 mg/dL — ABNORMAL HIGH (ref 65–99)
Glucose-Capillary: 149 mg/dL — ABNORMAL HIGH (ref 65–99)

## 2017-08-22 LAB — COMPREHENSIVE METABOLIC PANEL
ALK PHOS: 59 U/L (ref 38–126)
ALT: 21 U/L (ref 14–54)
ANION GAP: 8 (ref 5–15)
AST: 38 U/L (ref 15–41)
Albumin: 1.8 g/dL — ABNORMAL LOW (ref 3.5–5.0)
BILIRUBIN TOTAL: 0.5 mg/dL (ref 0.3–1.2)
BUN: 31 mg/dL — ABNORMAL HIGH (ref 6–20)
CALCIUM: 8.7 mg/dL — AB (ref 8.9–10.3)
CO2: 24 mmol/L (ref 22–32)
CREATININE: 1.24 mg/dL — AB (ref 0.44–1.00)
Chloride: 105 mmol/L (ref 101–111)
GFR calc non Af Amer: 49 mL/min — ABNORMAL LOW (ref >60)
GFR, EST AFRICAN AMERICAN: 57 mL/min — AB (ref >60)
Glucose, Bld: 130 mg/dL — ABNORMAL HIGH (ref 65–99)
Potassium: 4.2 mmol/L (ref 3.5–5.1)
Sodium: 137 mmol/L (ref 135–145)
TOTAL PROTEIN: 5.6 g/dL — AB (ref 6.5–8.1)

## 2017-08-22 LAB — BLOOD GAS, ARTERIAL
Acid-Base Excess: 1.2 mmol/L (ref 0.0–2.0)
Bicarbonate: 25.8 mmol/L (ref 20.0–28.0)
Drawn by: 252031
FIO2: 50
MECHVT: 390 mL
O2 Saturation: 96.3 %
PEEP: 14 cmH2O
Patient temperature: 98.9
RATE: 35 {breaths}/min
pCO2 arterial: 45.3 mmHg (ref 32.0–48.0)
pH, Arterial: 7.375 (ref 7.350–7.450)
pO2, Arterial: 89.7 mmHg (ref 83.0–108.0)

## 2017-08-22 LAB — CULTURE, RESPIRATORY W GRAM STAIN

## 2017-08-22 LAB — CULTURE, RESPIRATORY

## 2017-08-22 LAB — CULTURE, BLOOD (ROUTINE X 2)
Culture: NO GROWTH
Special Requests: ADEQUATE

## 2017-08-22 LAB — PROTIME-INR
INR: 1.11
Prothrombin Time: 14.2 seconds (ref 11.4–15.2)

## 2017-08-22 MED ORDER — MAGNESIUM HYDROXIDE 400 MG/5ML PO SUSP
15.0000 mL | Freq: Every day | ORAL | Status: DC
  Administered 2017-08-22 – 2017-08-24 (×3): 15 mL
  Filled 2017-08-22 (×4): qty 30

## 2017-08-22 MED ORDER — LACTULOSE 10 GM/15ML PO SOLN
30.0000 g | Freq: Once | ORAL | Status: AC
  Administered 2017-08-22: 30 g
  Filled 2017-08-22: qty 45

## 2017-08-22 MED ORDER — BISACODYL 10 MG RE SUPP: 10.0000 mg | Freq: Every day | RECTAL | Status: DC

## 2017-08-22 MED ORDER — CHLORHEXIDINE GLUCONATE CLOTH 2 % EX PADS
6.0000 | MEDICATED_PAD | Freq: Every day | CUTANEOUS | Status: DC
  Administered 2017-08-22 – 2017-08-30 (×9): 6 via TOPICAL

## 2017-08-22 MED ORDER — SODIUM CHLORIDE 0.9% FLUSH
10.0000 mL | Freq: Two times a day (BID) | INTRAVENOUS | Status: DC
  Administered 2017-08-22 – 2017-08-25 (×8): 10 mL
  Administered 2017-08-26: 20 mL
  Administered 2017-08-27 – 2017-08-29 (×2): 10 mL

## 2017-08-22 MED ORDER — DOCUSATE SODIUM 50 MG/5ML PO LIQD
100.0000 mg | Freq: Two times a day (BID) | ORAL | Status: DC
  Administered 2017-08-22 – 2017-09-09 (×29): 100 mg
  Filled 2017-08-22 (×37): qty 10

## 2017-08-22 MED ORDER — METOLAZONE 10 MG PO TABS
10.0000 mg | ORAL_TABLET | Freq: Once | ORAL | Status: AC
  Administered 2017-08-22: 10 mg via ORAL
  Filled 2017-08-22: qty 1

## 2017-08-22 MED ORDER — SODIUM CHLORIDE 0.9% FLUSH
10.0000 mL | INTRAVENOUS | Status: DC | PRN
  Administered 2017-08-22: 10 mL
  Filled 2017-08-22: qty 40

## 2017-08-22 MED ORDER — FUROSEMIDE 10 MG/ML IJ SOLN
5.0000 mg/h | INTRAVENOUS | Status: DC
  Administered 2017-08-22: 5 mg/h via INTRAVENOUS
  Filled 2017-08-22: qty 5

## 2017-08-22 MED ORDER — PANTOPRAZOLE SODIUM 40 MG PO PACK
40.0000 mg | PACK | ORAL | Status: DC
  Administered 2017-08-22 – 2017-08-31 (×10): 40 mg
  Filled 2017-08-22 (×10): qty 20

## 2017-08-22 NOTE — Progress Notes (Signed)
PULMONARY / CRITICAL CARE MEDICINE   Name: Cassie [name] MRN: [id number] DOB: Jun 17, 1966    ADMISSION DATE:  08/17/2017 CONSULTATION DATE:  08/19/2017  REFERRING MD:  Carolin Sicks  CHIEF COMPLAINT:  Dyspnea  HISTORY OF PRESENT ILLNESS:   52 y/o female with minimal past medical history admitted with ARDS from Influenza A. Received Tamiflu, currently enrolled in a phase 2 clinical trial for an influenza investigational agent. She was intubated for airway support and respiratory distress (ARDS) on 1/25.   SUBJECTIVE:  Patient had several episodes of oxygen desaturation requiring increased FiO2 and PEEP to maintain sats greater than 90%. Weaning was delayed until today. Urine output improved over last 24 hours  VITAL SIGNS: BP 122/62 (BP Location: Left Arm)   Pulse 75   Temp 98.9 F (37.2 C) (Oral)   Resp (!) 35   Ht 5\' 4"  (1.626 m)   Wt 254 lb 13.6 oz (115.6 kg)   LMP 08/17/2017 (Exact Date)   SpO2 95%   BMI 43.75 kg/m   HEMODYNAMICS: CVP:  [14 mmHg-15 mmHg] 15 mmHg  VENTILATOR SETTINGS: Vent Mode: PRVC FiO2 (%):  [50 %-80 %] 50 % Set Rate:  [35 bmp] 35 bmp Vt Set:  [390 mL] 390 mL PEEP:  [10 cmH20-14 cmH20] 14 cmH20 Plateau Pressure:  [33 cmH20-38 cmH20] 35 cmH20  INTAKE / OUTPUT: I/O last 3 completed shifts: In: 5019.2 [I.V.:2410.5; NG/GT:1808.7; IV Piggyback:800] Out: 1642 [Urine:1642]  PHYSICAL EXAMINATION: General: Intubated, sedated and paralized Neurological: unresponsive to all commands HEENT: PER and unreactive to light, MM moist Cardiovascular: S1-2 difficult to auscultate, RRR, no murmur auscultated  Pulmonary: Mild inspiratory and expiratory wheezing bilaterally, no crackles  GI: Bowel sound present but hypoactive, mildly distended  Musculoskeletal: +1 pitting edema bilateral distal extremities remains Skin: no rash or ecchymosis observed  LABS: BMET Recent Labs  Lab 08/20/17 1810 08/21/17 0351 08/22/17 0420  NA 132* 134* 135  K 3.6 4.1 4.3   CL 101 104 102  CO2 23 23 23   BUN 18 23* 28*  CREATININE 1.45* 1.25* 1.26*  GLUCOSE 114* 129* 159*   Electrolytes Recent Labs  Lab 08/20/17 1025  08/20/17 1649 08/20/17 1810 08/21/17 0351 08/21/17 1615 08/22/17 0420  CALCIUM  --    < >  --  7.7* 8.4*  --  8.5*  MG 2.0  --  2.0  --   --  2.1  --   PHOS 2.3*  --  2.2*  --   --  3.3  --    < > = values in this interval not displayed.   CBC Recent Labs  Lab 08/20/17 1142 08/21/17 0351 08/22/17 0420  WBC 10.6* 9.4 9.6  HGB 8.8* 8.4* 8.4*  HCT 28.9* 27.2* 26.7*  PLT 129* 153 241   Coag's Recent Labs  Lab 08/18/17 1613 08/20/17 1142  APTT 37* 31  INR 1.10 1.26   Sepsis Markers Recent Labs  Lab 08/17/17 1855 08/17/17 2049 08/18/17 0756 08/20/17 0317 08/20/17 0325  LATICACIDVEN 1.99*  --   --  1.1  --   PROCALCITON  --  0.34 0.33  --  1.45   ABG Recent Labs  Lab 08/20/17 1935 08/21/17 0357 08/22/17 0423  PHART 7.423 7.375 7.375  PCO2ART 38.5 43.3 45.3  PO2ART 75.8* 81.4* 89.7   Liver Enzymes Recent Labs  Lab 08/19/17 0414 08/19/17 2056 08/20/17 1142  AST 82* 69* 50*  ALT 33 27 24  ALKPHOS 81 69 63  BILITOT 0.4 0.5 0.8  ALBUMIN 2.9*  2.2* 2.0*   Cardiac Enzymes No results for input(s): TROPONINI, PROBNP in the last 168 hours.  Glucose Recent Labs  Lab 08/21/17 0749 08/21/17 1212 08/21/17 1546 08/21/17 1942 08/21/17 2356 08/22/17 0320  GLUCAP 109* 112* 119* 140* 134* 127*   Imaging Dg Chest Port 1 View  Result Date: 08/21/2017 CLINICAL DATA:  Intubation EXAM: PORTABLE CHEST 1 VIEW COMPARISON:  Portable exam 1047 hours compared to 0531 hours FINDINGS: Tip of endotracheal tube projects 2.9 cm above carina. Nasogastric tube coiled in stomach. RIGHT jugular central venous catheter tip projects over proximal SVC. Enlargement of cardiac silhouette. Severe diffuse BILATERAL airspace infiltrates unchanged. No pneumothorax. IMPRESSION: Tip of endotracheal tube projects 2.9 cm above carina.  Enlargement of cardiac silhouette with persistent severe diffuse BILATERAL airspace infiltrates. Electronically Signed   By: Lavonia Dana M.D.   On: 08/21/2017 11:16   STUDIES:  1/25 Echo: LVEF 55-60%, Grade 2 DD, mild left atrial dilation  CULTURES: 1/26 resp > Influenza A positive 1/23 blood >  1/23 RSVP > positive for FLU A H1 2009  ANTIBIOTICS: 1/25 vanc >>> 1/27 1/23 azithro >>> 1/27 1/23 ceftriaxone >>> 1/25 1/25 meropenem >>>  1/23 tamiflu >>>  SIGNIFICANT EVENTS: 1/23 admission 1/25 intubation  LINES/TUBES: 1/25 ETT >>>  1/25 R IJ CVL>>>  1/25 L radial arterial line >>> 1/25  DISCUSSION: 52 y/o female with ARDS from severe community acquired pneumonia Influenza A.  Possible bacterial superinfection based on multi-lobar infiltrates.  Now with worsening multi-organ failure.  Oxygenation somewhat improved 1/26.  ASSESSMENT / PLAN:  PULMONARY A: ARDS > improved oxygenation following intubation-but continues to require high PEEP with intermittent oxygen desaturation  ABG 7.375/45/88/26 Lasix 60mg  IV with mild symptomatic improvement  CXR indicated probably mild improvement P:   Full mechanical vent support > ARDS protocol VAP prevention Daily WUA/SBT Consider discontinuation of Nimbex this am Monitor ABG closely  CARDIOVASCULAR A:  Septic shock She may be intravascularly dry but has responded to lasix. Difficult to assess volume status with CXR due to ARDS P:  Continue levophed for MAP > 65 Monitor CVP Tele Monitor hemodynamics  RENAL A:   AKI> improving, urine output improved U/A Mucus, Hyaline-granular casts, Amorphus crystals present, 6-30 RBC's P:   Monitor BMP and UOP daily Replace electrolytes as needed  GASTROINTESTINAL A:   No acute issues P:   Continue tube feeding Pantoprazole for stress ulcer prophylaxis  HEMATOLOGIC A: Anemia without acute hemorrhage, Hgb stable P: Monitor for bleeding Transfuse for Hgb < 7gm/dL  INFECTIOUS A:    Severe CAP from Influenza A Bacterial superinfection?  Leukocytosis resolved  P:   Follow up trach aspirate cultures as above Narrowed antibiotics to meropenum  Continue oseltamivir   ENDOCRINE A:   Mild hyperglycemia   P:   SSI  NEUROLOGIC A:   Sedation/paralytic need for vent synchrony P:   RASS goal: -5 Continue PAD protocol, NIMBEX protocol for 1/28, likely stop tomorrow  FAMILY  - Updates: called her boyfriend Josh Pardue 08/22/2017@6 :33 AM > no answer  - Inter-disciplinary family meet or Palliative Care meeting due by:  day 7  Kathi Ludwig, MD Resident PCCM Physician Pager #: [phone]  Attending Note:  52 year old female with ARDS and PNA who was intubated and paralyzed on 1/25.  On exam, coarse BS diffusely and diffuse edema.  I reviewed CXR myself, pulmonary edema and infiltrate noted.  Discussed with resident.  Will begin aggressive diureses today.  Replace electrolytes.  Continue TF.  Start laxative  today.  Continue full vent support.  If responds to diureses will consider d/c of paralytics in AM.  The patient is critically ill with multiple organ systems failure and requires high complexity decision making for assessment and support, frequent evaluation and titration of therapies, application of advanced monitoring technologies and extensive interpretation of multiple databases.   Critical Care Time devoted to patient care services described in this note is  35  Minutes. This time reflects time of care of this signee Dr [name]. This critical care time does not reflect procedure time, or teaching time or supervisory time of PA/NP/Med student/Med Resident etc but could involve care discussion time.  Rush Farmer, M.D. [facility] Pulmonary/Critical Care Medicine. Pager: [phone]. After hours pager: [phone].

## 2017-08-22 NOTE — Research (Addendum)
Title: A Randomized, Double-Blind, Placebo-Controlled Dose Ranging Study Evaluating the Safety Pharmacokinetics and Clinical Benefit of FLU-IGIV in Hospitalized Patients with Serious Influenza A infection. IA-001 (ClinicalTrials.gov Identifier: [id number], Protocol No: IA-001, [facility] Protocol #[id number])  RESEARCH SUBJECT. This research study is sponsored by [employer]   Protocol: amendment 4 -> 16 Dec 2016   Clinical Research Coordinator / Research RN note : This visit for Subject Cassie [name] with DOB: 06/13/1966 on 08/22/2017 for the above protocol is Visit/Encounter # Day 4 and is for purpose of Day 4 assessments. The consent for this encounter is under Protocol Version 4 and currently IRB approved.   Patient is located in intensive care unit [facility] and is currently on the ventilator. Per ICU nurse the subject is in medically induced paralysis due to the intubation so unable to assess flu symptoms at this time.  Local labs drawn at 10:53. New score assessed at 11:01 with total score of 10. Rest of the visit conducted as per the protocol. Refer to the subject's paper source binder for further documentation of assessments and procedures.   Attempted to contact LAR, Torrie Mayers, at approx 16:34. Spoke to him and he agreed to meet up with research team to sign off the LAR informed consent form.   Zenon Mayo, MS, [place], [place], [place] Coordinator II [phone] Agustina Caroli [place], [place] 3:24 [place] 08/22/2017

## 2017-08-22 NOTE — Progress Notes (Signed)
CRITICAL VALUE ALERT  Critical Value:  ABG O2 73%  Date & Time Notied: 08/22/17  Provider Notified: Yes  Orders Received/Actions taken: No, paged again

## 2017-08-23 ENCOUNTER — Inpatient Hospital Stay (HOSPITAL_COMMUNITY): Payer: Self-pay

## 2017-08-23 LAB — POCT I-STAT 3, ART BLOOD GAS (G3+)
Acid-Base Excess: 7 mmol/L — ABNORMAL HIGH (ref 0.0–2.0)
Acid-Base Excess: 11 mmol/L — ABNORMAL HIGH (ref 0.0–2.0)
Acid-Base Excess: 9 mmol/L — ABNORMAL HIGH (ref 0.0–2.0)
Bicarbonate: 33 mmol/L — ABNORMAL HIGH (ref 20.0–28.0)
Bicarbonate: 32.3 mmol/L — ABNORMAL HIGH (ref 20.0–28.0)
Bicarbonate: 36.3 mmol/L — ABNORMAL HIGH (ref 20.0–28.0)
O2 SAT: 94 %
O2 SAT: 96 %
O2 Saturation: 99 %
PCO2 ART: 54.5 mmHg — AB (ref 32.0–48.0)
PH ART: 7.424 (ref 7.350–7.450)
PH ART: 7.505 — AB (ref 7.350–7.450)
PO2 ART: 74 mmHg — AB (ref 83.0–108.0)
Patient temperature: 99
TCO2: 35 mmol/L — ABNORMAL HIGH (ref 22–32)
TCO2: 34 mmol/L — AB (ref 22–32)
TCO2: 38 mmol/L — AB (ref 22–32)
pCO2 arterial: 41.1 mmHg (ref 32.0–48.0)
pCO2 arterial: 55.4 mmHg — ABNORMAL HIGH (ref 32.0–48.0)
pH, Arterial: 7.391 (ref 7.350–7.450)
pO2, Arterial: 145 mmHg — ABNORMAL HIGH (ref 83.0–108.0)
pO2, Arterial: 80 mmHg — ABNORMAL LOW (ref 83.0–108.0)

## 2017-08-23 LAB — GLUCOSE, CAPILLARY
Glucose-Capillary: 152 mg/dL — ABNORMAL HIGH (ref 65–99)
Glucose-Capillary: 136 mg/dL — ABNORMAL HIGH (ref 65–99)
Glucose-Capillary: 120 mg/dL — ABNORMAL HIGH (ref 65–99)
Glucose-Capillary: 140 mg/dL — ABNORMAL HIGH (ref 65–99)
Glucose-Capillary: 139 mg/dL — ABNORMAL HIGH (ref 65–99)
Glucose-Capillary: 152 mg/dL — ABNORMAL HIGH (ref 65–99)

## 2017-08-23 LAB — CBC WITH DIFFERENTIAL/PLATELET
Band Neutrophils: 7 %
Basophils Absolute: 0 10*3/uL (ref 0.0–0.1)
Basophils Relative: 0 %
Blasts: 0 %
Eosinophils Absolute: 0.1 10*3/uL (ref 0.0–0.7)
Eosinophils Relative: 1 %
HCT: 27.1 % — ABNORMAL LOW (ref 36.0–46.0)
Hemoglobin: 8.5 g/dL — ABNORMAL LOW (ref 12.0–15.0)
Lymphocytes Relative: 9 %
Lymphs Abs: 1.1 10*3/uL (ref 0.7–4.0)
MCH: 23.2 pg — ABNORMAL LOW (ref 26.0–34.0)
MCHC: 31.4 g/dL (ref 30.0–36.0)
MCV: 74 fL — ABNORMAL LOW (ref 78.0–100.0)
Metamyelocytes Relative: 7 %
Monocytes Absolute: 1 10*3/uL (ref 0.1–1.0)
Monocytes Relative: 8 %
Myelocytes: 1 %
Neutro Abs: 9.7 10*3/uL — ABNORMAL HIGH (ref 1.7–7.7)
Neutrophils Relative %: 67 %
Other: 0 %
Platelets: 335 10*3/uL (ref 150–400)
Promyelocytes Absolute: 0 %
RBC: 3.66 MIL/uL — ABNORMAL LOW (ref 3.87–5.11)
RDW: 18.2 % — ABNORMAL HIGH (ref 11.5–15.5)
WBC: 11.9 10*3/uL — ABNORMAL HIGH (ref 4.0–10.5)
nRBC: 0 /100 WBC

## 2017-08-23 LAB — BASIC METABOLIC PANEL
Anion gap: 12 (ref 5–15)
BUN: 34 mg/dL — ABNORMAL HIGH (ref 6–20)
CO2: 28 mmol/L (ref 22–32)
Calcium: 8.9 mg/dL (ref 8.9–10.3)
Chloride: 99 mmol/L — ABNORMAL LOW (ref 101–111)
Creatinine, Ser: 1.25 mg/dL — ABNORMAL HIGH (ref 0.44–1.00)
GFR calc Af Amer: 56 mL/min — ABNORMAL LOW (ref >60)
GFR calc non Af Amer: 49 mL/min — ABNORMAL LOW (ref >60)
Glucose, Bld: 161 mg/dL — ABNORMAL HIGH (ref 65–99)
Potassium: 4.1 mmol/L (ref 3.5–5.1)
Sodium: 139 mmol/L (ref 135–145)

## 2017-08-23 LAB — CULTURE, BLOOD (ROUTINE X 2)
Culture: NO GROWTH
SPECIAL REQUESTS: ADEQUATE

## 2017-08-23 LAB — LEGIONELLA PNEUMOPHILA SEROGP 1 UR AG: L. pneumophila Serogp 1 Ur Ag: NEGATIVE

## 2017-08-23 LAB — PHOSPHORUS: Phosphorus: 4.2 mg/dL (ref 2.5–4.6)

## 2017-08-23 LAB — PROCALCITONIN: PROCALCITONIN: 0.87 ng/mL

## 2017-08-23 LAB — MAGNESIUM: Magnesium: 2.1 mg/dL (ref 1.7–2.4)

## 2017-08-23 MED ORDER — METOLAZONE 10 MG PO TABS
10.0000 mg | ORAL_TABLET | Freq: Once | ORAL | Status: AC
  Administered 2017-08-23: 10 mg via ORAL
  Filled 2017-08-23: qty 1

## 2017-08-23 MED ORDER — BISACODYL 10 MG RE SUPP
10.0000 mg | Freq: Every day | RECTAL | Status: DC
  Administered 2017-08-23 – 2017-09-03 (×8): 10 mg via RECTAL
  Filled 2017-08-23 (×10): qty 1

## 2017-08-23 MED ORDER — LINEZOLID 600 MG/300ML IV SOLN
600.0000 mg | Freq: Two times a day (BID) | INTRAVENOUS | Status: AC
  Administered 2017-08-23 – 2017-08-27 (×10): 600 mg via INTRAVENOUS
  Filled 2017-08-23 (×10): qty 300

## 2017-08-23 MED ORDER — POTASSIUM CHLORIDE 20 MEQ/15ML (10%) PO SOLN
40.0000 meq | Freq: Once | ORAL | Status: AC
  Administered 2017-08-23: 40 meq
  Filled 2017-08-23: qty 30

## 2017-08-23 MED ORDER — FUROSEMIDE 10 MG/ML IJ SOLN
5.0000 mg/h | INTRAVENOUS | Status: DC
  Administered 2017-08-23: 10 mg/h via INTRAVENOUS
  Filled 2017-08-23: qty 25

## 2017-08-23 MED ORDER — FENTANYL CITRATE (PF) 2500 MCG/50ML IJ SOLN
0.0000 ug/h | Status: DC
  Administered 2017-08-23 – 2017-08-24 (×2): 250 ug/h via INTRAVENOUS
  Administered 2017-08-25 (×2): 275 ug/h via INTRAVENOUS
  Administered 2017-08-26: 250 ug/h via INTRAVENOUS
  Administered 2017-08-27 – 2017-08-28 (×2): 275 ug/h via INTRAVENOUS
  Administered 2017-08-29 – 2017-08-31 (×2): 300 ug/h via INTRAVENOUS
  Filled 2017-08-23 (×12): qty 100

## 2017-08-23 NOTE — Progress Notes (Signed)
PULMONARY / CRITICAL CARE MEDICINE   Name: Cassie [name] MRN: [id number] DOB: [birth year]    ADMISSION DATE:  08/17/2017 CONSULTATION DATE:  08/19/2017  REFERRING MD:  Carolin Sicks  CHIEF COMPLAINT:  Dyspnea  HISTORY OF PRESENT ILLNESS:   52 y/o female with minimal past medical history admitted with ARDS from Influenza A. Received Tamiflu, currently enrolled in a phase 2 clinical trial for an influenza investigational agent. She was intubated for airway support and respiratory distress (ARDS) on 1/25.   SUBJECTIVE:  Patient had several episodes of oxygen desaturation requiring increased FiO2 and PEEP to maintain sats greater than 90%. Weaning was delayed until today. Urine output improved over last 24 hours with IV lasix  VITAL SIGNS: BP 115/60   Pulse (!) 101   Temp 99 F (37.2 C) (Oral)   Resp (!) 35   Ht 5\' 4"  (1.626 m)   Wt 257 lb 0.9 oz (116.6 kg)   LMP 08/17/2017 (Exact Date)   SpO2 96%   BMI 44.12 kg/m   HEMODYNAMICS:    VENTILATOR SETTINGS: Vent Mode: PRVC FiO2 (%):  [40 %-100 %] 80 % Set Rate:  [14 bmp-35 bmp] 35 bmp Vt Set:  [390 mL] 390 mL PEEP:  [14 cmH20] 14 cmH20 Plateau Pressure:  [30 cmH20-36 cmH20] 36 cmH20  INTAKE / OUTPUT: I/O last 3 completed shifts: In: 4989.6 [I.V.:1951.6; Other:220; GB/TD:1761; IV [id number]:694] Out: 3072 [Urine:3072]  PHYSICAL EXAMINATION: General: Intubated, sedated and paralized Neurological: unresponsive to all commands HEENT: PER and unreactive to light, MM moist Cardiovascular: S1-2 difficult to auscultate, RRR, no murmur auscultated  Pulmonary: Mild inspiratory and expiratory wheezing bilaterally, crackles noted on exam GI: Bowel sound present but hypoactive, mildly distended  Musculoskeletal: +1 pitting edema bilateral distal extremities remains Skin: no rash or ecchymosis observed  LABS: BMET Recent Labs  Lab 08/22/17 0420 08/22/17 1100 08/23/17 0421  NA 135 137 139  K 4.3 4.2 4.1  CL 102 105 99*  CO2 23  24 28   BUN 28* 31* 34*  CREATININE 1.26* 1.24* 1.25*  GLUCOSE 159* 130* 161*   Electrolytes Recent Labs  Lab 08/20/17 1649  08/21/17 1615 08/22/17 0420 08/22/17 1100 08/23/17 0421  CALCIUM  --    < >  --  8.5* 8.7* 8.9  MG 2.0  --  2.1  --   --  2.1  PHOS 2.2*  --  3.3  --   --  4.2   < > = values in this interval not displayed.   CBC Recent Labs  Lab 08/22/17 0420 08/22/17 1100 08/23/17 0421  WBC 9.6 9.7 11.9*  HGB 8.4* 8.2* 8.5*  HCT 26.7* 26.3* 27.1*  PLT 241 246 335   Coag's Recent Labs  Lab 08/18/17 1613 08/20/17 1142 08/22/17 1100  APTT 37* 31 31  INR 1.10 1.26 1.11   Sepsis Markers Recent Labs  Lab 08/17/17 1855 08/17/17 2049 08/18/17 0756 08/20/17 0317 08/20/17 0325  LATICACIDVEN 1.99*  --   --  1.1  --   PROCALCITON  --  0.34 0.33  --  1.45   ABG Recent Labs  Lab 08/22/17 0423 08/22/17 1609 08/23/17 0419  PHART 7.375 7.407 7.391  PCO2ART 45.3 44.7 54.5*  PO2ART 89.7 73.0* 74.0*   Liver Enzymes Recent Labs  Lab 08/19/17 2056 08/20/17 1142 08/22/17 1100  AST 69* 50* 38  ALT 27 24 21   ALKPHOS 69 63 59  BILITOT 0.5 0.8 0.5  ALBUMIN 2.2* 2.0* 1.8*   Cardiac Enzymes No results for input(s):  TROPONINI, PROBNP in the last 168 hours.  Glucose Recent Labs  Lab 08/22/17 0720 08/22/17 1104 08/22/17 1523 08/22/17 1955 08/22/17 2333 08/23/17 0408  GLUCAP 122* 132* 124* 130* 149* 152*   Imaging No results found. STUDIES:  1/25 Echo: LVEF 55-60%, Grade 2 DD, mild left atrial dilation  CULTURES: 1/26 resp > Influenza A positive 1/23 blood >  1/23 RSVP > positive for FLU A H1 2009  ANTIBIOTICS: 1/26 vanc >>> 1/27 1/23 azithro >>> 1/27 1/23 ceftriaxone >>> 1/25 1/26 meropenem >>>  1/23 tamiflu >>>  SIGNIFICANT EVENTS: 1/23 admission 1/25 intubation  LINES/TUBES: 1/25 ETT >>>  1/25 R IJ CVL>>>  1/25 L radial arterial line >>> 1/25  DISCUSSION: 52 y/o female with ARDS from severe community acquired pneumonia Influenza  A.  Possible bacterial superinfection based on multi-lobar infiltrates.  Now with worsening multi-organ failure.  Oxygenation somewhat improved 1/26.  ASSESSMENT / PLAN:  PULMONARY A: ARDS > improved oxygenation following intubation-but continues to require high PEEP with intermittent oxygen desaturation  ABG 7.391/55/74/33 Lasix 60mg  IV with mild symptomatic improvement  CXR indicated mild improvement 1/29 P:   Full mechanical vent support > ARDS protocol VAP prevention Daily WUA/SBT Consider discontinuation of Nimbex again this am Monitor ABG closely  CARDIOVASCULAR A:  Septic shock She may be intravascularly dry but has responded to lasix. Difficult to assess volume status with CXR due to ARDS P:  Continue levophed for MAP > 65 Monitor CVP during diuresis  Tele Monitor hemodynamics  RENAL A:    AKI> improving, urine output improved, Cr stable at 1.25 U/A Mucus, Hyaline-granular casts, Amorphus crystals present, 6-30 RBC's P:    Monitor BMP and UOP daily Replace electrolytes as needed Continue diuresis  Furosemide gtt 5mg /hr  GASTROINTESTINAL A:   Constipation  P:   Continue tube feeding Pantoprazole for stress ulcer prophylaxis Lactulose X1 dose Docusate per tube Dulcolax suppository    HEMATOLOGIC A: Anemia without acute hemorrhage, Hgb stable P: Monitor for bleeding Transfuse for Hgb < 7gm/dL  INFECTIOUS A:   Severe CAP from Influenza A Bacterial superinfection?  Leukocytosis- mild 11.9 P:   Narrowed antibiotics to meropenum  Continue oseltamivir   ENDOCRINE A:   Mild hyperglycemia   P:   SSI  NEUROLOGIC A:   Sedation/paralytic need for vent synchrony P:   RASS goal: -5 Continue PAD protocol Nimbex continued   FAMILY  - Updates: called her boyfriend Josh Pardue 08/23/2017@6 :73 AM > no answer - Inter-disciplinary family meet or Palliative Care meeting due by:  day 7  Kathi Ludwig, MD Resident PCCM Physician Pager #:  [phone]  Attending Note:  52 year old female with flu A, PNA, ARDS and respiratory failure.  On exam, coarse BS diffusely with edema noted.  I reviewed CXR myself, ETT is in good position.  Will stop nimbex today.  Add linezolid.  Vent adjusted for ABG.  Repeat ABG in 30 minutes.  Hope is to stay around 60% and PEEP of 10 with nimbex off.  Increase lasix to 10 mg/hr, add zaroxolyn, replace electrolytes.  Hope is negative 2L by AM.  The patient is critically ill with multiple organ systems failure and requires high complexity decision making for assessment and support, frequent evaluation and titration of therapies, application of advanced monitoring technologies and extensive interpretation of multiple databases.   Critical Care Time devoted to patient care services described in this note is  35  Minutes. This time reflects time of care of this signee Dr Michaelene Song  Nelda [place]. This critical care time does not reflect procedure time, or teaching time or supervisory time of PA/NP/Med student/Med Resident etc but could involve care discussion time.  Rush Farmer, M.D. [facility] Pulmonary/Critical Care Medicine. Pager: [phone]. After hours pager: [phone].

## 2017-08-23 NOTE — Procedures (Signed)
Arterial Catheter Insertion Procedure Note Cassie [name] [id number] May 12, 1966  Procedure: Insertion of Arterial Catheter  Indications: Blood pressure monitoring and Frequent blood sampling  Procedure Details Consent: Unable to obtain consent because of altered level of consciousness. Time Out: Verified patient identification, verified procedure, site/side was marked, verified correct patient position, special equipment/implants available, medications/allergies/relevent history reviewed, required imaging and test results available.  Performed  Maximum sterile technique was used including antiseptics, cap, gloves, gown, hand hygiene, mask and sheet. Skin prep: Chlorhexidine; local anesthetic administered 20 gauge catheter was inserted into left radial artery using the Seldinger technique.  Evaluation Blood flow good; BP tracing good. Complications: No apparent complications.   Cassie [name] 08/23/2017

## 2017-08-23 NOTE — Research (Signed)
Title: A Randomized, Double-Blind, Placebo-Controlled Dose Ranging Study Evaluating the Safety Pharmacokinetics and Clinical Benefit of FLU-IGIV in Hospitalized Patients with Serious Influenza A infection. IA-001 (ClinicalTrials.gov Identifier: [id number], Protocol No: IA-001, [facility] Protocol #[id number])  RESEARCH SUBJECT. This research study is sponsored by [employer]    ..................................................................................................Marland Kitchen   LAR Mr. Torrie Mayers was schedule to meet [place] today (23 Aug 2017) for signing off LAR part of informed consent form.  Met with Mr. Starr Sinclair today (23 Aug 2017) and got his signature as LAR on informed consent form at 09:52. Copy of this informed consent form is given to him and a copy is saved in patient binder.  [place], [place]

## 2017-08-24 ENCOUNTER — Inpatient Hospital Stay (HOSPITAL_COMMUNITY): Payer: Self-pay

## 2017-08-24 LAB — GLUCOSE, CAPILLARY
GLUCOSE-CAPILLARY: 170 mg/dL — AB (ref 65–99)
GLUCOSE-CAPILLARY: 148 mg/dL — AB (ref 65–99)
Glucose-Capillary: 122 mg/dL — ABNORMAL HIGH (ref 65–99)
Glucose-Capillary: 131 mg/dL — ABNORMAL HIGH (ref 65–99)
Glucose-Capillary: 145 mg/dL — ABNORMAL HIGH (ref 65–99)
Glucose-Capillary: 162 mg/dL — ABNORMAL HIGH (ref 65–99)

## 2017-08-24 LAB — BASIC METABOLIC PANEL
Anion gap: 13 (ref 5–15)
BUN: 50 mg/dL — AB (ref 6–20)
CALCIUM: 9 mg/dL (ref 8.9–10.3)
CHLORIDE: 97 mmol/L — AB (ref 101–111)
CO2: 32 mmol/L (ref 22–32)
CREATININE: 1.44 mg/dL — AB (ref 0.44–1.00)
GFR calc Af Amer: 47 mL/min — ABNORMAL LOW (ref >60)
GFR calc non Af Amer: 41 mL/min — ABNORMAL LOW (ref >60)
Glucose, Bld: 129 mg/dL — ABNORMAL HIGH (ref 65–99)
Potassium: 3.5 mmol/L (ref 3.5–5.1)
SODIUM: 142 mmol/L (ref 135–145)

## 2017-08-24 LAB — CBC
HCT: 26 % — ABNORMAL LOW (ref 36.0–46.0)
HEMOGLOBIN: 7.9 g/dL — AB (ref 12.0–15.0)
MCH: 23 pg — AB (ref 26.0–34.0)
MCHC: 30.4 g/dL (ref 30.0–36.0)
MCV: 75.8 fL — ABNORMAL LOW (ref 78.0–100.0)
Platelets: 351 10*3/uL (ref 150–400)
RBC: 3.43 MIL/uL — ABNORMAL LOW (ref 3.87–5.11)
RDW: 18 % — AB (ref 11.5–15.5)
WBC: 12.7 10*3/uL — ABNORMAL HIGH (ref 4.0–10.5)

## 2017-08-24 LAB — POCT I-STAT 3, ART BLOOD GAS (G3+)
ACID-BASE EXCESS: 15 mmol/L — AB (ref 0.0–2.0)
BICARBONATE: 40.9 mmol/L — AB (ref 20.0–28.0)
O2 Saturation: 91 %
PO2 ART: 61 mmHg — AB (ref 83.0–108.0)
TCO2: 43 mmol/L — AB (ref 22–32)
pCO2 arterial: 58.3 mmHg — ABNORMAL HIGH (ref 32.0–48.0)
pH, Arterial: 7.455 — ABNORMAL HIGH (ref 7.350–7.450)

## 2017-08-24 LAB — MAGNESIUM: Magnesium: 2.5 mg/dL — ABNORMAL HIGH (ref 1.7–2.4)

## 2017-08-24 LAB — LEGIONELLA PNEUMOPHILA SEROGP 1 UR AG: L. pneumophila Serogp 1 Ur Ag: NEGATIVE

## 2017-08-24 LAB — PHOSPHORUS: Phosphorus: 3.9 mg/dL (ref 2.5–4.6)

## 2017-08-24 MED ORDER — DEXTROSE 5 % IV SOLN
4.0000 mg/h | INTRAVENOUS | Status: DC
  Administered 2017-08-24: 4 mg/h via INTRAVENOUS
  Filled 2017-08-24 (×2): qty 25

## 2017-08-24 MED ORDER — METOLAZONE 10 MG PO TABS
10.0000 mg | ORAL_TABLET | Freq: Once | ORAL | Status: AC
  Administered 2017-08-24: 10 mg
  Filled 2017-08-24: qty 1

## 2017-08-24 MED ORDER — POTASSIUM CHLORIDE 20 MEQ/15ML (10%) PO SOLN
30.0000 meq | Freq: Two times a day (BID) | ORAL | Status: DC
Start: 2017-08-24 — End: 2017-08-26
  Administered 2017-08-24 – 2017-08-25 (×4): 30 meq
  Filled 2017-08-24 (×5): qty 30

## 2017-08-24 MED ORDER — POTASSIUM CHLORIDE 20 MEQ/15ML (10%) PO SOLN
40.0000 meq | Freq: Every day | ORAL | Status: DC
  Administered 2017-08-24: 40 meq
  Filled 2017-08-24: qty 30

## 2017-08-24 NOTE — Progress Notes (Signed)
Pharmacy Antibiotic Note  Cassie [name] is a 52 y.o. female admitted on 08/17/2017 with ARDS, RVP positive for Influenza A s/p treatment with Tamiflu and actively enrolled in flu-IVIG study. Continues on linezolid and mereopenem for suspected post-viral PNA. WBC stable 12.7, febrile o/n to 102.1. Scr up 1.44 s/p diuresis with Lasix gtt.  Plan: Continue linezolid 600mg  IV q12h Continue meropenem 2gm IV q8h F/u renal fxn, clinical status C&S, de-escalation, LOT  Height: 5\' 4"  (162.6 cm) Weight: 250 lb 7.1 oz (113.6 kg) IBW/kg (Calculated) : 54.7  Temp (24hrs), Avg:100.3 F (37.9 C), Min:98.8 F (37.1 C), Max:102.1 F (38.9 C)  Recent Labs  Lab 08/17/17 1855  08/20/17 0317  08/21/17 0351 08/22/17 0420 08/22/17 1100 08/23/17 0421 08/24/17 0331  WBC  --    < >  --    < > 9.4 9.6 9.7 11.9* 12.7*  CREATININE  --    < >  --    < > 1.25* 1.26* 1.24* 1.25* 1.44*  LATICACIDVEN 1.99*  --  1.1  --   --   --   --   --   --    < > = values in this interval not displayed.    Estimated Creatinine Clearance: 56.5 mL/min (A) (by C-G formula based on SCr of 1.44 mg/dL (H)).    Allergies  Allergen Reactions  . Sulfa Antibiotics     Antimicrobials this admission: 1/23 ceftriaxone > 1/26 1/23 azithromycin > 1/27 1/24 tamiflu > 1/28 1/26 meropenem > 1/26 vancomycin > 1/27 1/29 linezolid >  Microbiology results: 1/23 RVP: Influenza A 1/23 BCx: NGTD 1/25 MRSA PCR: neg 1/26 resp Cx: Candida  Cassie [name] N. Cassie [name], PharmD PGY1 Pharmacy Resident Pager: [phone] 08/24/2017 9:43 AM

## 2017-08-24 NOTE — Research (Signed)
Title: A Randomized, Double-Blind, Placebo-Controlled Dose Ranging Study Evaluating the Safety Pharmacokinetics and Clinical Benefit of FLU-IGIV in Hospitalized Patients with Serious Influenza A infection. IA-001 (ClinicalTrials.gov Identifier: [id number], Protocol No: IA-001, [facility] Protocol #[id number])  RESEARCH SUBJECT. This research study is sponsored by [employer]   ................................................................................................... Clinical Research Coordinator / Research RN note : This visit for Subject Cassie [name] with DOB: 27-Jan-1966 on 08/24/2017 for the above protocol is Visit/Encounter # Day 6 and is for purpose of assessment.  Patient is located in intensive care unit [facility], currently on the ventilator and sedated. Patient is off of paralysis medication since 08/23/2017, but is still unresponsive at the time of this assesment.   In this visit 08/24/2017, ICU nurse reported that the subject is having fever (temperature of 101.2 F ) and has cough (witnessed by ICU nurse). Rest of vital signs, ordinal scale, flu symptoms, and NEW score was performed as per protocol.. Refer to the subjects paper source binder for further documentation of assessments and procedures.     Signed by  [place] Coordinator II [facility]  [place], [place] 1:20 PM 08/24/2017

## 2017-08-24 NOTE — Progress Notes (Signed)
Notified resident Juleen China and [place] about pt blood gas: pH 7.455, pCO2 58.3, pO2 61, bicarb 40.9. No new orders. Will continue to monitor.

## 2017-08-24 NOTE — Progress Notes (Signed)
PULMONARY / CRITICAL CARE MEDICINE   Name: Cassie [name] MRN: [id number] DOB: [birth year]    ADMISSION DATE:  08/17/2017 CONSULTATION DATE:  08/19/2017  REFERRING MD:  Carolin Sicks  CHIEF COMPLAINT:  Dyspnea  HISTORY OF PRESENT ILLNESS:   52 y/o female with minimal past medical history admitted with ARDS from Influenza A. Received Tamiflu, currently enrolled in a phase 2 clinical trial for an influenza investigational agent. She was intubated for airway support and respiratory distress (ARDS) on 1/25. Gradual improvement over the following several days.   SUBJECTIVE:  Urine output remains satisfactory assisted by concentration of her fluids by pharmacy and increased furosemide. Worsening metabolic alkalosis.   VITAL SIGNS: BP 136/65   Pulse 94   Temp 99.6 F (37.6 C) (Oral)   Resp (!) 28   Ht 5\' 4"  (1.626 m)   Wt 250 lb 7.1 oz (113.6 kg)   LMP 08/17/2017 (Exact Date)   SpO2 90%   BMI 42.99 kg/m   HEMODYNAMICS: CVP:  [12 mmHg] 12 mmHg  VENTILATOR SETTINGS: Vent Mode: PRVC FiO2 (%):  [60 %] 60 % Set Rate:  [28 bmp] 28 bmp Vt Set:  [390 mL] 390 mL PEEP:  [10 cmH20-12 cmH20] 10 cmH20 Plateau Pressure:  [28 cmH20-32 cmH20] 31 cmH20  INTAKE / OUTPUT: I/O last 3 completed shifts: In: 4911.4 [I.V.:1863.4; Other:110; NG/GT:2238; IV Piggyback:700] Out: 6950 [Urine:6950]  PHYSICAL EXAMINATION: General: Intubated, sedated, diaphoretic  Neurological: unresponsive to all commands HEENT: PERRL, MM moist Cardiovascular: S1-2 difficult to auscultate, RRR, no murmur auscultated  Pulmonary: inspiratory and expiratory crackles diminished bilaterally GI: Bowel sound present but hypoactive, distended Musculoskeletal: +1 pitting edema bilateral distal upper greater than lower Skin: no rash or ecchymosis observed  LABS: BMET Recent Labs  Lab 08/22/17 1100 08/23/17 0421 08/24/17 0331  NA 137 139 142  K 4.2 4.1 3.5  CL 105 99* 97*  CO2 24 28 32  BUN 31* 34* 50*  CREATININE  1.24* 1.25* 1.44*  GLUCOSE 130* 161* 129*   Electrolytes Recent Labs  Lab 08/21/17 1615  08/22/17 1100 08/23/17 0421 08/24/17 0331  CALCIUM  --    < > 8.7* 8.9 9.0  MG 2.1  --   --  2.1 2.5*  PHOS 3.3  --   --  4.2 3.9   < > = values in this interval not displayed.   CBC Recent Labs  Lab 08/22/17 1100 08/23/17 0421 08/24/17 0331  WBC 9.7 11.9* 12.7*  HGB 8.2* 8.5* 7.9*  HCT 26.3* 27.1* 26.0*  PLT 246 335 351   Coag's Recent Labs  Lab 08/18/17 1613 08/20/17 1142 08/22/17 1100  APTT 37* 31 31  INR 1.10 1.26 1.11   Sepsis Markers Recent Labs  Lab 08/17/17 1855  08/18/17 0756 08/20/17 0317 08/20/17 0325 08/23/17 0827  LATICACIDVEN 1.99*  --   --  1.1  --   --   PROCALCITON  --    < > 0.33  --  1.45 0.87   < > = values in this interval not displayed.   ABG Recent Labs  Lab 08/23/17 0837 08/23/17 0916 08/24/17 0314  PHART 7.505* 7.424 7.455*  PCO2ART 41.1 55.4* 58.3*  PO2ART 145.0* 80.0* 61.0*   Liver Enzymes Recent Labs  Lab 08/19/17 2056 08/20/17 1142 08/22/17 1100  AST 69* 50* 38  ALT 27 24 21   ALKPHOS 69 63 59  BILITOT 0.5 0.8 0.5  ALBUMIN 2.2* 2.0* 1.8*   Cardiac Enzymes No results for input(s): TROPONINI, PROBNP in the last  168 hours.  Glucose Recent Labs  Lab 08/23/17 0739 08/23/17 1124 08/23/17 1556 08/23/17 1931 08/23/17 2328 08/24/17 0325  GLUCAP 136* 120* 140* 139* 152* 122*   Imaging No results found. STUDIES:  1/25 Echo: LVEF 55-60%, Grade 2 DD, mild left atrial dilation  CULTURES: 1/26 resp > Influenza A positive 1/23 blood >  1/23 RSVP > positive for FLU A H1 2009  ANTIBIOTICS: 1/26 vanc >>> 1/27 1/23 azithro >>> 1/27 1/23 ceftriaxone >>> 1/25 1/26 meropenem >>>  1/23 tamiflu >>> 1/29 Linezolid >>>  SIGNIFICANT EVENTS: 1/23 admission 1/25 intubation  LINES/TUBES: 1/25 ETT >>>  1/25 R IJ CVL>>>  1/25 L radial arterial line >>> 1/25  DISCUSSION: 52 y/o female with ARDS from severe community acquired  pneumonia Influenza A.  Possible bacterial superinfection based on multi-lobar infiltrates. Now with worsening multi-organ failure.  Oxygenation somewhat slowly over several days with prolonged use of Nimbex. No longer experiencing O2 desats with minimal movement. Slightly febrile with increase in her WBC count on 1/30 despite escalation of antibiotic therapy to linezolid with the meropenem. CXR indicated slight improvement in her lung opacities on 1/30.  ASSESSMENT / PLAN:  PULMONARY A: ARDS > improving with mild permissible hypercapnia due to metabolic alkalosis  ABG 6.712/45/80/99.8 CXR-indicated mild improvement with regard to the interstitial edema 1/30 P:   Full mechanical vent support >>> ARDS protocol VAP prevention Daily WUA/SBT Consider discontinuation of Nimbex again this am Monitor ABG closely  CARDIOVASCULAR A:  Septic shock resolved BP tolerating diuresis  P:  Maintain MAP > 65 Monitor CVP during diuresis  Tele Monitor hemodynamics  RENAL A:    AKI> improving, urine output improved, Cr slightly worse today at 1.44 P:    Monitor BMP and UOP daily Replace electrolytes as needed Continue diuresis Furosemide gtt decreased to 5mg /hr due to increased Cr and worsening metabolic alkalosis   GASTROINTESTINAL A:   No acute issues P:   Continue tube feeding Pantoprazole for stress ulcer prophylaxis Docusate per tube Dulcolax suppository    HEMATOLOGIC A: Anemia without acute hemorrhage, Hgb decreased today to 7.9 P: Monitor for bleeding Transfuse for Hgb < 7gm/dL  INFECTIOUS A:   Severe diffuse CAP from Influenza A ?Bacterial superinfection?  Leukocytosis- mild 12.7 P:   Expanded antibiotic treatment as above Continue oseltamivir  ENDOCRINE A:   Mild hyperglycemia   P:   SSI moderate  NEUROLOGIC A: Sedation/paralytic need for vent synchrony P: RASS goal: -5 Continue PAD protocol Nimbex continued  FAMILY  - Updates: called her boyfriend  Josh Pardue 1/29 at [place] family meet or Palliative Care meeting due by:  day 7  Kathi Ludwig, MD Resident PCCM Physician Pager #: [phone]  Attending Note:  52 year old female with flu A and super imposed pneumonia who presents to the hospital with respiratory failure and septic shock.  Septic shock resolved, patient is fluid overload on exam with coarse BS diffusely.  I reviewed CXR myself, mild improvement with infiltrate noted.  Discussed with PCCM-NP.  Will decrease lasix to 5 mg/hr.  Replace electrolytes.  Zaroxolyn 10 mg PO daily.  Continue broad spectrum abx.  F/u on cultures.  D/C nimbex.  Will decrease PEEP to 10 and FiO2 to 50%.  The patient is critically ill with multiple organ systems failure and requires high complexity decision making for assessment and support, frequent evaluation and titration of therapies, application of advanced monitoring technologies and extensive interpretation of multiple databases.   Critical Care Time devoted to  patient care services described in this note is  35  Minutes. This time reflects time of care of this signee Dr [name]. This critical care time does not reflect procedure time, or teaching time or supervisory time of PA/NP/Med student/Med Resident etc but could involve care discussion time.  Rush Farmer, M.D. [facility] Pulmonary/Critical Care Medicine. Pager: [phone]. After hours pager: [phone].

## 2017-08-25 ENCOUNTER — Inpatient Hospital Stay (HOSPITAL_COMMUNITY): Payer: Self-pay

## 2017-08-25 LAB — BLOOD GAS, ARTERIAL
Acid-Base Excess: 15.8 mmol/L — ABNORMAL HIGH (ref 0.0–2.0)
Bicarbonate: 40.8 mmol/L — ABNORMAL HIGH (ref 20.0–28.0)
DRAWN BY: 51133
FIO2: 70
O2 SAT: 94.2 %
PCO2 ART: 58.4 mmHg — AB (ref 32.0–48.0)
PEEP: 10 cmH2O
PO2 ART: 75.6 mmHg — AB (ref 83.0–108.0)
Patient temperature: 98.6
RATE: 28 resp/min
VT: 390 mL
pH, Arterial: 7.459 — ABNORMAL HIGH (ref 7.350–7.450)

## 2017-08-25 LAB — CBC
HEMATOCRIT: 27.5 % — AB (ref 36.0–46.0)
HEMOGLOBIN: 8.2 g/dL — AB (ref 12.0–15.0)
MCH: 22.9 pg — AB (ref 26.0–34.0)
MCHC: 29.8 g/dL — ABNORMAL LOW (ref 30.0–36.0)
MCV: 76.8 fL — AB (ref 78.0–100.0)
Platelets: 381 10*3/uL (ref 150–400)
RBC: 3.58 MIL/uL — AB (ref 3.87–5.11)
RDW: 18.3 % — ABNORMAL HIGH (ref 11.5–15.5)
WBC: 14.7 10*3/uL — ABNORMAL HIGH (ref 4.0–10.5)

## 2017-08-25 LAB — COMPREHENSIVE METABOLIC PANEL
ALK PHOS: 75 U/L (ref 38–126)
ALT: 30 U/L (ref 14–54)
AST: 42 U/L — ABNORMAL HIGH (ref 15–41)
Albumin: 2.1 g/dL — ABNORMAL LOW (ref 3.5–5.0)
Anion gap: 13 (ref 5–15)
BILIRUBIN TOTAL: 0.7 mg/dL (ref 0.3–1.2)
BUN: 63 mg/dL — ABNORMAL HIGH (ref 6–20)
CALCIUM: 9.3 mg/dL (ref 8.9–10.3)
CO2: 35 mmol/L — AB (ref 22–32)
CREATININE: 1.2 mg/dL — AB (ref 0.44–1.00)
Chloride: 98 mmol/L — ABNORMAL LOW (ref 101–111)
GFR, EST AFRICAN AMERICAN: 59 mL/min — AB (ref >60)
GFR, EST NON AFRICAN AMERICAN: 51 mL/min — AB (ref >60)
Glucose, Bld: 164 mg/dL — ABNORMAL HIGH (ref 65–99)
Potassium: 4.1 mmol/L (ref 3.5–5.1)
Sodium: 146 mmol/L — ABNORMAL HIGH (ref 135–145)
Total Protein: 6.4 g/dL — ABNORMAL LOW (ref 6.5–8.1)

## 2017-08-25 LAB — GLUCOSE, CAPILLARY
GLUCOSE-CAPILLARY: 143 mg/dL — AB (ref 65–99)
GLUCOSE-CAPILLARY: 154 mg/dL — AB (ref 65–99)
Glucose-Capillary: 152 mg/dL — ABNORMAL HIGH (ref 65–99)
Glucose-Capillary: 162 mg/dL — ABNORMAL HIGH (ref 65–99)
Glucose-Capillary: 116 mg/dL — ABNORMAL HIGH (ref 65–99)
Glucose-Capillary: 131 mg/dL — ABNORMAL HIGH (ref 65–99)

## 2017-08-25 LAB — MAGNESIUM: Magnesium: 2.6 mg/dL — ABNORMAL HIGH (ref 1.7–2.4)

## 2017-08-25 LAB — PHOSPHORUS: PHOSPHORUS: 4.3 mg/dL (ref 2.5–4.6)

## 2017-08-25 MED ORDER — ACETAZOLAMIDE SODIUM 500 MG IJ SOLR
250.0000 mg | Freq: Three times a day (TID) | INTRAMUSCULAR | Status: AC
  Administered 2017-08-25 (×2): 250 mg via INTRAVENOUS
  Filled 2017-08-25 (×2): qty 250

## 2017-08-25 MED ORDER — SODIUM CHLORIDE 0.9 % IV SOLN
2.0000 g | Freq: Three times a day (TID) | INTRAVENOUS | Status: AC
  Administered 2017-08-26 – 2017-08-27 (×6): 2 g via INTRAVENOUS
  Filled 2017-08-25 (×6): qty 2

## 2017-08-25 MED ORDER — FREE WATER
250.0000 mL | Status: DC
  Administered 2017-08-25 – 2017-08-30 (×30): 250 mL

## 2017-08-25 MED ORDER — VANCOMYCIN HCL IN DEXTROSE 1-5 GM/200ML-% IV SOLN
INTRAVENOUS | Status: AC
  Filled 2017-08-25: qty 200

## 2017-08-25 MED ORDER — METOLAZONE 10 MG PO TABS
10.0000 mg | ORAL_TABLET | Freq: Once | ORAL | Status: AC
  Administered 2017-08-25: 10 mg via ORAL
  Filled 2017-08-25: qty 1

## 2017-08-25 MED ORDER — HYDRALAZINE HCL 20 MG/ML IJ SOLN
10.0000 mg | Freq: Four times a day (QID) | INTRAMUSCULAR | Status: DC | PRN
  Administered 2017-08-25 – 2017-09-06 (×5): 10 mg via INTRAVENOUS
  Filled 2017-08-25 (×5): qty 1

## 2017-08-25 MED ORDER — FUROSEMIDE 10 MG/ML IJ SOLN
10.0000 mg/h | INTRAVENOUS | Status: AC
  Administered 2017-08-25: 8 mg/h via INTRAVENOUS
  Administered 2017-08-26 – 2017-08-27 (×2): 10 mg/h via INTRAVENOUS
  Filled 2017-08-25: qty 25
  Filled 2017-08-25: qty 20

## 2017-08-25 MED ORDER — ACETAMINOPHEN 500 MG PO TABS
1000.0000 mg | ORAL_TABLET | Freq: Once | ORAL | Status: AC
  Administered 2017-08-25: 1000 mg
  Filled 2017-08-25: qty 2

## 2017-08-25 NOTE — Progress Notes (Signed)
Nutrition Follow-up  DOCUMENTATION CODES:   Obesity unspecified  INTERVENTION:   Continue:  Vital High Protein at 55 ml/h (1320 ml per day)  Provides 1320 kcal, 116 gm protein, 1104 ml free water daily  NUTRITION DIAGNOSIS:   Inadequate oral intake related to inability to eat as evidenced by NPO status.  Ongoing  GOAL:   Provide needs based on ASPEN/SCCM guidelines  Met with TF  MONITOR:   Vent status, TF tolerance, Labs  ASSESSMENT:   52 y/o female PMHx hypothyroidism. Presented w/ SOB/Cough x5 days as well as fever/chills. Worked up for acute respiratory failure w/ hypoxia d/t multifocal PNA and sepsis. Respiratory status continued to decline, developed ARDS and ultimately noninvasive measures failed and was intubated 1/25. Influenza A+. RD consulted for TF.   Discussed patient in ICU rounds and with RN today. Tolerating TF well. Currently receiving Vital High Protein via OGT at 55 ml/h (1320 ml/day) to provide 1320 kcals, 116 gm protein, 1104 ml free water daily. Receiving 250 ml free water flushes every 4 hours. Patient remains intubated on ventilator support Temp (24hrs), Avg:99.2 F (37.3 C), Min:98.6 F (37 C), Max:100.1 F (37.8 C)  Labs reviewed. CBG's: [phone] Medications reviewed and include Colace, KCl.   Diet Order:  Diet NPO time specified  EDUCATION NEEDS:   No education needs have been identified at this time  Skin:  Skin Assessment: Reviewed RN Assessment  Last BM:  1/31  Height:   Ht Readings from Last 1 Encounters:  08/17/17 '5\' 4"'  (1.626 m)    Weight:   Wt Readings from Last 1 Encounters:  08/25/17 247 lb 2.2 oz (112.1 kg)    Ideal Body Weight:  54.54 kg  BMI:  Body mass index is 42.42 kg/m.  Estimated Nutritional Needs:   Kcal:  6578-4696  Protein:  109 gm  Fluid:  2 L   Molli Barrows, RD, LDN, [place] Pager [phone] After Hours Pager [phone]

## 2017-08-25 NOTE — Progress Notes (Signed)
PULMONARY / CRITICAL CARE MEDICINE   Name: Cassie [name] MRN: [id number] DOB: 1965-09-14    ADMISSION DATE:  08/17/2017 CONSULTATION DATE:  08/19/2017  REFERRING MD:  Carolin Sicks  CHIEF COMPLAINT:  Dyspnea  HISTORY OF PRESENT ILLNESS:   52 y/o female with minimal past medical history admitted with ARDS from Influenza A. Received Tamiflu, currently enrolled in a phase 2 clinical trial for an influenza investigational agent. She was intubated for airway support and respiratory distress (ARDS) on 1/25. Gradual improvement over the following several days.   SUBJECTIVE:  Urine output remains satisfactory assisted by concentration of her fluids by pharmacy. She has undergone extensive diuresis with a positive response in her SpO2 and clinical status.   VITAL SIGNS: BP (!) 151/65   Pulse 93   Temp 99.8 F (37.7 C)   Resp (!) 28   Ht 5\' 4"  (1.626 m)   Wt 247 lb 2.2 oz (112.1 kg)   LMP 08/17/2017 (Exact Date)   SpO2 93%   BMI 42.42 kg/m   HEMODYNAMICS:    VENTILATOR SETTINGS: Vent Mode: PRVC FiO2 (%):  [50 %-80 %] 60 % Set Rate:  [28 bmp] 28 bmp Vt Set:  [390 mL] 390 mL PEEP:  [10 cmH20] 10 cmH20 Plateau Pressure:  [29 cmH20-30 cmH20] 30 cmH20  INTAKE / OUTPUT: I/O last 3 completed shifts: In: 3969 [I.V.:589; NG/GT:1980; IV Piggyback:1400] Out: 9528 [Urine:5625]  PHYSICAL EXAMINATION: General: Intubated, sedated  Neurological: moves eyelids in response to voice command HEENT: PERRL, MM moist Cardiovascular: S1-2 difficult to auscultate, RRR, no murmur auscultated  Pulmonary: inspiratory and expiratory crackles diminished bilaterally again today GI: Bowel sound present, distention improving  Musculoskeletal: +1 pitting edema bilateral distal upper greater than lower Skin: no rash or ecchymosis observed  LABS: BMET Recent Labs  Lab 08/23/17 0421 08/24/17 0331 08/25/17 0315  NA 139 142 146*  K 4.1 3.5 4.1  CL 99* 97* 98*  CO2 28 32 35*  BUN 34* 50* 63*   CREATININE 1.25* 1.44* 1.20*  GLUCOSE 161* 129* 164*   Electrolytes Recent Labs  Lab 08/23/17 0421 08/24/17 0331 08/25/17 0315  CALCIUM 8.9 9.0 9.3  MG 2.1 2.5* 2.6*  PHOS 4.2 3.9 4.3   CBC Recent Labs  Lab 08/23/17 0421 08/24/17 0331 08/25/17 0315  WBC 11.9* 12.7* 14.7*  HGB 8.5* 7.9* 8.2*  HCT 27.1* 26.0* 27.5*  PLT 335 351 381   Coag's Recent Labs  Lab 08/18/17 1613 08/20/17 1142 08/22/17 1100  APTT 37* 31 31  INR 1.10 1.26 1.11   Sepsis Markers Recent Labs  Lab 08/20/17 0317 08/20/17 0325 08/23/17 0827  LATICACIDVEN 1.1  --   --   PROCALCITON  --  1.45 0.87   ABG Recent Labs  Lab 08/23/17 0916 08/24/17 0314 08/25/17 0350  PHART 7.424 7.455* 7.459*  PCO2ART 55.4* 58.3* 58.4*  PO2ART 80.0* 61.0* 75.6*   Liver Enzymes Recent Labs  Lab 08/20/17 1142 08/22/17 1100 08/25/17 0315  AST 50* 38 42*  ALT 24 21 30   ALKPHOS 63 59 75  BILITOT 0.8 0.5 0.7  ALBUMIN 2.0* 1.8* 2.1*   Cardiac Enzymes No results for input(s): TROPONINI, PROBNP in the last 168 hours.  Glucose Recent Labs  Lab 08/24/17 1143 08/24/17 1511 08/24/17 1953 08/24/17 2330 08/25/17 0321 08/25/17 0717  GLUCAP 170* 148* 145* 162* 152* 143*   Imaging Dg Chest Port 1 View  Result Date: 08/25/2017 CLINICAL DATA:  52 year old female with ARDS from influenza type A. EXAM: PORTABLE CHEST 1 VIEW  COMPARISON:  08/24/2017 and earlier. FINDINGS: Portable AP semi upright view at 0417 hrs. Stable endotracheal tube and enteric tube. Stable right IJ central line. The patient remains mildly rotated to the left. Stable lung volumes. Stable cardiac size and mediastinal contours. Continued dense retrocardiac opacity with perihilar air bronchograms, greater on the left. Patchy and indistinct bilateral perihilar and upper lung opacity. Overall ventilation is stable since 08/22/2017. No superimposed pneumothorax.  Probable bilateral pleural effusions. Paucity bowel gas in the upper abdomen.  IMPRESSION: 1.  Stable lines and tubes. 2. Continued stable ventilation. Bilateral basilar predominant consolidation. Likely left and possible right pleural effusions. Electronically Signed   By: Genevie Ann M.D.   On: 08/25/2017 08:19   STUDIES:  1/25 Echo: LVEF 55-60%, Grade 2 DD, mild left atrial dilation  CULTURES: 1/26 resp > Influenza A positive 1/23 blood >  1/23 RSVP > positive for FLU A H1 2009  ANTIBIOTICS: 1/26 vanc >>> 1/27 1/23 azithro >>> 1/27 1/23 ceftriaxone >>> 1/25 1/26 meropenem >>>  1/23 tamiflu >>> 1/29 Linezolid >>>  SIGNIFICANT EVENTS: 1/23 admission 1/25 intubation  LINES/TUBES: 1/25 ETT >>>  1/25 R IJ CVL>>>  1/25 L radial arterial line >>> 1/25  DISCUSSION: 52 y/o female with ARDS from severe community acquired pneumonia Influenza A.  Possible bacterial superinfection based on multi-lobar infiltrates. Now with worsening multi-organ failure.  Oxygenation somewhat slowly over several days with prolonged use of Nimbex. No longer experiencing O2 desats with minimal movement. Slightly febrile with increase in her WBC count on 1/30 despite escalation of antibiotic therapy to linezolid with the meropenem. CXR indicated slight improvement in her lung opacities on 1/30.   ASSESSMENT / PLAN:  PULMONARY A: ARDS > improving with mild permissible hypercapnia due to metabolic alkalosis  ABG 5.40/08/67/61 on 08/25/2017 CXR-stable P:   Full mechanical vent support >>> ARDS protocol VAP prevention Daily WUA/SBT Monitor ABG closely Wean as tolerated   CARDIOVASCULAR A:  Septic shock resolved BP tolerating diuresis  P:  Maintain MAP > 65 Monitor CVP during diuresis Tele Monitor hemodynamics  RENAL A:    AKI> improving, urine output improved Hypokalemia resolved  P:    Monitor BMP and UOP daily Replace electrolytes as needed Continue diuresis Potassium daily Furosemide gtt decreased to 4mg /hr Consider Carbonic anhydrase inhibitor    GASTROINTESTINAL A:   No acute issues P:   Continue tube feeding Pantoprazole for stress ulcer prophylaxis Docusate per tube Dulcolax suppository    HEMATOLOGIC A: Anemia without acute hemorrhage, Hgb stable P: Monitor for bleeding Transfuse for Hgb < 7gm/dL  INFECTIOUS A:   Severe diffuse CAP from Influenza A ?Bacterial superinfection?  Leukocytosis- mild and slightly worse today P:   Antibiotic treatment as above Continue oseltamivir  ENDOCRINE A:   Mild hyperglycemia   P:   SSI moderate  NEUROLOGIC A: Sedation need for vent synchrony P: RASS goal: -1/-2 Continue PAD protocol Nimbex continued Fentanyl ggt for RASS goal as above  FAMILY  - Updates: Boyfriend updated at bedside, no other family available - Inter-disciplinary family meet or Palliative Care meeting due by:  day 7  Kathi Ludwig, MD Resident PCCM Physician Pager #: [phone]  Attending Note:  52 year old female with Flu A and ARDS, respiratory failure, improving with diureses.  On exam, coarse BS diffusely.  I reviewed CXR myself, infiltrate noted and ETT is in good position.  Discussed with PCCM-NP.  Will increase diureses.  Continue full vent support for now.  Continue abx for now.  F/u on cultures.  Will f/u.  The patient is critically ill with multiple organ systems failure and requires high complexity decision making for assessment and support, frequent evaluation and titration of therapies, application of advanced monitoring technologies and extensive interpretation of multiple databases.   Critical Care Time devoted to patient care services described in this note is  35  Minutes. This time reflects time of care of this signee Dr [name]. This critical care time does not reflect procedure time, or teaching time or supervisory time of PA/NP/Med student/Med Resident etc but could involve care discussion time.  Rush Farmer, M.D. [facility] Pulmonary/Critical Care  Medicine. Pager: [phone]. After hours pager: [phone].

## 2017-08-26 ENCOUNTER — Inpatient Hospital Stay (HOSPITAL_COMMUNITY): Payer: Self-pay

## 2017-08-26 LAB — BASIC METABOLIC PANEL
ANION GAP: 13 (ref 5–15)
BUN: 65 mg/dL — ABNORMAL HIGH (ref 6–20)
CALCIUM: 9.9 mg/dL (ref 8.9–10.3)
CO2: 37 mmol/L — ABNORMAL HIGH (ref 22–32)
Chloride: 96 mmol/L — ABNORMAL LOW (ref 101–111)
Creatinine, Ser: 1.12 mg/dL — ABNORMAL HIGH (ref 0.44–1.00)
GFR, EST NON AFRICAN AMERICAN: 55 mL/min — AB (ref >60)
GLUCOSE: 145 mg/dL — AB (ref 65–99)
POTASSIUM: 3.5 mmol/L (ref 3.5–5.1)
SODIUM: 146 mmol/L — AB (ref 135–145)

## 2017-08-26 LAB — BLOOD GAS, ARTERIAL
Acid-Base Excess: 14.8 mmol/L — ABNORMAL HIGH (ref 0.0–2.0)
BICARBONATE: 39.8 mmol/L — AB (ref 20.0–28.0)
Drawn by: 418751
FIO2: 50
O2 Saturation: 91.3 %
PATIENT TEMPERATURE: 98.6
PCO2 ART: 58.9 mmHg — AB (ref 32.0–48.0)
PEEP: 10 cmH2O
PO2 ART: 65.8 mmHg — AB (ref 83.0–108.0)
RATE: 28 resp/min
VT: 390 mL
pH, Arterial: 7.445 (ref 7.350–7.450)

## 2017-08-26 LAB — GLUCOSE, CAPILLARY
GLUCOSE-CAPILLARY: 139 mg/dL — AB (ref 65–99)
GLUCOSE-CAPILLARY: 118 mg/dL — AB (ref 65–99)
GLUCOSE-CAPILLARY: 130 mg/dL — AB (ref 65–99)
Glucose-Capillary: 143 mg/dL — ABNORMAL HIGH (ref 65–99)
Glucose-Capillary: 144 mg/dL — ABNORMAL HIGH (ref 65–99)

## 2017-08-26 LAB — MAGNESIUM: MAGNESIUM: 2.5 mg/dL — AB (ref 1.7–2.4)

## 2017-08-26 LAB — CBC
HCT: 27.7 % — ABNORMAL LOW (ref 36.0–46.0)
Hemoglobin: 8.1 g/dL — ABNORMAL LOW (ref 12.0–15.0)
MCH: 23.2 pg — ABNORMAL LOW (ref 26.0–34.0)
MCHC: 29.2 g/dL — ABNORMAL LOW (ref 30.0–36.0)
MCV: 79.4 fL (ref 78.0–100.0)
PLATELETS: 427 10*3/uL — AB (ref 150–400)
RBC: 3.49 MIL/uL — AB (ref 3.87–5.11)
RDW: 18.7 % — AB (ref 11.5–15.5)
WBC: 13.5 10*3/uL — AB (ref 4.0–10.5)

## 2017-08-26 LAB — PHOSPHORUS: Phosphorus: 4.9 mg/dL — ABNORMAL HIGH (ref 2.5–4.6)

## 2017-08-26 MED ORDER — FUROSEMIDE 10 MG/ML IJ SOLN: 10.0000 mg/h | INTRAMUSCULAR | Status: DC

## 2017-08-26 MED ORDER — METOLAZONE 10 MG PO TABS
10.0000 mg | ORAL_TABLET | Freq: Once | ORAL | Status: AC
  Administered 2017-08-26: 10 mg via ORAL
  Filled 2017-08-26: qty 1

## 2017-08-26 MED ORDER — POTASSIUM CHLORIDE 20 MEQ/15ML (10%) PO SOLN
40.0000 meq | Freq: Three times a day (TID) | ORAL | Status: AC
  Administered 2017-08-26 (×2): 40 meq
  Filled 2017-08-26: qty 30

## 2017-08-26 MED ORDER — ACETAZOLAMIDE SODIUM 500 MG IJ SOLR
250.0000 mg | Freq: Four times a day (QID) | INTRAMUSCULAR | Status: AC
  Administered 2017-08-26 (×3): 250 mg via INTRAVENOUS
  Filled 2017-08-26 (×3): qty 250

## 2017-08-26 NOTE — Care Management Note (Signed)
Case Management Note  Patient Details  Name: Cassie [name] MRN: [id number] Date of Birth: Mar 14, 1966  Subjective/Objective:      Pt admitted with ARDs secondary to flu              Action/Plan:  From home.  Still requiring ventilator    Expected Discharge Date:                  Expected Discharge Plan:  Home/Self Care  In-House Referral:     Discharge planning Services  CM Consult, Medication Assistance  Post Acute Care Choice:    Choice offered to:     DME Arranged:    DME Agency:     HH Arranged:    HH Agency:     Status of Service:  In process, will continue to follow  If discussed at Long Length of Stay Meetings, dates discussed:    Additional Comments:  [name], RN 08/26/2017, 3:51 PM

## 2017-08-26 NOTE — Progress Notes (Signed)
PULMONARY / CRITICAL CARE MEDICINE   Name: Cassie [name] MRN: [id number] DOB: Jan 14, 1966    ADMISSION DATE:  08/17/2017 CONSULTATION DATE:  08/19/2017  REFERRING MD:  Carolin Sicks  CHIEF COMPLAINT:  Dyspnea  HISTORY OF PRESENT ILLNESS:   52 y/o female with minimal past medical history admitted with ARDS from Influenza A. Received Tamiflu, currently enrolled in a phase 2 clinical trial for an influenza investigational agent. She was intubated for airway support and respiratory distress (ARDS) on 1/25. Gradual improvement over the following several days.   SUBJECTIVE:  No events overnight   VITAL SIGNS: BP 123/74   Pulse 90   Temp 99.2 F (37.3 C) (Oral)   Resp (!) 28   Ht 5\' 4"  (1.626 m)   Wt 110.6 kg (243 lb 13.3 oz)   LMP 08/17/2017 (Exact Date)   SpO2 92%   BMI 41.85 kg/m   HEMODYNAMICS:    VENTILATOR SETTINGS: Vent Mode: PRVC FiO2 (%):  [50 %-60 %] 50 % Set Rate:  [28 bmp] 28 bmp Vt Set:  [390 mL] 390 mL PEEP:  [10 cmH20] 10 cmH20 Plateau Pressure:  [27 cmH20-30 cmH20] 28 cmH20  INTAKE / OUTPUT: I/O last 3 completed shifts: In: 0865 [I.V.:526; NG/GT:2730; IV Piggyback:900] Out: 7846 [Urine:6850]  PHYSICAL EXAMINATION: General: Intubated, sedated  Neurological: moves eyelids in response to voice command HEENT: PERRL, MM moist Cardiovascular: RRR, Nl S1/S2 and -M/R/G. Pulmonary: inspiratory and expiratory crackles diminished bilaterally again today GI: Bowel sound present, distention improving  Musculoskeletal: +1 pitting edema bilateral distal upper greater than lower Skin: no rash or ecchymosis observed  LABS: BMET Recent Labs  Lab 08/24/17 0331 08/25/17 0315 08/26/17 0523  NA 142 146* 146*  K 3.5 4.1 3.5  CL 97* 98* 96*  CO2 32 35* 37*  BUN 50* 63* 65*  CREATININE 1.44* 1.20* 1.12*  GLUCOSE 129* 164* 145*   Electrolytes Recent Labs  Lab 08/24/17 0331 08/25/17 0315 08/26/17 0523  CALCIUM 9.0 9.3 9.9  MG 2.5* 2.6* 2.5*  PHOS 3.9 4.3 4.9*    CBC Recent Labs  Lab 08/24/17 0331 08/25/17 0315 08/26/17 0523  WBC 12.7* 14.7* 13.5*  HGB 7.9* 8.2* 8.1*  HCT 26.0* 27.5* 27.7*  PLT 351 381 427*   Coag's Recent Labs  Lab 08/20/17 1142 08/22/17 1100  APTT 31 31  INR 1.26 1.11   Sepsis Markers Recent Labs  Lab 08/20/17 0317 08/20/17 0325 08/23/17 0827  LATICACIDVEN 1.1  --   --   PROCALCITON  --  1.45 0.87   ABG Recent Labs  Lab 08/24/17 0314 08/25/17 0350 08/26/17 0255  PHART 7.455* 7.459* 7.445  PCO2ART 58.3* 58.4* 58.9*  PO2ART 61.0* 75.6* 65.8*   Liver Enzymes Recent Labs  Lab 08/20/17 1142 08/22/17 1100 08/25/17 0315  AST 50* 38 42*  ALT 24 21 30   ALKPHOS 63 59 75  BILITOT 0.8 0.5 0.7  ALBUMIN 2.0* 1.8* 2.1*   Cardiac Enzymes No results for input(s): TROPONINI, PROBNP in the last 168 hours.  Glucose Recent Labs  Lab 08/25/17 1205 08/25/17 1530 08/25/17 1920 08/25/17 2315 08/26/17 0338 08/26/17 0733  GLUCAP 162* 116* 131* 154* 139* 143*   Imaging Dg Chest Port 1 View  Result Date: 08/26/2017 CLINICAL DATA:  Followup ventilator support. EXAM: PORTABLE CHEST 1 VIEW COMPARISON:  08/25/2017 FINDINGS: Endotracheal tube tip is 2 cm above the carina. Nasogastric tube enters the stomach. Right internal jugular central line tip in the SVC above the right atrium. Diffuse airspace filling pattern continues to  worsen consistent with diffuse pneumonia and/or edema. This is most pronounced in the lower lobes. No visible effusion. IMPRESSION: Lines and tubes well positioned. Continued worsening of diffuse alveolar filling pattern consistent with pneumonia and/or edema. Findings more severe in the lower lobes. Electronically Signed   By: Nelson Chimes M.D.   On: 08/26/2017 07:43   STUDIES:  1/25 Echo: LVEF 55-60%, Grade 2 DD, mild left atrial dilation  CULTURES: 1/26 resp > Influenza A positive 1/23 blood >  1/23 RSVP > positive for FLU A H1 2009  ANTIBIOTICS: 1/26 vanc >>> 1/27 1/23 azithro >>>  1/27 1/23 ceftriaxone >>> 1/25 1/26 meropenem >>> 2/2 1/23 tamiflu >>>1/29 1/29 Linezolid >>>2/2  SIGNIFICANT EVENTS: 1/23 admission 1/25 intubation  LINES/TUBES: 1/25 ETT >>>  1/25 R IJ CVL>>>  1/25 L radial arterial line >>> 1/25  DISCUSSION: 52 y/o female with ARDS from severe community acquired pneumonia Influenza A.  Possible bacterial superinfection based on multi-lobar infiltrates. Now with worsening multi-organ failure.  Oxygenation somewhat slowly over several days with prolonged use of Nimbex. No longer experiencing O2 desats with minimal movement. Slightly febrile with increase in her WBC count on 1/30 despite escalation of antibiotic therapy to linezolid with the meropenem. CXR indicated slight improvement in her lung opacities on 1/30.   ASSESSMENT / PLAN:  PULMONARY A: ARDS > improving with mild permissible hypercapnia due to metabolic alkalosis  ABG 4.00/86/76/19 on 08/25/2017 CXR-stable P:   Full mechanical vent support >>> ARDS protocol VAP prevention Daily WUA/SBT when stable ABG in AM Wean as tolerated  Maintain PEEP at 10 Volume overload - lasix  CARDIOVASCULAR A:  Septic shock resolved BP tolerating diuresis  P:  Maintain MAP > 65 Monitor CVP during diuresis Tele Monitor hemodynamics  RENAL A:    AKI> improving, urine output improved Hypokalemia resolved  P:    Monitor BMP and UOP daily Replace electrolytes as needed Lasix 10 mg/hr KCl PO given  GASTROINTESTINAL A:   No acute issues P:   Continue tube feeding Pantoprazole for stress ulcer prophylaxis Docusate per tube Dulcolax suppository    HEMATOLOGIC A: Anemia without acute hemorrhage, Hgb stable P: Monitor for bleeding Transfuse for Hgb < 7gm/dL  INFECTIOUS A:   Severe diffuse CAP from Influenza A ?Bacterial superinfection?  Leukocytosis- mild and slightly worse today P:   Antibiotic treatment as above D/C oseltamivir 1/29  ENDOCRINE A:   Mild hyperglycemia   P:    SSI moderate  NEUROLOGIC A: Sedation need for vent synchrony P: RASS goal: -1/-2 Continue PAD protocol Nimbex continued Fentanyl ggt for RASS goal as above  FAMILY  - Updates: No family bedside 2/1  - Inter-disciplinary family meet or Palliative Care meeting due by:  day 7  The patient is critically ill with multiple organ systems failure and requires high complexity decision making for assessment and support, frequent evaluation and titration of therapies, application of advanced monitoring technologies and extensive interpretation of multiple databases.   Critical Care Time devoted to patient care services described in this note is  35  Minutes. This time reflects time of care of this signee Dr [name]. This critical care time does not reflect procedure time, or teaching time or supervisory time of PA/NP/Med student/Med Resident etc but could involve care discussion time.  Rush Farmer, M.D. [facility] Pulmonary/Critical Care Medicine. Pager: [phone]. After hours pager: [phone].

## 2017-08-26 NOTE — Research (Signed)
Title: A Randomized, Double-Blind, Placebo-Controlled Dose Ranging Study Evaluating the Safety Pharmacokinetics and Clinical Benefit of FLU-IGIV in Hospitalized Patients with Serious Influenza A infection. IA-001 (ClinicalTrials.gov Identifier: [id number], Protocol No: IA-001, [facility] Protocol #[id number])  RESEARCH SUBJECT. This research study is sponsored by [employer]   ................................................................................................... Clinical Research Coordinator / Research RN note : This visit for Subject Cassie [name] with DOB: 09/19/1965 on 08/26/2017 for the above protocol is Visit/Encounter # Day 8 and is for purpose of assesment and procedures. The consent for this encounter is under Protocol Version 4 and  is currently IRB approved.   Patient is located in intensive care unit [facility], currently on the ventilator.   In this visit 08/26/2017, ICU nurse reported that the subject has cough. Patient is having fever (temperature of 100.1). PK samples were obtained and NP swabs was collected during this visit. While performing NP swab patient was responsive to swab. Patient is responsive to pain.  Rest of vital signs, ordinal scale, flu symptoms, and NEW score was performed as per protocol.  Refer to the subjects paper source binder for further documentation of assessments and procedures.    Signed by  [place] Coordinator [facility]  [place], [place] 9:26 AM 08/26/2017

## 2017-08-27 ENCOUNTER — Inpatient Hospital Stay (HOSPITAL_COMMUNITY): Payer: Self-pay

## 2017-08-27 LAB — BLOOD GAS, ARTERIAL
ACID-BASE EXCESS: 13.8 mmol/L — AB (ref 0.0–2.0)
Bicarbonate: 38.9 mmol/L — ABNORMAL HIGH (ref 20.0–28.0)
Drawn by: 28340
FIO2: 50
O2 SAT: 93.8 %
PEEP/CPAP: 10 cmH2O
PH ART: 7.419 (ref 7.350–7.450)
Patient temperature: 100.8
RATE: 28 resp/min
VT: 390 mL
pCO2 arterial: 62.2 mmHg — ABNORMAL HIGH (ref 32.0–48.0)
pO2, Arterial: 81 mmHg — ABNORMAL LOW (ref 83.0–108.0)

## 2017-08-27 LAB — CBC
HEMATOCRIT: 37.5 % (ref 36.0–46.0)
HEMOGLOBIN: 11.4 g/dL — AB (ref 12.0–15.0)
MCH: 23.9 pg — ABNORMAL LOW (ref 26.0–34.0)
MCHC: 30.4 g/dL (ref 30.0–36.0)
MCV: 78.8 fL (ref 78.0–100.0)
Platelets: 362 10*3/uL (ref 150–400)
RBC: 4.76 MIL/uL (ref 3.87–5.11)
RDW: 19.1 % — ABNORMAL HIGH (ref 11.5–15.5)
WBC: 11.1 10*3/uL — AB (ref 4.0–10.5)

## 2017-08-27 LAB — PHOSPHORUS: PHOSPHORUS: 5 mg/dL — AB (ref 2.5–4.6)

## 2017-08-27 LAB — GLUCOSE, CAPILLARY
GLUCOSE-CAPILLARY: 126 mg/dL — AB (ref 65–99)
GLUCOSE-CAPILLARY: 137 mg/dL — AB (ref 65–99)
GLUCOSE-CAPILLARY: 139 mg/dL — AB (ref 65–99)
GLUCOSE-CAPILLARY: 139 mg/dL — AB (ref 65–99)
Glucose-Capillary: 122 mg/dL — ABNORMAL HIGH (ref 65–99)
Glucose-Capillary: 133 mg/dL — ABNORMAL HIGH (ref 65–99)

## 2017-08-27 LAB — BASIC METABOLIC PANEL
ANION GAP: 15 (ref 5–15)
BUN: 67 mg/dL — ABNORMAL HIGH (ref 6–20)
CHLORIDE: 94 mmol/L — AB (ref 101–111)
CO2: 34 mmol/L — AB (ref 22–32)
Calcium: 9.8 mg/dL (ref 8.9–10.3)
Creatinine, Ser: 1.08 mg/dL — ABNORMAL HIGH (ref 0.44–1.00)
GFR calc non Af Amer: 58 mL/min — ABNORMAL LOW (ref >60)
Glucose, Bld: 127 mg/dL — ABNORMAL HIGH (ref 65–99)
Potassium: 3.2 mmol/L — ABNORMAL LOW (ref 3.5–5.1)
SODIUM: 143 mmol/L (ref 135–145)

## 2017-08-27 LAB — MAGNESIUM: MAGNESIUM: 2.6 mg/dL — AB (ref 1.7–2.4)

## 2017-08-27 MED ORDER — SODIUM CHLORIDE 0.9 % IV SOLN
INTRAVENOUS | Status: DC
  Administered 2017-08-27 – 2017-09-03 (×4): via INTRAVENOUS

## 2017-08-27 MED ORDER — POTASSIUM CHLORIDE 20 MEQ/15ML (10%) PO SOLN
40.0000 meq | Freq: Once | ORAL | Status: AC
  Administered 2017-08-27: 40 meq
  Filled 2017-08-27: qty 30

## 2017-08-27 NOTE — Progress Notes (Signed)
PULMONARY / CRITICAL CARE MEDICINE   Name: Cassie [name] MRN: [id number] DOB: 20-Jul-1966    ADMISSION DATE:  08/17/2017 CONSULTATION DATE:  08/19/2017  REFERRING MD:  Carolin Sicks  CHIEF COMPLAINT:  Dyspnea  HISTORY OF PRESENT ILLNESS:   52 y/o female with minimal past medical history admitted with ARDS from Influenza A. Received Tamiflu, currently enrolled in a phase 2 clinical trial for an influenza investigational agent. She was intubated for airway support and respiratory distress (ARDS) on 1/25. Gradual improvement over the following several days.   SUBJECTIVE:  Not weaning .  Wt tr down w/ neg bal on Lasix drip     VITAL SIGNS: BP 102/62   Pulse 82   Temp 99 F (37.2 C) (Core)   Resp (!) 28   Ht _0  (1.626 m)   Wt 238 lb 1.6 oz (108 kg)   LMP 08/17/2017 (Exact Date)   SpO2 92%   BMI 40.87 kg/m   HEMODYNAMICS:    VENTILATOR SETTINGS: Vent Mode: PRVC FiO2 (%):  [50 %] 50 % Set Rate:  [28 bmp] 28 bmp Vt Set:  [390 mL] 390 mL PEEP:  [10 cmH20] 10 cmH20 Plateau Pressure:  [27 cmH20-28 cmH20] 27 cmH20  INTAKE / OUTPUT: I/O last 3 completed shifts: In: 5801.5 [I.V.:671.5; Other:110; NG/GT:3720; IV Piggyback:1300] Out: 7760 [Urine:7060; Emesis/NG output:700]  PHYSICAL EXAMINATION: General: Intubated, sedated , obese  Neurological: moves eyelids in response to voice command, not follow commands HEENT: PERRL, ETT  Cardiovascular: RRR, Nl S1/S2 and -M/R/G. Pulmonary: BB crackles  GI:  BS + , TF  Musculoskeletal: +1 pitting edema bilateral distal upper greater than lower Skin: no rash or ecchymosis observed  LABS: BMET Recent Labs  Lab 08/25/17 0315 08/26/17 0523 08/27/17 0439  NA 146* 146* 143  K 4.1 3.5 3.2*  CL 98* 96* 94*  CO2 35* 37* 34*  BUN 63* 65* 67*  CREATININE 1.20* 1.12* 1.08*  GLUCOSE 164* 145* 127*   Electrolytes Recent Labs  Lab 08/25/17 0315 08/26/17 0523 08/27/17 0439  CALCIUM 9.3 9.9 9.8  MG 2.6* 2.5* 2.6*  PHOS 4.3 4.9*  5.0*   CBC Recent Labs  Lab 08/25/17 0315 08/26/17 0523 08/27/17 0439  WBC 14.7* 13.5* 11.1*  HGB 8.2* 8.1* 11.4*  HCT 27.5* 27.7* 37.5  PLT 381 427* 362   Coag's Recent Labs  Lab 08/20/17 1142 08/22/17 1100  APTT 31 31  INR 1.26 1.11   Sepsis Markers Recent Labs  Lab 08/23/17 0827  PROCALCITON 0.87   ABG Recent Labs  Lab 08/25/17 0350 08/26/17 0255 08/27/17 0320  PHART 7.459* 7.445 7.419  PCO2ART 58.4* 58.9* 62.2*  PO2ART 75.6* 65.8* 81.0*   Liver Enzymes Recent Labs  Lab 08/20/17 1142 08/22/17 1100 08/25/17 0315  AST 50* 38 42*  ALT _1 ALKPHOS 63 59 75  BILITOT 0.8 0.5 0.7  ALBUMIN 2.0* 1.8* 2.1*   Cardiac Enzymes No results for input(s): TROPONINI, PROBNP in the last 168 hours.  Glucose Recent Labs  Lab 08/26/17 1123 08/26/17 1514 08/26/17 1935 08/26/17 2337 08/27/17 0406 08/27/17 0751  GLUCAP 144* 118* 130* 139* 139* 122*   Imaging Dg Chest Port 1 View  Result Date: 08/27/2017 CLINICAL DATA:  Intubated. EXAM: PORTABLE CHEST 1 VIEW COMPARISON:  08/26/2017 FINDINGS: Endotracheal tube terminates 2.5 cm above the carina. Right jugular catheter terminates over the mid SV Enteric tube courses into the stomach with tip not imaged. The cardiac silhouette remains enlarged. Diffuse airspace opacities throughout the left mid  and left lower lung and milder opacities on the right have not significantly changed. No large pleural effusion or pneumothorax is identified. IMPRESSION: Left greater than right lung airspace opacities without significant interval change and which may reflect pneumonia or edema. Electronically Signed   By: Logan Bores M.D.   On: 08/27/2017 07:48   STUDIES:  1/25 Echo: LVEF 55-60%, Grade 2 DD, mild left atrial dilation  CULTURES: 1/26 resp > Influenza A positive 1/23 blood > NEG  1/23 RSVP > positive for FLU A H1 2009  ANTIBIOTICS: 1/26 vanc >>> 1/27 1/23 azithro >>> 1/27 1/23 ceftriaxone >>> 1/25 1/26 meropenem >>>  2/2 1/23 tamiflu >>>1/29 1/29 Linezolid >>>2/2  SIGNIFICANT EVENTS: 1/23 admission 1/25 intubation  LINES/TUBES: 1/25 ETT >>>  1/25 R IJ CVL>>>  1/25 L radial arterial line >>> 1/25  DISCUSSION: 52 y/o female with ARDS from severe community acquired pneumonia Influenza A.  Possible bacterial superinfection based on multi-lobar infiltrates. Now with worsening multi-organ failure.  Oxygenation demands are slowly decreasing . No longer experiencing O2 desats with minimal movement.  CXR stable 2/2 .   ASSESSMENT / PLAN:  PULMONARY A: ARDS > improving with mild permissible hypercapnia due to metabolic alkalosis and diuresis   CXR-stable P:   Full mechanical vent support >>> ARDS protocol VAP prevention Daily WUA/SBT when stable ABG in am  Wean as tolerated  Maintain PEEP at 10 Volume overload - lasix as b/p and kidney fxn allows   CARDIOVASCULAR A:  Septic shock resolved BP tolerating diuresis  P:  Maintain MAP > 65 Monitor CVP during diuresis Tele Monitor hemodynamics  RENAL A:    AKI> improving, urine output improved Hypokalemia  P:    Monitor BMP and UOP daily Replace electrolytes as needed Lasix 10 mg/hr Replace K   GASTROINTESTINAL A:   Nutrition  P:   Continue tube feeding Pantoprazole for stress ulcer prophylaxis Docusate per tube Dulcolax suppository    HEMATOLOGIC A: Anemia without acute hemorrhage, Hgb stable P: Monitor for bleeding Transfuse for Hgb < 7gm/dL  INFECTIOUS A:   Severe diffuse CAP from Influenza A ?Bacterial superinfection?  Leukocytosis- mild and slightly worse today P:   Antibiotic treatment as above D/C oseltamivir 1/29  ENDOCRINE A:   Mild hyperglycemia   P:   SSI moderate  NEUROLOGIC A: Sedation need for vent synchrony P: RASS goal: -1/-2 Continue PAD protocol Nimbex off 1/29  Versed /Fentanyl ggt for RASS goal as above- try to wean off Versed drip   FAMILY  - Updates: No family bedside 2/2   -  Inter-disciplinary family meet or Palliative Care meeting due by:  day 7  Tammy Parrett NP-C  [facility]  [phone]    08/27/2017

## 2017-08-28 ENCOUNTER — Inpatient Hospital Stay (HOSPITAL_COMMUNITY): Payer: Self-pay

## 2017-08-28 LAB — BASIC METABOLIC PANEL
Anion gap: 11 (ref 5–15)
BUN: 67 mg/dL — AB (ref 6–20)
CALCIUM: 9.5 mg/dL (ref 8.9–10.3)
CO2: 35 mmol/L — ABNORMAL HIGH (ref 22–32)
CREATININE: 0.97 mg/dL (ref 0.44–1.00)
Chloride: 95 mmol/L — ABNORMAL LOW (ref 101–111)
GFR calc non Af Amer: >60 mL/min (ref >60)
Glucose, Bld: 125 mg/dL — ABNORMAL HIGH (ref 65–99)
Potassium: 2.7 mmol/L — CL (ref 3.5–5.1)
Sodium: 141 mmol/L (ref 135–145)

## 2017-08-28 LAB — GLUCOSE, CAPILLARY
GLUCOSE-CAPILLARY: 114 mg/dL — AB (ref 65–99)
GLUCOSE-CAPILLARY: 108 mg/dL — AB (ref 65–99)
GLUCOSE-CAPILLARY: 128 mg/dL — AB (ref 65–99)
Glucose-Capillary: 109 mg/dL — ABNORMAL HIGH (ref 65–99)
Glucose-Capillary: 112 mg/dL — ABNORMAL HIGH (ref 65–99)
Glucose-Capillary: 124 mg/dL — ABNORMAL HIGH (ref 65–99)
Glucose-Capillary: 130 mg/dL — ABNORMAL HIGH (ref 65–99)

## 2017-08-28 LAB — CBC
HCT: 27.9 % — ABNORMAL LOW (ref 36.0–46.0)
Hemoglobin: 8.6 g/dL — ABNORMAL LOW (ref 12.0–15.0)
MCH: 24 pg — AB (ref 26.0–34.0)
MCHC: 30.8 g/dL (ref 30.0–36.0)
MCV: 77.7 fL — ABNORMAL LOW (ref 78.0–100.0)
Platelets: 441 10*3/uL — ABNORMAL HIGH (ref 150–400)
RBC: 3.59 MIL/uL — ABNORMAL LOW (ref 3.87–5.11)
RDW: 18.6 % — AB (ref 11.5–15.5)
WBC: 13.2 10*3/uL — ABNORMAL HIGH (ref 4.0–10.5)

## 2017-08-28 MED ORDER — POTASSIUM CHLORIDE 10 MEQ/50ML IV SOLN
10.0000 meq | INTRAVENOUS | Status: AC
  Administered 2017-08-28 (×6): 10 meq via INTRAVENOUS
  Filled 2017-08-28 (×6): qty 50

## 2017-08-28 MED ORDER — POTASSIUM CHLORIDE 20 MEQ PO PACK
40.0000 meq | PACK | Freq: Once | ORAL | Status: AC
  Administered 2017-08-28: 40 meq
  Filled 2017-08-28: qty 2

## 2017-08-28 MED ORDER — FUROSEMIDE 10 MG/ML IJ SOLN
60.0000 mg | Freq: Once | INTRAMUSCULAR | Status: AC
  Administered 2017-08-28: 60 mg via INTRAVENOUS
  Filled 2017-08-28: qty 6

## 2017-08-28 NOTE — Research (Signed)
Title: A Randomized, Double-Blind, Placebo-Controlled Dose Ranging Study Evaluating the Safety Pharmacokinetics and Clinical Benefit of FLU-IGIV in Hospitalized Patients with Serious Influenza A infection. IA-001 (ClinicalTrials.gov Identifier: [id number], Protocol No: IA-001, [facility] Protocol #[id number])  RESEARCH SUBJECT. This research study is sponsored by [employer]   ...................................................................................................  Clinical Research Coordinator / Research RN note : This visit for Subject Cassie [name] with DOB: Jul 23, 1966 on 08/28/2017 for the above protocol is Visit/Encounter # Day 10 and is for purpose of asessment.   [facility]  In this visit02/09/2017,ICU nursereported that thesubject has cough. Patient is not having fever(temperature of 98.3 F). Patient is responsive to pain and I witnessed patient opening her eyes. Rest of vital signs, ordinal scale, flu symptoms, and NEW score was performedas per protocol.  Refer to the subjects paper source binder for further documentation of assessments and procedures.   Signed by [facility]  [place], [place] 11:12 AM 08/28/2017

## 2017-08-28 NOTE — Progress Notes (Signed)
PULMONARY / CRITICAL CARE MEDICINE   Name: Cassie [name] MRN: [id number] DOB: 1965/12/27    ADMISSION DATE:  08/17/2017 CONSULTATION DATE:  08/19/2017  REFERRING MD:  Carolin Sicks  CHIEF COMPLAINT:  Dyspnea  HISTORY OF PRESENT ILLNESS:   52 y/o female with minimal past medical history admitted with ARDS from Influenza A. Received Tamiflu, currently enrolled in a phase 2 clinical trial for an influenza investigational agent. She was intubated for airway support and respiratory distress (ARDS) on 1/25. Gradual improvement over the following several days.   SUBJECTIVE:  Not weaning. Remains on sedation .  Lasix drip stopped 2/2 , I/O remains neg bal -1.8 L     VITAL SIGNS: BP (!) 155/94   Pulse (!) 110   Temp 99.2 F (37.3 C) (Rectal)   Resp (!) 23   Ht _0  (1.626 m)   Wt 242 lb 8.1 oz (110 kg)   LMP 08/17/2017 (Exact Date)   SpO2 97%   BMI 41.63 kg/m   HEMODYNAMICS: CVP:  [13 mmHg] 13 mmHg  VENTILATOR SETTINGS: Vent Mode: PRVC FiO2 (%):  [40 %-60 %] 45 % Set Rate:  [28 bmp] 28 bmp Vt Set:  [390 mL] 390 mL PEEP:  [10 cmH20] 10 cmH20 Plateau Pressure:  [22 cmH20-35 cmH20] 28 cmH20  INTAKE / OUTPUT: I/O last 3 completed shifts: In: 5045.2 [I.V.:835.2; Other:350; NG/GT:2610; IV Piggyback:1250] Out: 2800 [Urine:4270; Emesis/NG output:700]  PHYSICAL EXAMINATION: General: Intubated, sedated , obese  Neurological: moves eyelids in response to voice command, not follow commands HEENT: PERRL, ETT  Cardiovascular: RRR, Nl S1/S2 and -M/R/G. Pulmonary: BB crackles  GI:  BS + , TF , distended abd  Musculoskeletal: +tr to 1+ gen edema  Skin: no rash or ecchymosis observed  LABS: BMET Recent Labs  Lab 08/26/17 0523 08/27/17 0439 08/28/17 0428  NA 146* 143 141  K 3.5 3.2* 2.7*  CL 96* 94* 95*  CO2 37* 34* 35*  BUN 65* 67* 67*  CREATININE 1.12* 1.08* 0.97  GLUCOSE 145* 127* 125*   Electrolytes Recent Labs  Lab 08/25/17 0315 08/26/17 0523 08/27/17 0439  08/28/17 0428  CALCIUM 9.3 9.9 9.8 9.5  MG 2.6* 2.5* 2.6*  --   PHOS 4.3 4.9* 5.0*  --    CBC Recent Labs  Lab 08/26/17 0523 08/27/17 0439 08/28/17 0428  WBC 13.5* 11.1* 13.2*  HGB 8.1* 11.4* 8.6*  HCT 27.7* 37.5 27.9*  PLT 427* 362 441*   Coag's Recent Labs  Lab 08/22/17 1100  APTT 31  INR 1.11   Sepsis Markers Recent Labs  Lab 08/23/17 0827  PROCALCITON 0.87   ABG Recent Labs  Lab 08/25/17 0350 08/26/17 0255 08/27/17 0320  PHART 7.459* 7.445 7.419  PCO2ART 58.4* 58.9* 62.2*  PO2ART 75.6* 65.8* 81.0*   Liver Enzymes Recent Labs  Lab 08/22/17 1100 08/25/17 0315  AST 38 42*  ALT 21 30  ALKPHOS 59 75  BILITOT 0.5 0.7  ALBUMIN 1.8* 2.1*   Cardiac Enzymes No results for input(s): TROPONINI, PROBNP in the last 168 hours.  Glucose Recent Labs  Lab 08/27/17 1122 08/27/17 1556 08/27/17 1940 08/28/17 0014 08/28/17 0330 08/28/17 0740  GLUCAP 137* 126* 133* 130* 124* 114*   Imaging Dg Chest Port 1 View  Result Date: 08/28/2017 CLINICAL DATA:  Pneumonia EXAM: PORTABLE CHEST 1 VIEW COMPARISON:  08/27/2017 FINDINGS: Endotracheal tube tip measures 3.8 cm above the carina. Enteric tube tip is off the field of view but below the left hemidiaphragm. Right central venous catheter tip  over the low SVC region. No pneumothorax. Shallow inspiration with atelectasis in the lung bases. Cardiac enlargement. No pulmonary vascular congestion. Left perihilar infiltration may indicate focal pneumonia. Asymmetrical edema could also have this appearance. Suggestion of small bilateral pleural effusions. No pneumothorax. IMPRESSION: Appliances appear in satisfactory position. Cardiac enlargement. Left perihilar infiltration could represent pneumonia or asymmetrical edema. Probable small bilateral pleural effusions. Shallow inspiration with atelectasis in both lung bases. Electronically Signed   By: Lucienne Capers M.D.   On: 08/28/2017 06:32   STUDIES:  1/25 Echo: LVEF 55-60%,  Grade 2 DD, mild left atrial dilation  CULTURES: 1/26 resp > Influenza A positive 1/23 blood > NEG  1/23 RSVP > positive for FLU A H1 2009  ANTIBIOTICS: 1/26 vanc >>> 1/27 1/23 azithro >>> 1/27 1/23 ceftriaxone >>> 1/25 1/26 meropenem >>> 2/2 1/23 tamiflu >>>1/29 1/29 Linezolid >>>2/2  SIGNIFICANT EVENTS: 1/23 admission 1/25 intubation  LINES/TUBES: 1/25 ETT >>>  1/25 R IJ CVL>>>  1/25 L radial arterial line >>> 1/25  DISCUSSION: 52 y/o female with ARDS from severe community acquired pneumonia Influenza A.  Possible bacterial superinfection based on multi-lobar infiltrates. Now with worsening multi-organ failure.  Oxygenation demands are slowly decreasing . No longer experiencing O2 desats with minimal movement.  CXR stable 2/3 .   ASSESSMENT / PLAN:  PULMONARY A: Acute respiratory Failure on Vent due to severe CAP /Influenza A  ARDS  P:   Full mechanical vent support >>> ARDS protocol VAP prevention Daily WUA/SBT when stable ABG in am  Wean as tolerated  Maintain PEEP at 10 Volume overload - lasix as b/p and kidney fxn allows   CARDIOVASCULAR A:  Septic shock resolved  Hypervolemia -improved with diuresis -off lasix drip 2/2  P:  Maintain MAP > 65  Tele Monitor hemodynamics  RENAL A:    AKI> improving, urine output improved Hypokalemia  P:    Monitor BMP and UOP daily Replace electrolytes as needed  Replace K x 6 runs 2/3   GASTROINTESTINAL A:   Nutrition  Constipation >resolved, large BM 2/3 .  P:   Continue tube feeding Pantoprazole for stress ulcer prophylaxis Docusate per tube Dulcolax suppository    HEMATOLOGIC A: Anemia without acute hemorrhage, Hgb stable P: Monitor for bleeding Transfuse for Hgb < 7gm/dL  INFECTIOUS A:   Severe diffuse CAP from Influenza A ?Bacterial superinfection?  Leukocytosis- mild and slightly worse today P:   Antibiotic treatment as above D/C oseltamivir 1/29  ENDOCRINE A:   Mild hyperglycemia    P:   SSI moderate  NEUROLOGIC A: Sedation need for vent synchrony P: RASS goal: -1/-2 Continue PAD protocol Nimbex off 1/29  Versed /Fentanyl ggt for RASS goal as above- try to titrate sedation as able   FAMILY  - Updates: family updated 2/3    - Inter-disciplinary family meet or Palliative Care meeting due by:  day 7  Tammy Parrett NP-C  [facility]  [phone]    08/28/2017

## 2017-08-28 NOTE — Progress Notes (Signed)
[facility] Progress Note Patient Name: Cassie [name] DOB: 1966/04/21 MRN: [id number]   Date of Service  08/28/2017  HPI/Events of Note  K+ = 2.6 and Creatinine = 0.97.  eICU Interventions  Will replace K+.     Intervention Category Major Interventions: Electrolyte abnormality - evaluation and management  Sommer,Steven Eugene 08/28/2017, 5:53 AM

## 2017-08-29 ENCOUNTER — Inpatient Hospital Stay (HOSPITAL_COMMUNITY): Payer: Self-pay

## 2017-08-29 LAB — GLUCOSE, CAPILLARY
GLUCOSE-CAPILLARY: 110 mg/dL — AB (ref 65–99)
GLUCOSE-CAPILLARY: 115 mg/dL — AB (ref 65–99)
GLUCOSE-CAPILLARY: 126 mg/dL — AB (ref 65–99)
Glucose-Capillary: 128 mg/dL — ABNORMAL HIGH (ref 65–99)
Glucose-Capillary: 106 mg/dL — ABNORMAL HIGH (ref 65–99)
Glucose-Capillary: 110 mg/dL — ABNORMAL HIGH (ref 65–99)

## 2017-08-29 LAB — CBC
HCT: 30 % — ABNORMAL LOW (ref 36.0–46.0)
Hemoglobin: 8.9 g/dL — ABNORMAL LOW (ref 12.0–15.0)
MCH: 23.4 pg — AB (ref 26.0–34.0)
MCHC: 29.7 g/dL — AB (ref 30.0–36.0)
MCV: 78.7 fL (ref 78.0–100.0)
PLATELETS: 433 10*3/uL — AB (ref 150–400)
RBC: 3.81 MIL/uL — ABNORMAL LOW (ref 3.87–5.11)
RDW: 18.9 % — AB (ref 11.5–15.5)
WBC: 15.5 10*3/uL — ABNORMAL HIGH (ref 4.0–10.5)

## 2017-08-29 LAB — BASIC METABOLIC PANEL
Anion gap: 11 (ref 5–15)
BUN: 62 mg/dL — AB (ref 6–20)
CALCIUM: 9.8 mg/dL (ref 8.9–10.3)
CHLORIDE: 97 mmol/L — AB (ref 101–111)
CO2: 32 mmol/L (ref 22–32)
CREATININE: 0.91 mg/dL (ref 0.44–1.00)
Glucose, Bld: 135 mg/dL — ABNORMAL HIGH (ref 65–99)
Potassium: 3.2 mmol/L — ABNORMAL LOW (ref 3.5–5.1)
SODIUM: 140 mmol/L (ref 135–145)

## 2017-08-29 MED ORDER — SODIUM CHLORIDE 0.9% FLUSH: 10.0000 mL | INTRAVENOUS | Status: DC | PRN

## 2017-08-29 MED ORDER — SODIUM CHLORIDE 0.9% FLUSH
10.0000 mL | Freq: Two times a day (BID) | INTRAVENOUS | Status: DC
  Administered 2017-08-29 – 2017-08-31 (×4): 10 mL

## 2017-08-29 MED ORDER — POTASSIUM CHLORIDE 10 MEQ/50ML IV SOLN
10.0000 meq | INTRAVENOUS | Status: AC
  Administered 2017-08-29 (×4): 10 meq via INTRAVENOUS
  Filled 2017-08-29 (×4): qty 50

## 2017-08-29 MED ORDER — FUROSEMIDE 10 MG/ML IJ SOLN
40.0000 mg | Freq: Two times a day (BID) | INTRAMUSCULAR | Status: AC
  Administered 2017-08-29 (×2): 40 mg via INTRAVENOUS
  Filled 2017-08-29 (×2): qty 4

## 2017-08-29 NOTE — Progress Notes (Signed)
PULMONARY / CRITICAL CARE MEDICINE   Name: Cassie [name] MRN: [id number] DOB: 06-15-1966    ADMISSION DATE:  08/17/2017 CONSULTATION DATE:  08/19/2017  REFERRING MD:  Carolin Sicks  CHIEF COMPLAINT:  Dyspnea  HISTORY OF PRESENT ILLNESS:   52 y/o female with minimal past medical history admitted with ARDS from Influenza A. Received Tamiflu, currently enrolled in a phase 2 clinical trial for an influenza investigational agent. She was intubated for airway support and respiratory distress (ARDS) on 1/25. Gradual improvement over the following several days.   SUBJECTIVE:  Eyes open, some movement without desats  VITAL SIGNS: BP 105/64   Pulse 97   Temp 98.7 F (37.1 C) (Oral)   Resp (!) 28   Ht 5\' 4"  (1.626 m)   Wt 236 lb 15.9 oz (107.5 kg)   LMP 08/17/2017 (Exact Date)   SpO2 94%   BMI 40.68 kg/m   HEMODYNAMICS:    VENTILATOR SETTINGS: Vent Mode: PRVC FiO2 (%):  [40 %-45 %] 40 % Set Rate:  [28 bmp] 28 bmp Vt Set:  [390 mL] 390 mL PEEP:  [5 cmH20-10 cmH20] 5 cmH20 Plateau Pressure:  [20 cmH20-25 cmH20] 20 cmH20  INTAKE / OUTPUT: I/O last 3 completed shifts: In: 4070.5 [I.V.:695.5; Other:350; NG/GT:2425; IV [id number]:782] Out: 9562 [Urine:3360]  PHYSICAL EXAMINATION: General: obese female, intubated, eyes open with some movement Neurological: eyes open, does not follow commands, moves all extremities equally HEENT: PERRL, ETT  Cardiovascular: RRR, Nl S1/S2 and -M/R/G. Pulmonary: BB crackles  GI:  Non-distended, +BS Musculoskeletal: SCD's in place, +1 edema bilaterally  Skin: no rash or ecchymosis observed  LABS: BMET Recent Labs  Lab 08/27/17 0439 08/28/17 0428 08/29/17 0423  NA 143 141 140  K 3.2* 2.7* 3.2*  CL 94* 95* 97*  CO2 34* 35* 32  BUN 67* 67* 62*  CREATININE 1.08* 0.97 0.91  GLUCOSE 127* 125* 135*   Electrolytes Recent Labs  Lab 08/25/17 0315 08/26/17 0523 08/27/17 0439 08/28/17 0428 08/29/17 0423  CALCIUM 9.3 9.9 9.8 9.5 9.8  MG 2.6*  2.5* 2.6*  --   --   PHOS 4.3 4.9* 5.0*  --   --    CBC Recent Labs  Lab 08/27/17 0439 08/28/17 0428 08/29/17 0423  WBC 11.1* 13.2* 15.5*  HGB 11.4* 8.6* 8.9*  HCT 37.5 27.9* 30.0*  PLT 362 441* 433*   Coag's Recent Labs  Lab 08/22/17 1100  APTT 31  INR 1.11   Sepsis Markers Recent Labs  Lab 08/23/17 0827  PROCALCITON 0.87   ABG Recent Labs  Lab 08/25/17 0350 08/26/17 0255 08/27/17 0320  PHART 7.459* 7.445 7.419  PCO2ART 58.4* 58.9* 62.2*  PO2ART 75.6* 65.8* 81.0*   Liver Enzymes Recent Labs  Lab 08/22/17 1100 08/25/17 0315  AST 38 42*  ALT 21 30  ALKPHOS 59 75  BILITOT 0.5 0.7  ALBUMIN 1.8* 2.1*   Cardiac Enzymes No results for input(s): TROPONINI, PROBNP in the last 168 hours.  Glucose Recent Labs  Lab 08/28/17 1148 08/28/17 1537 08/28/17 2010 08/28/17 2333 08/29/17 0402 08/29/17 0734  GLUCAP 108* 128* 109* 112* 128* 110*   Imaging Dg Chest Port 1 View  Result Date: 08/29/2017 CLINICAL DATA:  Respiratory failure with shortness of breath EXAM: PORTABLE CHEST 1 VIEW COMPARISON:  Yesterday FINDINGS: Endotracheal tube tip between the clavicular heads and carina. An orogastric tube reaches the stomach. Right IJ line with tip at the SVC. History of pneumonia with left more than right airspace disease. Left diaphragm is obscured. No  edema, definite effusion, or pneumothorax. Cardiomegaly and vascular pedicle widening. IMPRESSION: 1. History of pneumonia/ARDS with unchanged left more than right airspace disease. 2. Stable low volumes are low. 3. Cardiomegaly. 4. Unremarkable hardware positioning. Electronically Signed   By: Monte Fantasia M.D.   On: 08/29/2017 06:48   STUDIES:  1/25 Echo: LVEF 55-60%, Grade 2 DD, mild left atrial dilation  CULTURES: 1/26 resp > Influenza A positive 1/23 blood > NEG  1/23 RSVP > positive for FLU A H1 2009  ANTIBIOTICS: 1/26 vanc >>> 1/27 1/23 azithro >>> 1/27 1/23 ceftriaxone >>> 1/25 1/26 meropenem >>>  2/2 1/23 tamiflu >>>1/29 1/29 Linezolid >>>2/2  SIGNIFICANT EVENTS: 1/23 admission 1/25 intubation  LINES/TUBES: 1/25 ETT >>>  1/25 R IJ CVL>>>  1/25 L radial arterial line >>> 1/25  DISCUSSION: 52 y/o female with ARDS from severe community acquired pneumonia Influenza A.  Possible bacterial superinfection based on multi-lobar infiltrates. Now with worsening multi-organ failure.  Oxygenation demands are slowly decreasing . No longer experiencing O2 desats with minimal movement.  CXR stable 2/2 .   ASSESSMENT / PLAN:  PULMONARY A: ARDS > improving with mild permissible hypercapnia due to metabolic alkalosis and diuresis  P:   Full mechanical vent support >>> ARDS protocol VAP prevention Daily WUA/SBT when stable Follow ABG Wean vent as tolerated  Maintain PEEP at 10 Volume overload - lasix as b/p and kidney fxn allows   CARDIOVASCULAR A:  Septic shock resolved BP tolerating diuresis  P:  Maintain MAP > 65 Monitor CVP during diuresis Tele Monitor hemodynamics  RENAL A:    AKI> improving, urine output improved Hypokalemia  P:    Monitor BMP and UOP daily Replace electrolytes as needed IV lasix daily Replace K   GASTROINTESTINAL A:   Nutrition  P:   Continue tube feeding Pantoprazole for stress ulcer prophylaxis Docusate per tube Dulcolax suppository    HEMATOLOGIC A: Anemia without acute hemorrhage, Hgb stable P: Monitor for bleeding Transfuse for Hgb < 7gm/dL  INFECTIOUS A:   Severe diffuse CAP from Influenza A ?Bacterial superinfection?  Leukocytosis- worsening, however afebrile P:   Monitor CBC Antibiotic treatment as above D/C oseltamivir 1/29  ENDOCRINE A:   Mild hyperglycemia   P:   SSI moderate  NEUROLOGIC A: Sedation need for vent synchrony P: RASS goal: -1/-2 Continue PAD protocol Nimbex off 1/29  Versed /Fentanyl ggt for RASS goal as above- try to wean off Versed drip   FAMILY  - Updates: No family bedside 2/4 -  Inter-disciplinary family meet or Palliative Care meeting due by:  day [place], DO PGY-2, [facility] 08/29/2017 9:01 AM

## 2017-08-29 NOTE — Progress Notes (Signed)
[facility] Progress Note Patient Name: Marjean Imperato DOB: 1966-03-19 MRN: [id number]   Date of Service  08/29/2017  HPI/Events of Note  K+ = 3.2 and Creatinine = 0.91. Patient being diuresed with Lasix.   eICU Interventions  Will replace K+.      Intervention Category Major Interventions: Electrolyte abnormality - evaluation and management  Tsugio Elison Eugene 08/29/2017, 5:52 AM

## 2017-08-30 LAB — GLUCOSE, CAPILLARY
GLUCOSE-CAPILLARY: 101 mg/dL — AB (ref 65–99)
GLUCOSE-CAPILLARY: 110 mg/dL — AB (ref 65–99)
GLUCOSE-CAPILLARY: 128 mg/dL — AB (ref 65–99)
Glucose-Capillary: 93 mg/dL (ref 65–99)
Glucose-Capillary: 102 mg/dL — ABNORMAL HIGH (ref 65–99)
Glucose-Capillary: 110 mg/dL — ABNORMAL HIGH (ref 65–99)

## 2017-08-30 LAB — BASIC METABOLIC PANEL
ANION GAP: 13 (ref 5–15)
BUN: 52 mg/dL — ABNORMAL HIGH (ref 6–20)
CHLORIDE: 96 mmol/L — AB (ref 101–111)
CO2: 30 mmol/L (ref 22–32)
Calcium: 9.4 mg/dL (ref 8.9–10.3)
Creatinine, Ser: 0.81 mg/dL (ref 0.44–1.00)
GFR calc non Af Amer: >60 mL/min (ref >60)
Glucose, Bld: 118 mg/dL — ABNORMAL HIGH (ref 65–99)
POTASSIUM: 3 mmol/L — AB (ref 3.5–5.1)
SODIUM: 139 mmol/L (ref 135–145)

## 2017-08-30 LAB — CBC
HCT: 25.7 % — ABNORMAL LOW (ref 36.0–46.0)
HEMOGLOBIN: 7.7 g/dL — AB (ref 12.0–15.0)
MCH: 23.3 pg — AB (ref 26.0–34.0)
MCHC: 30 g/dL (ref 30.0–36.0)
MCV: 77.9 fL — AB (ref 78.0–100.0)
Platelets: 390 10*3/uL (ref 150–400)
RBC: 3.3 MIL/uL — AB (ref 3.87–5.11)
RDW: 19 % — ABNORMAL HIGH (ref 11.5–15.5)
WBC: 12.3 10*3/uL — AB (ref 4.0–10.5)

## 2017-08-30 MED ORDER — FREE WATER
250.0000 mL | Freq: Four times a day (QID) | Status: DC
  Administered 2017-08-30 – 2017-09-01 (×8): 250 mL

## 2017-08-30 MED ORDER — LEVALBUTEROL HCL 1.25 MG/0.5ML IN NEBU
1.2500 mg | INHALATION_SOLUTION | Freq: Four times a day (QID) | RESPIRATORY_TRACT | Status: DC
  Administered 2017-08-30 – 2017-09-03 (×17): 1.25 mg via RESPIRATORY_TRACT
  Filled 2017-08-30 (×21): qty 0.5

## 2017-08-30 MED ORDER — POTASSIUM CHLORIDE 20 MEQ/15ML (10%) PO SOLN
30.0000 meq | ORAL | Status: AC
  Administered 2017-08-30 (×2): 30 meq
  Filled 2017-08-30 (×2): qty 30

## 2017-08-30 MED ORDER — FUROSEMIDE 10 MG/ML IJ SOLN
60.0000 mg | Freq: Two times a day (BID) | INTRAMUSCULAR | Status: DC
  Administered 2017-08-30 – 2017-09-02 (×8): 60 mg via INTRAVENOUS
  Filled 2017-08-30 (×8): qty 6

## 2017-08-30 NOTE — Research (Signed)
Title: A Randomized, Double-Blind, Placebo-Controlled Dose Ranging Study Evaluating the Safety Pharmacokinetics and Clinical Benefit of FLU-IGIV in Hospitalized Patients with Serious Influenza A infection. IA-001 (ClinicalTrials.gov Identifier: [id number], Protocol No: IA-001, [facility] Protocol #[id number])  RESEARCH SUBJECT. This research study is sponsored by [employer]   Protocol: amendment 4 -> 16 Dec 2016 ................................................................................................... Clinical Research Coordinator / Research RN note : This visit for Subject Cassie [name] with DOB: [birth year] on 08/30/2017 for the above protocol is Visit/Encounter # Day 12 and is for purpose of research . I spoke with Torrie Mayers, LAR at 11:08 and he expressed continued interest and consent in the subject continuing in the study . LAR confirmed that there was  No change in contact information (e.g. address, telephone, email). LAR thanked for participation in research and contribution to science.   In this visit 08/30/2017 the subject will be evaluated by investigator named Dr. Asencion Noble  . This research coordinator has verified that the investigator is uptodate with his training logs.  All Day 12 required procedures and assessments were completed according to the above stated protocol. The subject is on the ventilator and is alert to voice and can follow commands.  Subjects ICU nurse, Demetria, observed the subject coughing over the vent today.Subject nodded "no" to all of there flu symptoms. Refer to the subjects paper source binder for further documentation.   AE: Constipation, resolved per PCCM note on 05/feb/19 at 0731   Signed by [place] Bing, CMA, [place] Coordinator [facility]  [place], [place] 11:04 AM 08/30/2017

## 2017-08-30 NOTE — Progress Notes (Signed)
RT note- Patient has been coughing with excessive PIP's, BBS coarse expiratory wheezing, slightly difficult to suction. BBS also sounds slightly obstructive, with no quanity of secretions. RN at bedside, continued to lavage and BMV with 100%, large thick plug removed and PIP's now 28, continue to monitor.

## 2017-08-30 NOTE — Progress Notes (Signed)
Foley bag leaking urine onto floor.  Seal broken and bag changed 08/30/17 @ 0600.

## 2017-08-30 NOTE — Progress Notes (Signed)
PULMONARY / CRITICAL CARE MEDICINE   Name: Edward Trevino MRN: [id number] DOB: [birth year]    ADMISSION DATE:  08/17/2017 CONSULTATION DATE:  08/19/2017  REFERRING MD:  Carolin Sicks  CHIEF COMPLAINT:  Dyspnea  HISTORY OF PRESENT ILLNESS:   52 y/o female with minimal past medical history admitted with ARDS from Influenza A. Received Tamiflu, currently enrolled in a phase 2 clinical trial for an influenza investigational agent. She was intubated for airway support and respiratory distress (ARDS) on 1/25. Gradual improvement over the following several days.   SUBJECTIVE:  No acute events overnight. She is awake on exam trying to talk.   VITAL SIGNS: BP 131/74   Pulse 95   Temp 99 F (37.2 C) (Oral)   Resp (!) 28   Ht 5\' 4"  (1.626 m)   Wt 228 lb 13.4 oz (103.8 kg)   LMP 08/17/2017 (Exact Date)   SpO2 93%   BMI 39.28 kg/m   HEMODYNAMICS:    VENTILATOR SETTINGS: Vent Mode: PRVC FiO2 (%):  [40 %] 40 % Set Rate:  [28 bmp] 28 bmp Vt Set:  [390 mL] 390 mL PEEP:  [5 cmH20] 5 cmH20 Pressure Support:  [15 cmH20] 15 cmH20 Plateau Pressure:  [21 cmH20-23 cmH20] 21 cmH20  INTAKE / OUTPUT: I/O last 3 completed shifts: In: 3486 [I.V.:701; NG/GT:2585; IV Piggyback:200] Out: 3086 [Urine:4430; Emesis/NG output:825]  PHYSICAL EXAMINATION: General: obese female, intubated, eyes open with some movement Neurological: eyes open, moves all extremities equally HEENT: PERRL, ETT in place Cardiovascular: RRR, no MRG Pulmonary: clear in anterior lung fields GI:  Soft, Non-distended, +BS Musculoskeletal: SCD's in place, trace edema bilaterally Skin: no rash or ecchymosis observed  LABS: BMET Recent Labs  Lab 08/28/17 0428 08/29/17 0423 08/30/17 0400  NA 141 140 139  K 2.7* 3.2* 3.0*  CL 95* 97* 96*  CO2 35* 32 30  BUN 67* 62* 52*  CREATININE 0.97 0.91 0.81  GLUCOSE 125* 135* 118*   Electrolytes Recent Labs  Lab 08/25/17 0315 08/26/17 0523 08/27/17 0439 08/28/17 0428  08/29/17 0423 08/30/17 0400  CALCIUM 9.3 9.9 9.8 9.5 9.8 9.4  MG 2.6* 2.5* 2.6*  --   --   --   PHOS 4.3 4.9* 5.0*  --   --   --    CBC Recent Labs  Lab 08/28/17 0428 08/29/17 0423 08/30/17 0400  WBC 13.2* 15.5* 12.3*  HGB 8.6* 8.9* 7.7*  HCT 27.9* 30.0* 25.7*  PLT 441* 433* 390   Coag's No results for input(s): APTT, INR in the last 168 hours. Sepsis Markers Recent Labs  Lab 08/23/17 0827  PROCALCITON 0.87   ABG Recent Labs  Lab 08/25/17 0350 08/26/17 0255 08/27/17 0320  PHART 7.459* 7.445 7.419  PCO2ART 58.4* 58.9* 62.2*  PO2ART 75.6* 65.8* 81.0*   Liver Enzymes Recent Labs  Lab 08/25/17 0315  AST 42*  ALT 30  ALKPHOS 75  BILITOT 0.7  ALBUMIN 2.1*   Cardiac Enzymes No results for input(s): TROPONINI, PROBNP in the last 168 hours.  Glucose Recent Labs  Lab 08/29/17 1138 08/29/17 1536 08/29/17 2031 08/29/17 2323 08/30/17 0404 08/30/17 0728  GLUCAP 115* 106* 126* 110* 93 110*   Imaging No results found.   STUDIES:  1/25 Echo: LVEF 55-60%, Grade 2 DD, mild left atrial dilation  CULTURES: 1/26 resp > Influenza A positive 1/23 blood > NEG  1/23 RSVP > positive for FLU A H1 2009  ANTIBIOTICS: 1/26 vanc >>> 1/27 1/23 azithro >>> 1/27 1/23 ceftriaxone >>> 1/25 1/26 meropenem >>>  2/2 1/23 tamiflu >>>1/29 1/29 Linezolid >>>2/2  SIGNIFICANT EVENTS: 1/23 admission 1/25 intubation  LINES/TUBES: 1/25 ETT >>>  1/25 R IJ CVL>>> (removed) 1/25 L radial arterial line >>> 1/25 2/4 R PICC >>>  DISCUSSION: 52 y/o female with ARDS from severe community acquired pneumonia Influenza A.  Possible bacterial superinfection based on multi-lobar infiltrates. Now with worsening multi-organ failure. S/p tamiflu, meropenem, and linezolid. Oxygenation demands are slowly decreasing . No longer experiencing O2 desats with minimal movement.  CXR stable 2/2. Slowly weaning vent.  ASSESSMENT / PLAN:  PULMONARY A: ARDS secondary to influenza A - improving   P:   Wean vent as tolerated, currently PRVC, PEEP of 5 Volume overload - lasix as b/p and kidney fxn allows  Albuterol nebs TID  CARDIOVASCULAR A:  Septic shock resolved BP stable, tolerating diuresis  P:  Maintain MAP > 65 Monitor CVP during diuresis Monitor on tele Monitor hemodynamics  RENAL A:    AKI- resolved Hypokalemia  P:    Monitor BMP and UOP daily Replace electrolytes as needed IV lasix 40mg  BID   GASTROINTESTINAL A:  Constipation - resolved Nutrition  P:   Continue tube feeding Pantoprazole for stress ulcer prophylaxis Docusate per tube Dulcolax suppository    HEMATOLOGIC A: Anemia without acute hemorrhage, Hgb stable P: Monitor for bleeding Transfuse for Hgb < 7gm/dL  INFECTIOUS A:   Severe diffuse CAP from Influenza A- improving ?Bacterial superinfection? - s/p antibiotics Leukocytosis- improving, afebrile P:   Monitor CBC  ENDOCRINE A:   Mild hyperglycemia   Hypothyroid P:   SSI moderate Continue home medication ([facility])  NEUROLOGIC A: Sedation need for vent synchrony P: RASS goal: 0, -1 Continue fentanyl and versed for sedation  FAMILY  - Updates: No family bedside 2/4 - Inter-disciplinary family meet or Palliative Care meeting due by:  day Lueders, DO PGY-2, [place] [facility] 08/30/2017 8:08 AM

## 2017-08-30 NOTE — Progress Notes (Signed)
..  [facility] ADULT ICU REPLACEMENT PROTOCOL FOR AM LAB REPLACEMENT ONLY  The patient does apply for the [facility] Adult ICU Electrolyte Replacment Protocol based on the criteria listed below:   1. Is GFR >/= 40 ml/min? Yes.    Patient's GFR today is >60 2. Is urine output >/= 0.5 ml/kg/hr for the last 6 hours? Yes.   Patient's UOP is 1.21 ml/kg/hr 3. Is BUN < 60 mg/dL? Yes.    Patient's BUN today is 52 4. Abnormal electrolyte(s): K+ 3.0 5. Ordered repletion with:protocol 6. If a panic level lab has been reported, has the CCM MD in charge been notified? No.  Physician:  Dr.sommer  Carlisle Beers 08/30/2017 5:53 AM

## 2017-08-30 NOTE — Progress Notes (Signed)
Peripherally Inserted Central Catheter/Midline Placement  The IV Nurse has discussed with the patient and/or persons authorized to consent for the patient, the purpose of this procedure and the potential benefits and risks involved with this procedure.  The benefits include less needle sticks, lab draws from the catheter, and the patient may be discharged home with the catheter. Risks include, but not limited to, infection, bleeding, blood clot (thrombus formation), and puncture of an artery; nerve damage and irregular heartbeat and possibility to perform a PICC exchange if needed/ordered by physician.  Alternatives to this procedure were also discussed.  Bard Power PICC patient education guide, fact sheet on infection prevention and patient information card has been provided to patient /or left at bedside.    PICC/Midline Placement Documentation  PICC Double Lumen 64/40/34 PICC Right Basilic 41 cm 3 cm (Active)  Indication for Insertion or Continuance of Line Prolonged intravenous therapies 08/30/2017  8:00 AM  Exposed Catheter (cm) 3 cm 08/29/2017  5:00 PM  Site Assessment Clean;Dry;Intact 08/30/2017  8:00 AM  Lumen #1 Status Infusing 08/30/2017  8:00 AM  Lumen #2 Status Infusing 08/30/2017  8:00 AM  Dressing Type Transparent;Occlusive 08/30/2017  8:00 AM  Dressing Status Clean;Dry;Intact;Antimicrobial disc in place 08/30/2017  8:00 AM  Line Care Lumen 1 tubing changed;Lumen 2 tubing changed;Connections checked and tightened 08/29/2017  6:00 PM  Dressing Change Due 09/05/17 08/30/2017  8:00 AM       Scotty Court 08/30/2017, 4:56 PM

## 2017-08-31 ENCOUNTER — Inpatient Hospital Stay (HOSPITAL_COMMUNITY): Payer: Self-pay

## 2017-08-31 DIAGNOSIS — R945 Abnormal results of liver function studies: Secondary | ICD-10-CM

## 2017-08-31 LAB — BASIC METABOLIC PANEL
Anion gap: 15 (ref 5–15)
BUN: 47 mg/dL — AB (ref 6–20)
CALCIUM: 9.7 mg/dL (ref 8.9–10.3)
CO2: 30 mmol/L (ref 22–32)
CREATININE: 0.78 mg/dL (ref 0.44–1.00)
Chloride: 96 mmol/L — ABNORMAL LOW (ref 101–111)
GFR calc Af Amer: >60 mL/min (ref >60)
GLUCOSE: 114 mg/dL — AB (ref 65–99)
Potassium: 3.3 mmol/L — ABNORMAL LOW (ref 3.5–5.1)
Sodium: 141 mmol/L (ref 135–145)

## 2017-08-31 LAB — GLUCOSE, CAPILLARY
GLUCOSE-CAPILLARY: 111 mg/dL — AB (ref 65–99)
Glucose-Capillary: 105 mg/dL — ABNORMAL HIGH (ref 65–99)
Glucose-Capillary: 110 mg/dL — ABNORMAL HIGH (ref 65–99)
Glucose-Capillary: 115 mg/dL — ABNORMAL HIGH (ref 65–99)
Glucose-Capillary: 115 mg/dL — ABNORMAL HIGH (ref 65–99)
Glucose-Capillary: 175 mg/dL — ABNORMAL HIGH (ref 65–99)

## 2017-08-31 LAB — CBC
HEMATOCRIT: 29.8 % — AB (ref 36.0–46.0)
Hemoglobin: 8.8 g/dL — ABNORMAL LOW (ref 12.0–15.0)
MCH: 23.5 pg — ABNORMAL LOW (ref 26.0–34.0)
MCHC: 29.5 g/dL — ABNORMAL LOW (ref 30.0–36.0)
MCV: 79.7 fL (ref 78.0–100.0)
PLATELETS: 395 10*3/uL (ref 150–400)
RBC: 3.74 MIL/uL — ABNORMAL LOW (ref 3.87–5.11)
RDW: 19.5 % — AB (ref 11.5–15.5)
WBC: 12.1 10*3/uL — AB (ref 4.0–10.5)

## 2017-08-31 MED ORDER — CHLORHEXIDINE GLUCONATE CLOTH 2 % EX PADS
6.0000 | MEDICATED_PAD | Freq: Every day | CUTANEOUS | Status: DC
  Administered 2017-08-31 – 2017-09-05 (×6): 6 via TOPICAL

## 2017-08-31 MED ORDER — SODIUM CHLORIDE 0.9% FLUSH: 10.0000 mL | INTRAVENOUS | Status: DC | PRN

## 2017-08-31 MED ORDER — DEXMEDETOMIDINE HCL 200 MCG/2ML IV SOLN
0.0000 ug/kg/h | INTRAVENOUS | Status: DC
  Administered 2017-08-31: 0.5 ug/kg/h via INTRAVENOUS
  Administered 2017-08-31: 0.7 ug/kg/h via INTRAVENOUS
  Administered 2017-08-31: 1 ug/kg/h via INTRAVENOUS
  Filled 2017-08-31 (×3): qty 2

## 2017-08-31 MED ORDER — CHLORHEXIDINE GLUCONATE 0.12% ORAL RINSE (MEDLINE KIT): 15.0000 mL | Freq: Two times a day (BID) | OROMUCOSAL | Status: DC

## 2017-08-31 MED ORDER — SODIUM CHLORIDE 0.9% FLUSH
10.0000 mL | Freq: Two times a day (BID) | INTRAVENOUS | Status: DC
  Administered 2017-08-31: 20 mL
  Administered 2017-09-01 – 2017-09-10 (×12): 10 mL

## 2017-08-31 MED ORDER — DEXMEDETOMIDINE HCL IN NACL 400 MCG/100ML IV SOLN
0.0000 ug/kg/h | INTRAVENOUS | Status: DC
  Administered 2017-08-31: 1 ug/kg/h via INTRAVENOUS
  Administered 2017-09-01 (×2): 1.2 ug/kg/h via INTRAVENOUS
  Administered 2017-09-01 (×2): 1 ug/kg/h via INTRAVENOUS
  Administered 2017-09-02: 1.5 ug/kg/h via INTRAVENOUS
  Administered 2017-09-02 (×3): 2 ug/kg/h via INTRAVENOUS
  Administered 2017-09-02: 1.2 ug/kg/h via INTRAVENOUS
  Administered 2017-09-02: 2 ug/kg/h via INTRAVENOUS
  Administered 2017-09-02: 1.2 ug/kg/h via INTRAVENOUS
  Administered 2017-09-03: 1.8 ug/kg/h via INTRAVENOUS
  Administered 2017-09-03: 2 ug/kg/h via INTRAVENOUS
  Administered 2017-09-03: 1.795 ug/kg/h via INTRAVENOUS
  Administered 2017-09-03 (×2): 2 ug/kg/h via INTRAVENOUS
  Filled 2017-08-31 (×20): qty 100

## 2017-08-31 MED ORDER — POTASSIUM CHLORIDE 20 MEQ/15ML (10%) PO SOLN
20.0000 meq | ORAL | Status: AC
  Administered 2017-08-31 (×2): 20 meq
  Filled 2017-08-31 (×2): qty 15

## 2017-08-31 MED ORDER — ORAL CARE MOUTH RINSE
15.0000 mL | OROMUCOSAL | Status: DC
  Administered 2017-08-31 (×3): 15 mL via OROMUCOSAL

## 2017-08-31 MED ORDER — ORAL CARE MOUTH RINSE
15.0000 mL | Freq: Four times a day (QID) | OROMUCOSAL | Status: DC
Start: 2017-08-31 — End: 2017-09-01
  Administered 2017-08-31 – 2017-09-01 (×7): 15 mL via OROMUCOSAL

## 2017-08-31 NOTE — Progress Notes (Signed)
PULMONARY / CRITICAL CARE MEDICINE   Name: Lynore Coscia MRN: [id number] DOB: 05-May-1966    ADMISSION DATE:  08/17/2017 CONSULTATION DATE:  08/19/2017  REFERRING MD:  Carolin Sicks  CHIEF COMPLAINT:  Dyspnea  HISTORY OF PRESENT ILLNESS:   52 y/o female with minimal past medical history admitted with ARDS from Influenza A. Received Tamiflu, currently enrolled in a phase 2 clinical trial for an influenza investigational agent. She was intubated for airway support and respiratory distress (ARDS) on 1/25. Gradual improvement over the following several days.   SUBJECTIVE:  Awake on exam. No acute events overnight. Tolerating vent weaning well .  VITAL SIGNS: BP 116/63 (BP Location: Left Arm)   Pulse 98   Temp 99.4 F (37.4 C) (Oral)   Resp 18   Ht 5\' 4"  (1.626 m)   Wt 221 lb 1.9 oz (100.3 kg)   LMP 08/17/2017 (Exact Date)   SpO2 96%   BMI 37.96 kg/m   HEMODYNAMICS:    VENTILATOR SETTINGS: Vent Mode: PSV;CPAP FiO2 (%):  [40 %] 40 % Set Rate:  [16 bmp] 16 bmp Vt Set:  [440 mL] 440 mL PEEP:  [5 cmH20] 5 cmH20 Pressure Support:  [10 cmH20-15 cmH20] 10 cmH20 Plateau Pressure:  [17 cmH20-21 cmH20] 20 cmH20  INTAKE / OUTPUT: I/O last 3 completed shifts: In: 3732 [I.V.:742; NG/GT:2990] Out: 5555 [Urine:5555]  PHYSICAL EXAMINATION General: obese female, intubated, eyes open Neurological: eyes open, moves all extremities equally HEENT: PERRL, ETT in place Cardiovascular: RRR, no MRG, +2 radial and DP pulses bilaterally  Pulmonary: clear in anterior lung fields GI:  Soft, Non-distended, +BS Musculoskeletal: SCD's in place, trace edema bilaterally Skin: no rash or ecchymosis observed  LABS: BMET Recent Labs  Lab 08/29/17 0423 08/30/17 0400 08/31/17 0354  NA 140 139 141  K 3.2* 3.0* 3.3*  CL 97* 96* 96*  CO2 32 30 30  BUN 62* 52* 47*  CREATININE 0.91 0.81 0.78  GLUCOSE 135* 118* 114*   Electrolytes Recent Labs  Lab 08/25/17 0315 08/26/17 0523 08/27/17 0439   08/29/17 0423 08/30/17 0400 08/31/17 0354  CALCIUM 9.3 9.9 9.8   < > 9.8 9.4 9.7  MG 2.6* 2.5* 2.6*  --   --   --   --   PHOS 4.3 4.9* 5.0*  --   --   --   --    < > = values in this interval not displayed.   CBC Recent Labs  Lab 08/28/17 0428 08/29/17 0423 08/30/17 0400  WBC 13.2* 15.5* 12.3*  HGB 8.6* 8.9* 7.7*  HCT 27.9* 30.0* 25.7*  PLT 441* 433* 390   Coag's No results for input(s): APTT, INR in the last 168 hours. Sepsis Markers No results for input(s): LATICACIDVEN, PROCALCITON, O2SATVEN in the last 168 hours. ABG Recent Labs  Lab 08/25/17 0350 08/26/17 0255 08/27/17 0320  PHART 7.459* 7.445 7.419  PCO2ART 58.4* 58.9* 62.2*  PO2ART 75.6* 65.8* 81.0*   Liver Enzymes Recent Labs  Lab 08/25/17 0315  AST 42*  ALT 30  ALKPHOS 75  BILITOT 0.7  ALBUMIN 2.1*   Cardiac Enzymes No results for input(s): TROPONINI, PROBNP in the last 168 hours.  Glucose Recent Labs  Lab 08/30/17 1129 08/30/17 1526 08/30/17 1926 08/30/17 2356 08/31/17 0357 08/31/17 0730  GLUCAP 101* 102* 110* 128* 105* 110*   Imaging No results found.   STUDIES:  1/25 Echo: LVEF 55-60%, Grade 2 DD, mild left atrial dilation  CULTURES: 1/26 resp > Influenza A positive 1/23 blood > NEG  1/23 RSVP > positive for FLU A H1 2009  ANTIBIOTICS: 1/26 vanc >>> 1/27 1/23 azithro >>> 1/27 1/23 ceftriaxone >>> 1/25 1/26 meropenem >>> 2/2 1/23 tamiflu >>>1/29 1/29 Linezolid >>>2/2  SIGNIFICANT EVENTS: 1/23 admission 1/25 intubation  LINES/TUBES: 1/25 ETT >>>  1/25 R IJ CVL>>> (removed) 1/25 L radial arterial line >>> 1/25 2/4 R PICC >>>  DISCUSSION: 52 y/o female with ARDS from severe community acquired pneumonia Influenza A.  Possible bacterial superinfection based on multi-lobar infiltrates. Now with worsening multi-organ failure. S/p tamiflu, meropenem, and linezolid. Oxygenation demands are slowly decreasing . No longer experiencing O2 desats with minimal movement.  CXR stable  2/2. Slowly weaning vent.  ASSESSMENT / PLAN:  PULMONARY A: ARDS secondary to influenza A - improving  P:   Wean vent as tolerated, currently PRVC, PEEP of 5 Volume overload - lasix as b/p and kidney fxn allows  Albuterol nebs TID  CARDIOVASCULAR A:  Septic shock resolved BP stable, tolerating diuresis  P:  Maintain MAP > 65 Monitor CVP during diuresis Monitor on tele Monitor hemodynamics  RENAL A:    AKI- resolved Hypokalemia  P:    Monitor BMP and UOP daily Replace electrolytes as needed IV lasix 40mg  BID   GASTROINTESTINAL A:  Constipation - resolved Nutrition  P:   Continue tube feeding Pantoprazole for stress ulcer prophylaxis Docusate per tube Dulcolax suppository    HEMATOLOGIC A: Anemia without acute hemorrhage, Hgb stable P: Monitor for bleeding Transfuse for Hgb < 7gm/dL  INFECTIOUS A:   Severe diffuse CAP from Influenza A- improving ?Bacterial superinfection? - s/p antibiotics Leukocytosis- improving, afebrile P:   Monitor CBC  ENDOCRINE A:   Mild hyperglycemia   Hypothyroid P:   SSI moderate Continue home medication ([facility])  NEUROLOGIC A: Sedation need for vent synchrony P: RASS goal: 0, -1 Continue fentanyl and versed for sedation  FAMILY  - Updates: No family bedside 2/5, significant other updated 2/4 - Inter-disciplinary family meet or Palliative Care meeting due by:  day [place], DO PGY-2, [place] [facility] 08/31/2017 8:28 AM

## 2017-08-31 NOTE — Progress Notes (Signed)
[facility] ADULT ICU REPLACEMENT PROTOCOL FOR AM LAB REPLACEMENT ONLY  The patient does apply for the [facility] Adult ICU Electrolyte Replacment Protocol based on the criteria listed below:   1. Is GFR >/= 40 ml/min? Yes.    Patient's GFR today is >60 2. Is urine output >/= 0.5 ml/kg/hr for the last 6 hours? Yes.   Patient's UOP is 1.7 ml/kg/hr 3. Is BUN < 60 mg/dL? Yes.    Patient's BUN today is 47 4. Abnormal electrolyte(s):K3.3 5. Ordered repletion with: per protocol 6. If a panic level lab has been reported, has the CCM MD in charge been notified? Yes.  .   Physician:  Levada Schilling, MD  Vear Clock 08/31/2017 5:48 AM

## 2017-08-31 NOTE — Progress Notes (Signed)
Nutrition Follow-up  DOCUMENTATION CODES:   Obesity unspecified  INTERVENTION:   Continue:  Vital High Protein via OGT at 55 ml/h (1320 ml/day) to provide 1320 kcals, 116 gm protein, 1104 ml free water daily.  NUTRITION DIAGNOSIS:   Inadequate oral intake related to inability to eat as evidenced by NPO status.  Ongoing  GOAL:   Provide needs based on ASPEN/SCCM guidelines  Met with TF  MONITOR:   Vent status, TF tolerance, Labs  ASSESSMENT:   52 y/o female PMHx hypothyroidism. Presented w/ SOB/Cough x5 days as well as fever/chills. Worked up for acute respiratory failure w/ hypoxia d/t multifocal PNA and sepsis. Respiratory status continued to decline, developed ARDS and ultimately noninvasive measures failed and was intubated 1/25. Influenza A+. RD consulted for TF.   Discussed patient in ICU rounds and with RN today. Hopeful for extubation tomorrow. Patient is currently receiving Vital High Protein via OGT at 55 ml/h (1320 ml/day) to provide 1320 kcals, 116 gm protein, 1104 ml free water daily.  Tolerating TF well to meet nutrition needs. Patient remains intubated on ventilator support Temp (24hrs), Avg:98.9 F (37.2 C), Min:98.4 F (36.9 C), Max:99.4 F (37.4 C)   Labs reviewed. Potassium 3.3 (L) CBG's: 105-110-111 Medications reviewed and include Dulcolax, Colace, Lasix, KCl.  Diet Order:  Diet NPO time specified  EDUCATION NEEDS:   No education needs have been identified at this time  Skin:  Skin Assessment: Reviewed RN Assessment  Last BM:  2/6  Height:   Ht Readings from Last 1 Encounters:  08/17/17 '5\' 4"'  (1.626 m)    Weight:   Wt Readings from Last 1 Encounters:  08/31/17 221 lb 1.9 oz (100.3 kg)    Ideal Body Weight:  54.54 kg  BMI:  Body mass index is 37.96 kg/m.  Estimated Nutritional Needs:   Kcal:  3606-7703  Protein:  109 gm  Fluid:  2 L   Molli Barrows, RD, LDN, [place] Pager [phone] After Hours Pager [phone]

## 2017-09-01 ENCOUNTER — Inpatient Hospital Stay (HOSPITAL_COMMUNITY): Payer: Self-pay

## 2017-09-01 LAB — BASIC METABOLIC PANEL
ANION GAP: 14 (ref 5–15)
BUN: 44 mg/dL — ABNORMAL HIGH (ref 6–20)
CHLORIDE: 100 mmol/L — AB (ref 101–111)
CO2: 29 mmol/L (ref 22–32)
CREATININE: 0.92 mg/dL (ref 0.44–1.00)
Calcium: 9.3 mg/dL (ref 8.9–10.3)
GFR calc non Af Amer: >60 mL/min (ref >60)
Glucose, Bld: 158 mg/dL — ABNORMAL HIGH (ref 65–99)
POTASSIUM: 3.6 mmol/L (ref 3.5–5.1)
SODIUM: 143 mmol/L (ref 135–145)

## 2017-09-01 LAB — CBC WITH DIFFERENTIAL/PLATELET
BASOS PCT: 0 %
Basophils Absolute: 0 10*3/uL (ref 0.0–0.1)
EOS ABS: 0 10*3/uL (ref 0.0–0.7)
Eosinophils Relative: 0 %
HCT: 31.2 % — ABNORMAL LOW (ref 36.0–46.0)
HEMOGLOBIN: 9.2 g/dL — AB (ref 12.0–15.0)
Lymphocytes Relative: 12 %
Lymphs Abs: 1.5 10*3/uL (ref 0.7–4.0)
MCH: 23.5 pg — ABNORMAL LOW (ref 26.0–34.0)
MCHC: 29.5 g/dL — AB (ref 30.0–36.0)
MCV: 79.8 fL (ref 78.0–100.0)
MONO ABS: 0.5 10*3/uL (ref 0.1–1.0)
MONOS PCT: 4 %
Neutro Abs: 10.9 10*3/uL — ABNORMAL HIGH (ref 1.7–7.7)
Neutrophils Relative %: 84 %
Platelets: 365 10*3/uL (ref 150–400)
RBC: 3.91 MIL/uL (ref 3.87–5.11)
RDW: 19.1 % — AB (ref 11.5–15.5)
WBC: 12.9 10*3/uL — ABNORMAL HIGH (ref 4.0–10.5)

## 2017-09-01 LAB — CBC
HCT: 30.7 % — ABNORMAL LOW (ref 36.0–46.0)
Hemoglobin: 9.1 g/dL — ABNORMAL LOW (ref 12.0–15.0)
MCH: 23.7 pg — ABNORMAL LOW (ref 26.0–34.0)
MCHC: 29.6 g/dL — ABNORMAL LOW (ref 30.0–36.0)
MCV: 79.9 fL (ref 78.0–100.0)
Platelets: 403 K/uL — ABNORMAL HIGH (ref 150–400)
RBC: 3.84 MIL/uL — ABNORMAL LOW (ref 3.87–5.11)
RDW: 19.3 % — ABNORMAL HIGH (ref 11.5–15.5)
WBC: 10.4 K/uL (ref 4.0–10.5)

## 2017-09-01 LAB — PROTIME-INR
INR: 1.19
Prothrombin Time: 15 seconds (ref 11.4–15.2)

## 2017-09-01 LAB — POCT I-STAT 3, ART BLOOD GAS (G3+)
ACID-BASE EXCESS: 7 mmol/L — AB (ref 0.0–2.0)
Bicarbonate: 32.4 mmol/L — ABNORMAL HIGH (ref 20.0–28.0)
O2 Saturation: 98 %
PH ART: 7.408 (ref 7.350–7.450)
PO2 ART: 99 mmHg (ref 83.0–108.0)
TCO2: 34 mmol/L — ABNORMAL HIGH (ref 22–32)
pCO2 arterial: 51.4 mmHg — ABNORMAL HIGH (ref 32.0–48.0)

## 2017-09-01 LAB — GLUCOSE, CAPILLARY
GLUCOSE-CAPILLARY: 160 mg/dL — AB (ref 65–99)
GLUCOSE-CAPILLARY: 162 mg/dL — AB (ref 65–99)
Glucose-Capillary: 137 mg/dL — ABNORMAL HIGH (ref 65–99)
Glucose-Capillary: 142 mg/dL — ABNORMAL HIGH (ref 65–99)
Glucose-Capillary: 146 mg/dL — ABNORMAL HIGH (ref 65–99)
Glucose-Capillary: 154 mg/dL — ABNORMAL HIGH (ref 65–99)

## 2017-09-01 LAB — COMPREHENSIVE METABOLIC PANEL
ALBUMIN: 2.7 g/dL — AB (ref 3.5–5.0)
ALK PHOS: 100 U/L (ref 38–126)
ALT: 46 U/L (ref 14–54)
ANION GAP: 12 (ref 5–15)
AST: 32 U/L (ref 15–41)
BILIRUBIN TOTAL: 0.7 mg/dL (ref 0.3–1.2)
BUN: 41 mg/dL — ABNORMAL HIGH (ref 6–20)
CALCIUM: 9.8 mg/dL (ref 8.9–10.3)
CO2: 29 mmol/L (ref 22–32)
Chloride: 103 mmol/L (ref 101–111)
Creatinine, Ser: 0.72 mg/dL (ref 0.44–1.00)
GFR calc Af Amer: >60 mL/min (ref >60)
GFR calc non Af Amer: >60 mL/min (ref >60)
GLUCOSE: 166 mg/dL — AB (ref 65–99)
Potassium: 3.9 mmol/L (ref 3.5–5.1)
SODIUM: 144 mmol/L (ref 135–145)
TOTAL PROTEIN: 7.1 g/dL (ref 6.5–8.1)

## 2017-09-01 LAB — APTT: APTT: 33 s (ref 24–36)

## 2017-09-01 LAB — PHOSPHORUS: Phosphorus: 3.7 mg/dL (ref 2.5–4.6)

## 2017-09-01 LAB — MAGNESIUM: Magnesium: 2.2 mg/dL (ref 1.7–2.4)

## 2017-09-01 MED ORDER — FENTANYL CITRATE (PF) 100 MCG/2ML IJ SOLN: 100.0000 ug | INTRAMUSCULAR | Status: DC | PRN

## 2017-09-01 MED ORDER — VITAL HIGH PROTEIN PO LIQD
1000.0000 mL | ORAL | Status: DC
  Administered 2017-09-01 – 2017-09-05 (×6): 1000 mL
  Filled 2017-09-01 (×4): qty 1000

## 2017-09-01 MED ORDER — ORAL CARE MOUTH RINSE
15.0000 mL | OROMUCOSAL | Status: DC
  Administered 2017-09-01 – 2017-09-02 (×10): 15 mL via OROMUCOSAL

## 2017-09-01 MED ORDER — FENTANYL 2500MCG IN NS 250ML (10MCG/ML) PREMIX INFUSION
25.0000 ug/h | INTRAVENOUS | Status: DC
  Administered 2017-09-02: 300 ug/h via INTRAVENOUS
  Administered 2017-09-02 – 2017-09-03 (×4): 250 ug/h via INTRAVENOUS
  Administered 2017-09-03: 300 ug/h via INTRAVENOUS
  Filled 2017-09-01 (×7): qty 250

## 2017-09-01 MED ORDER — POTASSIUM CHLORIDE 20 MEQ/15ML (10%) PO SOLN
20.0000 meq | ORAL | Status: AC
  Administered 2017-09-01 (×2): 20 meq
  Filled 2017-09-01 (×2): qty 15

## 2017-09-01 MED ORDER — PRO-STAT SUGAR FREE PO LIQD
30.0000 mL | Freq: Two times a day (BID) | ORAL | Status: DC
  Filled 2017-09-01: qty 30

## 2017-09-01 MED ORDER — VITAL HIGH PROTEIN PO LIQD: 1000.0000 mL | ORAL | Status: DC

## 2017-09-01 MED ORDER — FENTANYL BOLUS VIA INFUSION
50.0000 ug | INTRAVENOUS | Status: DC | PRN
  Administered 2017-09-02: 50 ug via INTRAVENOUS
  Filled 2017-09-01: qty 50

## 2017-09-01 MED ORDER — RACEPINEPHRINE HCL 2.25 % IN NEBU
INHALATION_SOLUTION | RESPIRATORY_TRACT | Status: AC
  Administered 2017-09-01: 10:00:00
  Filled 2017-09-01: qty 0.5

## 2017-09-01 MED ORDER — PANTOPRAZOLE SODIUM 40 MG PO PACK
40.0000 mg | PACK | Freq: Every day | ORAL | Status: DC
  Administered 2017-09-01 – 2017-09-05 (×5): 40 mg
  Filled 2017-09-01 (×5): qty 20

## 2017-09-01 MED ORDER — PANTOPRAZOLE SODIUM 40 MG IV SOLR: 40.0000 mg | Freq: Every day | INTRAVENOUS | Status: DC

## 2017-09-01 MED ORDER — CHLORHEXIDINE GLUCONATE 0.12% ORAL RINSE (MEDLINE KIT): 15.0000 mL | Freq: Two times a day (BID) | OROMUCOSAL | Status: DC

## 2017-09-01 MED ORDER — DEXAMETHASONE SODIUM PHOSPHATE 4 MG/ML IJ SOLN
4.0000 mg | Freq: Once | INTRAMUSCULAR | Status: AC
  Administered 2017-09-01: 4 mg via INTRAVENOUS
  Filled 2017-09-01: qty 1

## 2017-09-01 MED ORDER — ETOMIDATE 2 MG/ML IV SOLN
0.3000 mg/kg | Freq: Once | INTRAVENOUS | Status: AC
  Administered 2017-09-01: 20 mg via INTRAVENOUS

## 2017-09-01 MED ORDER — DEXAMETHASONE SODIUM PHOSPHATE 10 MG/ML IJ SOLN
4.0000 mg | Freq: Once | INTRAMUSCULAR | Status: DC
  Filled 2017-09-01: qty 0.4

## 2017-09-01 MED ORDER — CHLORHEXIDINE GLUCONATE 0.12% ORAL RINSE (MEDLINE KIT)
15.0000 mL | Freq: Two times a day (BID) | OROMUCOSAL | Status: DC
  Administered 2017-09-01 – 2017-09-05 (×9): 15 mL via OROMUCOSAL

## 2017-09-01 MED ORDER — MIDAZOLAM HCL 2 MG/2ML IJ SOLN: 2.0000 mg | Freq: Once | INTRAMUSCULAR | Status: AC

## 2017-09-01 MED ORDER — FENTANYL CITRATE (PF) 100 MCG/2ML IJ SOLN: 50.0000 ug | Freq: Once | INTRAMUSCULAR | Status: DC

## 2017-09-01 MED ORDER — LORAZEPAM 2 MG/ML IJ SOLN
1.0000 mg | Freq: Once | INTRAMUSCULAR | Status: AC
  Administered 2017-09-01: 1 mg via INTRAVENOUS

## 2017-09-01 MED ORDER — MIDAZOLAM HCL 2 MG/2ML IJ SOLN
INTRAMUSCULAR | Status: AC
  Filled 2017-09-01: qty 4

## 2017-09-01 MED ORDER — FREE WATER: 250.0000 mL | Freq: Three times a day (TID) | Status: DC

## 2017-09-01 MED ORDER — RACEPINEPHRINE HCL 2.25 % IN NEBU
INHALATION_SOLUTION | RESPIRATORY_TRACT | Status: AC
  Administered 2017-09-01: 09:00:00
  Filled 2017-09-01: qty 0.5

## 2017-09-01 MED ORDER — SODIUM CHLORIDE 0.9 % IV SOLN
0.0000 ug/kg/h | INTRAVENOUS | Status: DC
Start: 2017-09-01 — End: 2017-09-01

## 2017-09-01 MED ORDER — LORAZEPAM 2 MG/ML IJ SOLN
INTRAMUSCULAR | Status: AC
  Filled 2017-09-01: qty 1

## 2017-09-01 NOTE — Procedures (Signed)
Extubation Procedure Note  Patient Details:   Name: Cassie [name] DOB: May 15, 1966 MRN: [id number]   Airway Documentation:     Evaluation  O2 sats: transiently fell during during procedure and currently acceptable Complications: Complications of stridor Patient did not tolerate procedure well. Bilateral Breath Sounds: Stridor   Yes   Pt extubated to 3L [place] per MD order, but immediately began coughing up blood and having audible stridor. Pt SPO2 dropped to 70% and pt was placed on NRB and pt suctioned after copious amounts of yellow thick sputum and moderate amount of hemoptysis were spit out. CCM MD called to bedside. Pt given 2 R-epi treatments with slight improvement. Pt very agitated and confused. Pt placed back on 5L [place] with VS within normal limits. Stridor still present but not as prominent. RT will closely monitor pt for possible re-intubation  Cassie [name], Cassie [name] 09/01/2017, 9:34 AM

## 2017-09-01 NOTE — Procedures (Signed)
Intubation Procedure Note Cassie [name] [id number] [birth year]  Procedure: Intubation Indications: Respiratory insufficiency  Procedure Details Consent: Unable to obtain consent because of emergent medical necessity. Time Out: Verified patient identification, verified procedure, site/side was marked, verified correct patient position, special equipment/implants available, medications/allergies/relevent history reviewed, required imaging and test results available.  Performed  Maximum sterile technique was used including gloves, gown, hand hygiene and mask.  MAC and 3    Evaluation Hemodynamic Status: BP stable throughout; O2 sats: stable throughout Patient's Current Condition: stable Complications: No apparent complications Patient did tolerate procedure well. Chest X-ray ordered to verify placement.  CXR: pending.  Pt intubated using glidescope #3 blade with 7.5 ett secured at 23 at the lip. Positive color change on etco2, bilateral BS, direct visualization, CXR pending.   Tyler, [place] 09/01/2017

## 2017-09-01 NOTE — Progress Notes (Signed)
[facility]  This morning we extubated Ms. Cassie [name].  She had a cuff leak and was passing a spontaneous breathing trial prior.  Immediately following extubation she coughed up bright red blood and had stridor.  We gave her racemic epinephrine nebulized x2 and some ativan.  She was able to breathe comfortably for about 45 minutes on nasal cannula, but the stridor increased so I re-intubated her.  Some edema/inflammation seen on vocal cords, no obvious injury.  Will need tracheostomy, I attempted to talk to her boyfriend Cassie [name] about this but had to leave a message.  Roselie Awkward, MD [facility] Pager: [phone] Cell: [phone] After 3pm or if no response, call [phone]

## 2017-09-01 NOTE — Progress Notes (Signed)
[facility] ADULT ICU REPLACEMENT PROTOCOL FOR AM LAB REPLACEMENT ONLY  The patient does apply for the [facility] Adult ICU Electrolyte Replacment Protocol based on the criteria listed below:   1. Is GFR >/= 40 ml/min? Yes.    Patient's GFR today is >60 2. Is urine output >/= 0.5 ml/kg/hr for the last 6 hours? Yes.   Patient's UOP is 1.30 ml/kg/hr 3. Is BUN < 60 mg/dL? Yes.    Patient's BUN today is 44 4. Abnormal electrolyte(s): Potassium 3.6 5. Ordered repletion with: potassium per protocol 6. If a panic level lab has been reported, has the CCM MD in charge been notified? No..   Physician:    Adam Phenix 09/01/2017 6:04 AM

## 2017-09-01 NOTE — Progress Notes (Signed)
PULMONARY / CRITICAL CARE MEDICINE   Name: Cassie [name] MRN: [id number] DOB: [birth year]    ADMISSION DATE:  08/17/2017 CONSULTATION DATE:  08/19/2017  REFERRING MD:  Carolin Sicks  CHIEF COMPLAINT:  Dyspnea  HISTORY OF PRESENT ILLNESS:   52 y/o female with minimal past medical history admitted with ARDS from Influenza A. Received Tamiflu, currently enrolled in a phase 2 clinical trial for an influenza investigational agent. She was intubated for airway support and respiratory distress (ARDS) on 1/25. Gradual improvement over the following several days.   SUBJECTIVE:  Agitated overnight and febrile. Restraints applied.   VITAL SIGNS: BP (!) 142/70   Pulse 79   Temp (!) 103.2 F (39.6 C) (Oral) Comment: notified RN Haleigh  Resp 18   Ht 5\' 4"  (1.626 m)   Wt 220 lb 0.3 oz (99.8 kg)   LMP 08/17/2017 (Exact Date)   SpO2 96%   BMI 37.77 kg/m   HEMODYNAMICS:    VENTILATOR SETTINGS: Vent Mode: PRVC FiO2 (%):  [40 %] 40 % Set Rate:  [16 bmp] 16 bmp Vt Set:  [440 mL] 440 mL PEEP:  [5 cmH20] 5 cmH20 Pressure Support:  [10 cmH20] 10 cmH20 Plateau Pressure:  [21 cmH20-24 cmH20] 24 cmH20  INTAKE / OUTPUT: I/O last 3 completed shifts: In: 4330.4 [I.V.:975.4; NG/GT:3355] Out: 6948 [Urine:4295]  PHYSICAL EXAMINATION General: obese female, intubated, eyes open Neurological: eyes open, moves all extremities equally HEENT: PERRL, ETT in place Cardiovascular: RRR, no MRG, +2 radial and DP pulses bilaterally  Pulmonary: clear in anterior lung fields, breath sounds diminished bilaterally  GI:  Soft, Non-distended, +BS Musculoskeletal: SCD's in place, trace edema bilaterally Skin: no rash or ecchymosis observed  LABS: BMET Recent Labs  Lab 08/30/17 0400 08/31/17 0354 09/01/17 0457  NA 139 141 143  K 3.0* 3.3* 3.6  CL 96* 96* 100*  CO2 30 30 29   BUN 52* 47* 44*  CREATININE 0.81 0.78 0.92  GLUCOSE 118* 114* 158*   Electrolytes Recent Labs  Lab 08/26/17 0523  08/27/17 0439  08/30/17 0400 08/31/17 0354 09/01/17 0457  CALCIUM 9.9 9.8   < > 9.4 9.7 9.3  MG 2.5* 2.6*  --   --   --   --   PHOS 4.9* 5.0*  --   --   --   --    < > = values in this interval not displayed.   CBC Recent Labs  Lab 08/30/17 0400 08/31/17 0739 09/01/17 0457  WBC 12.3* 12.1* 10.4  HGB 7.7* 8.8* 9.1*  HCT 25.7* 29.8* 30.7*  PLT 390 395 403*   Coag's No results for input(s): APTT, INR in the last 168 hours. Sepsis Markers No results for input(s): LATICACIDVEN, PROCALCITON, O2SATVEN in the last 168 hours. ABG Recent Labs  Lab 08/26/17 0255 08/27/17 0320  PHART 7.445 7.419  PCO2ART 58.9* 62.2*  PO2ART 65.8* 81.0*   Liver Enzymes No results for input(s): AST, ALT, ALKPHOS, BILITOT, ALBUMIN in the last 168 hours. Cardiac Enzymes No results for input(s): TROPONINI, PROBNP in the last 168 hours.  Glucose Recent Labs  Lab 08/31/17 0730 08/31/17 1112 08/31/17 1547 08/31/17 1916 08/31/17 2352 09/01/17 0333  GLUCAP 110* 111* 115* 115* 175* 142*   Imaging Dg Chest Port 1 View  Result Date: 09/01/2017 CLINICAL DATA:  Ventilator dependence EXAM: PORTABLE CHEST 1 VIEW COMPARISON:  08/31/2017 FINDINGS: Endotracheal tube in good position. Central venous catheter tip at the cavoatrial junction unchanged. No pneumothorax. Cardiac enlargement. Bibasilar airspace disease left greater than right with  mild interval improvement. Vascular congestion appears improved. NG tube enters the stomach. IMPRESSION: Endotracheal tube in satisfactory position. Bilateral airspace disease left greater than right with mild interval improvement. Electronically Signed   By: Franchot Gallo M.D.   On: 09/01/2017 07:03     STUDIES:  1/25 Echo: LVEF 55-60%, Grade 2 DD, mild left atrial dilation  CULTURES: 1/26 resp > Influenza A positive 1/23 blood > NEG  1/23 RSVP > positive for FLU A H1 2009  ANTIBIOTICS: 1/26 vanc >>> 1/27 1/23 azithro >>> 1/27 1/23 ceftriaxone >>> 1/25 1/26  meropenem >>> 2/2 1/23 tamiflu >>>1/29 1/29 Linezolid >>>2/2  SIGNIFICANT EVENTS: 1/23 admission 1/25 intubation  LINES/TUBES: 1/25 ETT >>>  1/25 R IJ CVL>>> (removed) 1/25 L radial arterial line >>> 1/25 2/4 R PICC >>>  DISCUSSION: 52 y/o female with ARDS from severe community acquired pneumonia Influenza A.  Possible bacterial superinfection based on multi-lobar infiltrates. Now with worsening multi-organ failure. S/p tamiflu, meropenem, and linezolid. Oxygenation demands are slowly decreasing . No longer experiencing O2 desats with minimal movement.  CXR stable 2/2. Slowly weaning vent.  ASSESSMENT / PLAN:  PULMONARY A: ARDS secondary to influenza A - improving  P:   Wean vent as tolerated, currently PRVC, PEEP of 5 Volume overload - lasix as b/p and kidney fxn allows  Albuterol nebs TID  CARDIOVASCULAR A:  Septic shock resolved BP stable, tolerating diuresis  P:  Maintain MAP > 65 Monitor CVP during diuresis Monitor on tele Monitor hemodynamics  RENAL A:    AKI- resolved Hypokalemia  P:    Monitor BMP and UOP daily Replace electrolytes as needed IV lasix 40mg  BID   GASTROINTESTINAL A:  Constipation - resolved Nutrition  P:   Continue tube feeding Pantoprazole for stress ulcer prophylaxis Docusate per tube Dulcolax suppository    HEMATOLOGIC A: Anemia without acute hemorrhage, Hgb stable P: Monitor for bleeding Transfuse for Hgb < 7gm/dL  INFECTIOUS A:   Severe diffuse CAP from Influenza A- improving ?Bacterial superinfection? - s/p antibiotics Leukocytosis- improving, afebrile P:   Monitor CBC  ENDOCRINE A:   Mild hyperglycemia   Hypothyroid P:   SSI moderate Continue home medication ([facility])  NEUROLOGIC A: Sedation need for vent synchrony P: RASS goal: 0, -1 Continue fentanyl and precedex for sedation  FAMILY  - Updates: No family bedside 2/6, significant other updated 2/4 - Inter-disciplinary family meet or Palliative Care  meeting due by:  day [place], DO PGY-2, [facility] 09/01/2017 7:35 AM

## 2017-09-01 NOTE — Procedures (Signed)
Intubation Procedure Note Cesia Orf [id number] 06-Jul-1966  Procedure: Intubation Indications: Airway protection and maintenance  Procedure Details Consent: Unable to obtain consent because of emergent medical necessity. Time Out: Verified patient identification, verified procedure, site/side was marked, verified correct patient position, special equipment/implants available, medications/allergies/relevent history reviewed, required imaging and test results available.  Performed  Drugs Etomidate 20mg , Versed 2mg , Fentanyl 141mcg IV DL x 1 with GS 3 blade Grade 1 view > beefy red, swollen larynx noted without obvious injury 7.5 ET tube passed through cords under direct visualization Placement confirmed with bilateral breath sounds, positive EtCO2 change and smoke in tube   Evaluation Hemodynamic Status: BP stable throughout; O2 sats: stable throughout Patient's Current Condition: stable Complications: No apparent complications Patient did tolerate procedure well. Chest X-ray ordered to verify placement.  CXR: pending.   Simonne Maffucci 09/01/2017

## 2017-09-01 NOTE — Research (Signed)
Title: A Randomized, Double-Blind, Placebo-Controlled Dose Ranging Study Evaluating the Safety Pharmacokinetics and Clinical Benefit of FLU-IGIV in Hospitalized Patients with Serious Influenza A infection. IA-001 (ClinicalTrials.gov Identifier: [id number], Protocol No: IA-001, [facility] Protocol #28638177)  RESEARCH SUBJECT. This research study is sponsored by [employer]   Protocol:  - amendment : #4 -> 16 Dec 2016   -  Administrative Change Memo - 03/JAN/2019  - Investigator Bronchure: 4.0 - 16 Dec 2016 - double checked to be correct  YES  Clinical Research Coordinator note : This visit for Subject Cassie [name] with DOB: 1965/11/15 on 09/01/2017 for the above protocol is Visit/Encounter # Day 76 and is for purpose of research. LAR has expressed continued interest and consent in the patient continuing as a study subject. The Subject was thanked for participation in research and contribution to science.   In this visit 09/01/2017 the subject will be evaluated by investigator named Dr. Brand Males. This research coordinator has verified that the investigator is up to date with his training logs  All Day 14 assesments were completed according to above stated protocol. The subject remains on ventilator and remains alert to Voice. Subject ICU nurse Kieth Brightly reports that she has not observed any more Cough over the vent today. Subject Did however has developed fever and currently has cooling blanket on. Please refer to paper source binder for further documentation.  AE: Agitation/ Acute Encephalopathy, Mild, overnight  Signed by  T. Early Chars BS, [place], [place] 5:00 [place] 09/01/2017

## 2017-09-01 NOTE — Progress Notes (Signed)
Patient very agitated. Maxed out on sedation. Attempting to climb out of bed and hitting at RN and NT. ELink notified and wrist restraints applied.   ELink MD also notified of patient temp 103.2 orally despite ice packs and tylenols x2.  RN will continue to monitor

## 2017-09-02 ENCOUNTER — Inpatient Hospital Stay (HOSPITAL_COMMUNITY): Payer: Self-pay

## 2017-09-02 LAB — BASIC METABOLIC PANEL
Anion gap: 15 (ref 5–15)
BUN: 42 mg/dL — ABNORMAL HIGH (ref 6–20)
CALCIUM: 9.9 mg/dL (ref 8.9–10.3)
CO2: 29 mmol/L (ref 22–32)
Chloride: 100 mmol/L — ABNORMAL LOW (ref 101–111)
Creatinine, Ser: 0.8 mg/dL (ref 0.44–1.00)
GFR calc Af Amer: >60 mL/min (ref >60)
GFR calc non Af Amer: >60 mL/min (ref >60)
GLUCOSE: 159 mg/dL — AB (ref 65–99)
Potassium: 3.1 mmol/L — ABNORMAL LOW (ref 3.5–5.1)
Sodium: 144 mmol/L (ref 135–145)

## 2017-09-02 LAB — CBC
HCT: 30 % — ABNORMAL LOW (ref 36.0–46.0)
Hemoglobin: 8.9 g/dL — ABNORMAL LOW (ref 12.0–15.0)
MCH: 23.6 pg — ABNORMAL LOW (ref 26.0–34.0)
MCHC: 29.7 g/dL — AB (ref 30.0–36.0)
MCV: 79.6 fL (ref 78.0–100.0)
PLATELETS: 375 10*3/uL (ref 150–400)
RBC: 3.77 MIL/uL — ABNORMAL LOW (ref 3.87–5.11)
RDW: 19.5 % — ABNORMAL HIGH (ref 11.5–15.5)
WBC: 12.7 10*3/uL — ABNORMAL HIGH (ref 4.0–10.5)

## 2017-09-02 LAB — GLUCOSE, CAPILLARY
GLUCOSE-CAPILLARY: 159 mg/dL — AB (ref 65–99)
GLUCOSE-CAPILLARY: 144 mg/dL — AB (ref 65–99)
GLUCOSE-CAPILLARY: 141 mg/dL — AB (ref 65–99)
Glucose-Capillary: 142 mg/dL — ABNORMAL HIGH (ref 65–99)
Glucose-Capillary: 144 mg/dL — ABNORMAL HIGH (ref 65–99)

## 2017-09-02 LAB — PHOSPHORUS
PHOSPHORUS: 4 mg/dL (ref 2.5–4.6)
Phosphorus: 3.9 mg/dL (ref 2.5–4.6)

## 2017-09-02 LAB — PROTIME-INR
INR: 1.23
Prothrombin Time: 15.4 seconds — ABNORMAL HIGH (ref 11.4–15.2)

## 2017-09-02 LAB — MAGNESIUM
MAGNESIUM: 1.9 mg/dL (ref 1.7–2.4)
Magnesium: 2 mg/dL (ref 1.7–2.4)

## 2017-09-02 MED ORDER — ETOMIDATE 2 MG/ML IV SOLN
20.0000 mg | Freq: Once | INTRAVENOUS | Status: AC
  Administered 2017-09-02: 20 mg via INTRAVENOUS

## 2017-09-02 MED ORDER — POTASSIUM CHLORIDE 20 MEQ/15ML (10%) PO SOLN
30.0000 meq | ORAL | Status: AC
  Administered 2017-09-02 (×2): 30 meq
  Filled 2017-09-02 (×2): qty 30

## 2017-09-02 MED ORDER — FENTANYL BOLUS VIA INFUSION
100.0000 ug | Freq: Once | INTRAVENOUS | Status: AC
  Administered 2017-09-02: 100 ug via INTRAVENOUS
  Filled 2017-09-02: qty 100

## 2017-09-02 MED ORDER — MIDAZOLAM HCL 2 MG/2ML IJ SOLN
2.0000 mg | Freq: Once | INTRAMUSCULAR | Status: AC
  Administered 2017-09-02: 2 mg via INTRAVENOUS

## 2017-09-02 MED ORDER — FENTANYL BOLUS VIA INFUSION
200.0000 ug | Freq: Once | INTRAVENOUS | Status: AC
  Administered 2017-09-02: 200 ug via INTRAVENOUS
  Filled 2017-09-02: qty 200

## 2017-09-02 MED ORDER — ORAL CARE MOUTH RINSE
15.0000 mL | Freq: Four times a day (QID) | OROMUCOSAL | Status: DC
  Administered 2017-09-03 – 2017-09-08 (×20): 15 mL via OROMUCOSAL

## 2017-09-02 MED ORDER — MIDAZOLAM HCL 2 MG/2ML IJ SOLN
4.0000 mg | Freq: Once | INTRAMUSCULAR | Status: AC
  Administered 2017-09-02: 4 mg via INTRAVENOUS

## 2017-09-02 MED ORDER — MIDAZOLAM HCL 2 MG/2ML IJ SOLN
INTRAMUSCULAR | Status: AC
  Administered 2017-09-02: 4 mg via INTRAVENOUS
  Filled 2017-09-02: qty 4

## 2017-09-02 MED ORDER — FENTANYL CITRATE (PF) 100 MCG/2ML IJ SOLN
INTRAMUSCULAR | Status: AC
  Filled 2017-09-02: qty 4

## 2017-09-02 MED ORDER — MIDAZOLAM HCL 2 MG/2ML IJ SOLN
INTRAMUSCULAR | Status: AC
  Administered 2017-09-02: 2 mg via INTRAVENOUS
  Filled 2017-09-02: qty 4

## 2017-09-02 MED ORDER — VECURONIUM BROMIDE 10 MG IV SOLR
10.0000 mg | Freq: Once | INTRAVENOUS | Status: AC
  Administered 2017-09-02: 10 mg via INTRAVENOUS

## 2017-09-02 NOTE — Progress Notes (Signed)
Cortrak Tube Team Note:  Consult received to place a Cortrak feeding tube.   A 10 F Cortrak tube was placed in the left nare and secured with a nasal bridle at 86 cm. Per the Cortrak monitor reading the tube tip is post pyloric.   No x-ray is required. RN may begin using tube.   If the tube becomes dislodged please keep the tube and contact the Cortrak team at [email or web address] (password [email or web address]) for replacement.  If after hours and replacement cannot be delayed, place a NG tube and confirm placement with an abdominal x-ray.    Koleen Distance MS, RD, LDN Pager #- [phone] After Hours Pager: [phone]

## 2017-09-02 NOTE — Progress Notes (Signed)
[facility] ADULT ICU REPLACEMENT PROTOCOL FOR AM LAB REPLACEMENT ONLY  The patient does apply for the [facility] Adult ICU Electrolyte Replacment Protocol based on the criteria listed below:   1. Is GFR >/= 40 ml/min? Yes.    Patient's GFR today is >60 2. Is urine output >/= 0.5 ml/kg/hr for the last 6 hours? Yes.   Patient's UOP is 1.63 ml/kg/hr 3. Is BUN < 60 mg/dL? Yes.    Patient's BUN today is 42 4. Abnormal electrolyte(s):Potassium 3.1 5. Ordered repletion with: Potassium per protocol 6. If a panic level lab has been reported, has the CCM MD in charge been notified? No..   Physician:    Adam Phenix 09/02/2017 6:14 AM

## 2017-09-02 NOTE — Procedures (Signed)
Percutaneous Tracheostomy Placement  Consent from family.  Patient sedated, paralyzed and position.  Placed on 100% FiO2 and RR matched.  Area cleaned and draped.  Lidocaine/epi injected.  Skin incision done followed by blunt dissection.  Trachea palpated then punctured, catheter passed and visualized bronchoscopically.  Wire placed and visualized.  Catheter removed.  Airway then crushed and dilated.  Size 6 cuffed shiley trach placed and visualized bronchoscopically well above carina.  Good volume returns.  Patient tolerated the procedure well without complications.  Minimal blood loss.  CXR ordered and pending.  Wesam G. Yacoub, M.D. [place] [facility]. Pager: [phone]. After hours pager: [phone]. 

## 2017-09-02 NOTE — Procedures (Signed)
Bronchoscopy  for Percutaneous  Tracheostomy  Name: Jamice Carreno MRN: [id number] DOB: [birth year] Procedure: Bronchoscopy for Percutaneous Tracheostomy Indications: Diagnostic evaluation of the airways In conjunction with: Dr. Nelda Marseille  Procedure Details Consent: Risks of procedure as well as the alternatives and risks of each were explained to the (patient/caregiver).  Consent for procedure obtained. Time Out: Verified patient identification, verified procedure, site/side was marked, verified correct patient position, special equipment/implants available, medications/allergies/relevent history reviewed, required imaging and test results available.  Performed  In preparation for procedure, patient was given 100% FiO2 and bronchoscope lubricated. Sedation: Benzodiazepines  Airway entered and the following bronchi were examined: LLL and Bronchi.   Procedures performed: Endotracheal Tube retracted in 2 cm increments. Cannulation of airway observed. Dilation observed. Placement of trachel tube  observed . No overt complications. Bronchoscope removed.    Evaluation Hemodynamic Status: BP stable throughout; O2 sats: stable throughout Patient's Current Condition: stable Specimens:  None Complications: No apparent complications Patient did tolerate procedure well.   Richardson Landry Lakendrick Paradis ACNP [facility] PCCM Pager [phone] till 3 pm If no answer page [phone] 09/02/2017, 12:04 PM

## 2017-09-02 NOTE — Progress Notes (Signed)
PULMONARY / CRITICAL CARE MEDICINE   Name: Cassie [name] MRN: [id number] DOB: 1966-03-22    ADMISSION DATE:  08/17/2017 CONSULTATION DATE:  08/19/2017  REFERRING MD:  Carolin Sicks  CHIEF COMPLAINT:  Dyspnea  HISTORY OF PRESENT ILLNESS:   52 y/o female with minimal past medical history admitted with ARDS from Influenza A. Received Tamiflu, currently enrolled in a phase 2 clinical trial for an influenza investigational agent. She was intubated for airway support and respiratory distress (ARDS) on 1/25. Gradual improvement over the following several days. Attempted extubation on 2/7 but patient had significant stridor and respiratory distress so she was re-intubated shortly after.   SUBJECTIVE:  No acute events overnight.  VITAL SIGNS: BP (!) 162/94   Pulse 63   Temp 99.9 F (37.7 C) (Rectal)   Resp (!) 23   Ht 5\' 4"  (1.626 m)   Wt 224 lb 10.4 oz (101.9 kg)   LMP 08/17/2017 (Exact Date)   SpO2 96%   BMI 38.56 kg/m   HEMODYNAMICS:    VENTILATOR SETTINGS: Vent Mode: PRVC FiO2 (%):  [40 %] 40 % Set Rate:  [16 bmp] 16 bmp Vt Set:  [440 mL] 440 mL PEEP:  [10 cmH20] 10 cmH20 Plateau Pressure:  [16 cmH20-25 cmH20] 19 cmH20  INTAKE / OUTPUT: I/O last 3 completed shifts: In: 3807.9 [I.V.:1397.9; NG/GT:2410] Out: 4120 [Urine:4120]  PHYSICAL EXAMINATION General: obese female, intubated, eyes open Neurological: eyes open, moves all extremities equally HEENT: PERRL, ETT in place Cardiovascular: RRR, no MRG, +2 radial and DP pulses bilaterally  Pulmonary: clear in anterior lung fields, breath sounds diminished bilaterally  GI:  Soft, Non-distended, +BS Musculoskeletal: SCD's in place, trace edema bilaterally Skin: no rash or ecchymosis observed  LABS: BMET Recent Labs  Lab 09/01/17 0457 09/01/17 1327 09/02/17 0400  NA 143 144 144  K 3.6 3.9 3.1*  CL 100* 103 100*  CO2 29 29 29   BUN 44* 41* 42*  CREATININE 0.92 0.72 0.80  GLUCOSE 158* 166* 159*    Electrolytes Recent Labs  Lab 09/01/17 0457 09/01/17 1327 09/01/17 1807 09/02/17 0300 09/02/17 0400  CALCIUM 9.3 9.8  --   --  9.9  MG  --   --  2.2 2.0 1.9  PHOS  --   --  3.7 4.0 3.9   CBC Recent Labs  Lab 09/01/17 0457 09/01/17 1327 09/02/17 0400  WBC 10.4 12.9* 12.7*  HGB 9.1* 9.2* 8.9*  HCT 30.7* 31.2* 30.0*  PLT 403* 365 375   Coag's Recent Labs  Lab 09/01/17 1327  APTT 33  INR 1.19   Sepsis Markers No results for input(s): LATICACIDVEN, PROCALCITON, O2SATVEN in the last 168 hours. ABG Recent Labs  Lab 08/27/17 0320 09/01/17 1116  PHART 7.419 7.408  PCO2ART 62.2* 51.4*  PO2ART 81.0* 99.0   Liver Enzymes Recent Labs  Lab 09/01/17 1327  AST 32  ALT 46  ALKPHOS 100  BILITOT 0.7  ALBUMIN 2.7*   Cardiac Enzymes No results for input(s): TROPONINI, PROBNP in the last 168 hours.  Glucose Recent Labs  Lab 09/01/17 0731 09/01/17 1137 09/01/17 1516 09/01/17 1955 09/01/17 2352 09/02/17 0343  GLUCAP 160* 162* 146* 137* 154* 142*   Imaging Portable Chest Xray  Result Date: 09/02/2017 CLINICAL DATA:  Respiratory distress EXAM: PORTABLE CHEST 1 VIEW COMPARISON:  09/01/2017 FINDINGS: Cardiac shadow is stable. Right-sided PICC line, endotracheal tube and nasogastric catheter are again noted and stable. Bibasilar atelectatic changes are again noted and stable. Stable vascular congestion is seen. IMPRESSION: No significant  interval change from the prior exam. Electronically Signed   By: Inez Catalina M.D.   On: 09/02/2017 07:10   Dg Chest Port 1 View  Result Date: 09/01/2017 CLINICAL DATA:  Reintubated. EXAM: PORTABLE CHEST 1 VIEW COMPARISON:  09/01/2017 FINDINGS: Endotracheal tube with the tip 3.5 cm above the carina. Right-sided PICC line with the tip projecting over the SVC. Bilateral mild interstitial thickening. Bibasilar hazy airspace disease likely reflecting atelectasis. No pleural effusion or pneumothorax. Stable cardiomegaly. Nasogastric tube  coursing below the diaphragm. No acute osseous abnormality. IMPRESSION: 1. Endotracheal tube with the tip 3.5 cm above the carina. 2. Cardiomegaly with pulmonary vascular congestion. Electronically Signed   By: Kathreen Devoid   On: 09/01/2017 11:03   Dg Abd Portable 1v  Result Date: 09/01/2017 CLINICAL DATA:  Check gastric catheter placement EXAM: PORTABLE ABDOMEN - 1 VIEW COMPARISON:  08/19/2017 FINDINGS: Scattered large and small bowel gas is noted. Gastric catheter is noted within the stomach. Bibasilar infiltrates are noted left greater than right. IMPRESSION: Gastric catheter within the stomach. Electronically Signed   By: Inez Catalina M.D.   On: 09/01/2017 11:04     STUDIES:  1/25 Echo: LVEF 55-60%, Grade 2 DD, mild left atrial dilation  CULTURES: 1/26 resp > Influenza A positive 1/23 blood > NEG  1/23 RSVP > positive for FLU A H1 2009  ANTIBIOTICS: 1/26 vanc >>> 1/27 1/23 azithro >>> 1/27 1/23 ceftriaxone >>> 1/25 1/26 meropenem >>> 2/2 1/23 tamiflu >>>1/29 1/29 Linezolid >>>2/2  SIGNIFICANT EVENTS: 1/23 admission 1/25 intubation 2/7 extubated, re-intubated  LINES/TUBES: 1/25 ETT >>> 2/7 1/25 R IJ CVL>>> (removed) 1/25 L radial arterial line >>> 1/25 2/4 R PICC >>> 2/7 ETT >>  DISCUSSION: 52 y/o female with ARDS from severe community acquired pneumonia Influenza A.   ASSESSMENT / PLAN:  PULMONARY A: ARDS secondary to influenza A - improving  P:   Plan for trach today Volume overload - lasix as b/p and kidney fxn allows  Albuterol nebs TID  CARDIOVASCULAR A:  Septic shock resolved BP stable, tolerating diuresis  P:  Maintain MAP > 65 Monitor CVP during diuresis Monitor on tele Monitor hemodynamics  RENAL A:    AKI- resolved Hypokalemia  P:    Monitor BMP and UOP daily Replace electrolytes as needed IV lasix 40mg  BID   GASTROINTESTINAL A:  Constipation - resolved Nutrition  P:   Continue tube feeding Pantoprazole for stress ulcer  prophylaxis Docusate per tube Dulcolax suppository    HEMATOLOGIC A: Anemia without acute hemorrhage, Hgb stable P: Monitor for bleeding Transfuse for Hgb < 7gm/dL  INFECTIOUS A:   Severe diffuse CAP from Influenza A- improving ?Bacterial superinfection? - s/p antibiotics Leukocytosis- afebrile P:   Monitor CBC, for fevers  ENDOCRINE A:   Mild hyperglycemia   Hypothyroid P:   SSI moderate Continue home medication ([facility])  NEUROLOGIC A: Sedation need for vent synchrony P: RASS goal: 0, -1 Continue fentanyl and precedex for sedation  FAMILY  - Updates: No family bedside 2/6-2/8, attempted to contact significant other by phone 2/7 - Inter-disciplinary family meet or Palliative Care meeting due by:  day [place], DO PGY-2, [facility] 09/02/2017 7:23 AM

## 2017-09-02 NOTE — Procedures (Signed)
Bedside Tracheostomy Insertion Procedure Note   Patient Details:   Name: Cassie [name] DOB: Aug 22, 1965 MRN: [id number]  Procedure: Tracheostomy  Pre Procedure Assessment: ET Tube Size: 7.5 ET Tube secured at lip (cm): 23 Bite block in place: No Breath Sounds: Clear  Post Procedure Assessment: BP 127/77   Pulse 67   Temp 99.2 F (37.3 C) (Core)   Resp 16   Ht 5\' 4"  (1.626 m)   Wt 224 lb 10.4 oz (101.9 kg)   LMP 08/17/2017 (Exact Date)   SpO2 99%   BMI 38.56 kg/m  O2 sats: stable throughout Complications: No apparent complications Patient did tolerate procedure well Tracheostomy Brand:Shiley Tracheostomy Style:Cuffed Tracheostomy Size: 6.0 Tracheostomy Secured NOB:[id number] Tracheostomy Placement Confirmation:Trach cuff visualized and in place    Phillis Knack [facility] 09/02/2017, 12:08 PM

## 2017-09-03 DIAGNOSIS — Z930 Tracheostomy status: Secondary | ICD-10-CM

## 2017-09-03 LAB — GLUCOSE, CAPILLARY
GLUCOSE-CAPILLARY: 101 mg/dL — AB (ref 65–99)
GLUCOSE-CAPILLARY: 107 mg/dL — AB (ref 65–99)
GLUCOSE-CAPILLARY: 147 mg/dL — AB (ref 65–99)
GLUCOSE-CAPILLARY: 198 mg/dL — AB (ref 65–99)
Glucose-Capillary: 124 mg/dL — ABNORMAL HIGH (ref 65–99)
Glucose-Capillary: 145 mg/dL — ABNORMAL HIGH (ref 65–99)
Glucose-Capillary: 151 mg/dL — ABNORMAL HIGH (ref 65–99)

## 2017-09-03 LAB — BASIC METABOLIC PANEL
Anion gap: 12 (ref 5–15)
BUN: 36 mg/dL — AB (ref 6–20)
CALCIUM: 9.7 mg/dL (ref 8.9–10.3)
CO2: 30 mmol/L (ref 22–32)
CREATININE: 0.68 mg/dL (ref 0.44–1.00)
Chloride: 104 mmol/L (ref 101–111)
GFR calc Af Amer: >60 mL/min (ref >60)
GLUCOSE: 185 mg/dL — AB (ref 65–99)
POTASSIUM: 3.5 mmol/L (ref 3.5–5.1)
Sodium: 146 mmol/L — ABNORMAL HIGH (ref 135–145)

## 2017-09-03 MED ORDER — DEXMEDETOMIDINE HCL IN NACL 400 MCG/100ML IV SOLN: 0.4000 ug/kg/h | INTRAVENOUS | Status: DC

## 2017-09-03 MED ORDER — POTASSIUM CHLORIDE 20 MEQ/15ML (10%) PO SOLN
40.0000 meq | Freq: Three times a day (TID) | ORAL | Status: AC
  Administered 2017-09-03 (×2): 40 meq
  Filled 2017-09-03 (×3): qty 30

## 2017-09-03 MED ORDER — ALBUTEROL SULFATE (2.5 MG/3ML) 0.083% IN NEBU
2.5000 mg | INHALATION_SOLUTION | Freq: Three times a day (TID) | RESPIRATORY_TRACT | Status: DC
  Administered 2017-09-04 – 2017-09-10 (×16): 2.5 mg via RESPIRATORY_TRACT
  Filled 2017-09-03 (×17): qty 3

## 2017-09-03 MED ORDER — FUROSEMIDE 10 MG/ML IJ SOLN
40.0000 mg | Freq: Four times a day (QID) | INTRAMUSCULAR | Status: AC
  Administered 2017-09-03 (×3): 40 mg via INTRAVENOUS
  Filled 2017-09-03 (×4): qty 4

## 2017-09-03 MED ORDER — POTASSIUM CHLORIDE 20 MEQ/15ML (10%) PO SOLN
20.0000 meq | ORAL | Status: DC
  Administered 2017-09-03: 20 meq
  Filled 2017-09-03: qty 15

## 2017-09-03 MED ORDER — SODIUM CHLORIDE 0.9 % IV SOLN
0.0000 ug/kg/h | INTRAVENOUS | Status: DC
  Administered 2017-09-03: 1.77 ug/kg/h via INTRAVENOUS
  Filled 2017-09-03 (×2): qty 2

## 2017-09-03 NOTE — Research (Signed)
Title: A Randomized, Double-Blind, Placebo-Controlled Dose Ranging Study Evaluating the Safety Pharmacokinetics and Clinical Benefit of FLU-IGIV in Hospitalized Patients with Serious Influenza A infection. IA-001 (ClinicalTrials.gov Identifier: [id number], Protocol No: IA-001, [facility] Protocol #[id number])  RESEARCH SUBJECT. This research study is sponsored by [employer]   Protocol:  - amendment : #4 -> 16 Dec 2016   -  Administrative Change Memo - 03/JAN/2019  - Investigator Bronchure: 4.0 - 16 Dec 2016 - double checked to be correct YES  Clinical Research Coordinator note : This visit for Subject Cassie [name] with DOB: 1965/08/13 on 09/03/2017 for the above protocol is Visit/Encounter # Day 61  and is for purpose of Research. Subject nodded to express continued interest and consent in continuing as a study subject. Subject thanked for participation in research and contribution to science.   In this visit 09/03/2017 the subject will be evaluated by investigator named Dr. Brand Males . This research coordinator has verified that the investigator is up to date with his training logs  All Day 16 assesments were completed according to above stated protocol. The Subject underwent Tracheotomy [id number] and bedside nurse reports no events and patient has tolerated well. ICU Nurse Clarene Critchley reports spiking of fever in early morning hours of 102.1 but at current assesment patient fever down to 99.5. Nurse also reports lingering cough as well. Patient is Awake and Alert at time of assesment. She was able to respond to questions and commands by head nods. Please refer to paper source binder for further documentation.  Signed by  T. [place] Coordinator I [place], [place] 11:05 AM 09/03/2017

## 2017-09-03 NOTE — Progress Notes (Signed)
PULMONARY / CRITICAL CARE MEDICINE   Name: Cassie [name] MRN: [id number] DOB: 1965/09/11    ADMISSION DATE:  08/17/2017 CONSULTATION DATE:  08/19/2017  REFERRING MD:  Carolin Sicks  CHIEF COMPLAINT:  Dyspnea  HISTORY OF PRESENT ILLNESS:   52 y/o female with minimal past medical history admitted with ARDS from Influenza A. Received Tamiflu, currently enrolled in a phase 2 clinical trial for an influenza investigational agent. She was intubated for airway support and respiratory distress (ARDS) on 1/25. Gradual improvement over the following several days. Attempted extubation on 2/7 but patient had significant stridor and respiratory distress so she was re-intubated shortly after. Tracheostomy placed 2/8.  SUBJECTIVE:  No acute events overnight.  VITAL SIGNS: BP 115/79   Pulse 73   Temp (!) 100.7 F (38.2 C) (Core)   Resp 19   Ht 5\' 4"  (1.626 m)   Wt 224 lb 3.3 oz (101.7 kg)   LMP 08/17/2017 (Exact Date)   SpO2 100%   BMI 38.49 kg/m   HEMODYNAMICS:    VENTILATOR SETTINGS: Vent Mode: PRVC FiO2 (%):  [40 %-100 %] 40 % Set Rate:  [16 bmp] 16 bmp Vt Set:  [440 mL] 440 mL PEEP:  [10 cmH20] 10 cmH20 Plateau Pressure:  [15 cmH20-27 cmH20] 15 cmH20  INTAKE / OUTPUT: I/O last 3 completed shifts: In: 4103 [I.V.:2527.3; NG/GT:1575.8] Out: 5515 [Urine:5515]  PHYSICAL EXAMINATION General: obese female, eyes open Neurological: eyes open, moves all extremities equally HEENT: PERRL, trach in place Cardiovascular: RRR, no MRG, +2 radial and DP pulses bilaterally  Pulmonary: clear in anterior lung fields, breath sounds diminished bilaterally  GI:  Soft, Non-distended, +BS Musculoskeletal: SCD's in place, trace edema bilaterally Skin: no rash or ecchymosis observed  LABS: BMET Recent Labs  Lab 09/01/17 1327 09/02/17 0400 09/03/17 0437  NA 144 144 146*  K 3.9 3.1* 3.5  CL 103 100* 104  CO2 29 29 30   BUN 41* 42* 36*  CREATININE 0.72 0.80 0.68  GLUCOSE 166* 159* 185*    Electrolytes Recent Labs  Lab 09/01/17 1327 09/01/17 1807 09/02/17 0300 09/02/17 0400 09/03/17 0437  CALCIUM 9.8  --   --  9.9 9.7  MG  --  2.2 2.0 1.9  --   PHOS  --  3.7 4.0 3.9  --    CBC Recent Labs  Lab 09/01/17 0457 09/01/17 1327 09/02/17 0400  WBC 10.4 12.9* 12.7*  HGB 9.1* 9.2* 8.9*  HCT 30.7* 31.2* 30.0*  PLT 403* 365 375   Coag's Recent Labs  Lab 09/01/17 1327 09/02/17 0757  APTT 33  --   INR 1.19 1.23   Sepsis Markers No results for input(s): LATICACIDVEN, PROCALCITON, O2SATVEN in the last 168 hours. ABG Recent Labs  Lab 09/01/17 1116  PHART 7.408  PCO2ART 51.4*  PO2ART 99.0   Liver Enzymes Recent Labs  Lab 09/01/17 1327  AST 32  ALT 46  ALKPHOS 100  BILITOT 0.7  ALBUMIN 2.7*   Cardiac Enzymes No results for input(s): TROPONINI, PROBNP in the last 168 hours.  Glucose Recent Labs  Lab 09/02/17 0653 09/02/17 1118 09/02/17 1525 09/02/17 1934 09/02/17 2352 09/03/17 0325  GLUCAP 159* 144* 144* 141* 147* 198*   Imaging Dg Chest Port 1 View  Result Date: 09/02/2017 CLINICAL DATA:  Status post tracheostomy EXAM: PORTABLE CHEST 1 VIEW COMPARISON:  Chest radiograph 09/02/2017 FINDINGS: Tracheostomy tube mid trachea. Enteric tube courses inferior to the diaphragm. Central venous catheter tip projects over the superior cavoatrial junction. Monitoring leads overlie the patient.  Stable cardiomegaly. Grossly unchanged mid lower lung heterogeneous opacities. Probable small left pleural effusion. IMPRESSION: Tracheostomy tube mid trachea. Otherwise stable exam. Electronically Signed   By: Lovey Newcomer M.D.   On: 09/02/2017 12:27     STUDIES:  1/25 Echo: LVEF 55-60%, Grade 2 DD, mild left atrial dilation  CULTURES: 1/26 resp > Influenza A positive 1/23 blood > NEG  1/23 RSVP > positive for FLU A H1 2009  ANTIBIOTICS: 1/26 vanc >>> 1/27 1/23 azithro >>> 1/27 1/23 ceftriaxone >>> 1/25 1/26 meropenem >>> 2/2 1/23 tamiflu >>>1/29 1/29  Linezolid >>>2/2  SIGNIFICANT EVENTS: 1/23 admission 1/25 intubation 2/7 extubated, re-intubated 2/8 trach placed  LINES/TUBES: 1/25 ETT >>> 2/7 1/25 R IJ CVL>>> (removed) 1/25 L radial arterial line >>> 1/25 2/4 R PICC >>> 2/7 ETT >> 2/8 2/8 trach >>  DISCUSSION: 52 y/o female with ARDS from severe community acquired pneumonia Influenza A.   ASSESSMENT / PLAN:  PULMONARY A: ARDS secondary to influenza A - improving  Tracheostomy in place P:   Attempt TC today Volume overload - lasix as b/p and kidney fxn allows  Albuterol nebs TID  CARDIOVASCULAR A:  Septic shock resolved BP stable, tolerating diuresis  P:  Maintain MAP > 65 Monitor CVP during diuresis Monitor on tele Monitor hemodynamics  RENAL A:    AKI- resolved Hypokalemia  P:    Monitor BMP and UOP daily Replace electrolytes as needed IV lasix 40mg  BID   GASTROINTESTINAL A:  Constipation - resolved Nutrition  P:   Continue tube feeding Pantoprazole for stress ulcer prophylaxis Docusate per tube Dulcolax suppository    HEMATOLOGIC A: Anemia without acute hemorrhage, Hgb stable P: Monitor for bleeding Transfuse for Hgb < 7gm/dL  INFECTIOUS A:   Severe diffuse CAP from Influenza A- improving ?Bacterial superinfection? - s/p antibiotics Leukocytosis- afebrile P:   Monitor CBC, for fevers  ENDOCRINE A:   Mild hyperglycemia   Hypothyroid P:   SSI moderate Continue home medication ([facility])  NEUROLOGIC A: ?ICU delirium P: Continue precedex, fentanyl for sedation  FAMILY  - Updates: updated SO 2/8 - Inter-disciplinary family meet or Palliative Care meeting due by:  day [place], DO PGY-2, [place] [facility] 09/03/2017 7:15 AM  Attending Note:  52 year old female with flu A and ARDS that is now trached.  Weaning very well on exam.  I reviewed CXR myself, trach is in good position.  Will attempt TC today.  Continue precedex for one more day.  Hold off  antibiotics.  Active diureses today as ordered.  Replace K preemptively and will hold in the ICU.  The patient is critically ill with multiple organ systems failure and requires high complexity decision making for assessment and support, frequent evaluation and titration of therapies, application of advanced monitoring technologies and extensive interpretation of multiple databases.   Critical Care Time devoted to patient care services described in this note is  35  Minutes. This time reflects time of care of this signee Dr [name]. This critical care time does not reflect procedure time, or teaching time or supervisory time of PA/NP/Med student/Med Resident etc but could involve care discussion time.  Rush Farmer, M.D. [facility] Pulmonary/Critical Care Medicine. Pager: [phone]. After hours pager: [phone].

## 2017-09-03 NOTE — Progress Notes (Signed)
[facility] ADULT ICU REPLACEMENT PROTOCOL FOR AM LAB REPLACEMENT ONLY  The patient does apply for the [facility] Adult ICU Electrolyte Replacment Protocol based on the criteria listed below:   1. Is GFR >/= 40 ml/min? Yes.    Patient's GFR today is >60 2. Is urine output >/= 0.5 ml/kg/hr for the last 6 hours? Yes.   Patient's UOP is 1.19 ml/kg/hr 3. Is BUN < 60 mg/dL? Yes.    Patient's BUN today is 36 4. Abnormal electrolyte(s):Potassium 3.5 5. Ordered repletion with: Potassium per protocol 6. If a panic level lab has been reported, has the CCM MD in charge been notified? Yes.  .   Physician:  Lynelle Doctor, Koi Zangara P 09/03/2017 6:08 AM

## 2017-09-03 NOTE — Progress Notes (Signed)
Pt placed on 40% trach collar and tolerating well at this time.  RT will continue to monitor.

## 2017-09-04 ENCOUNTER — Inpatient Hospital Stay (HOSPITAL_COMMUNITY): Payer: Self-pay

## 2017-09-04 LAB — BASIC METABOLIC PANEL
ANION GAP: 13 (ref 5–15)
BUN: 32 mg/dL — AB (ref 6–20)
CALCIUM: 10.1 mg/dL (ref 8.9–10.3)
CO2: 29 mmol/L (ref 22–32)
Chloride: 108 mmol/L (ref 101–111)
Creatinine, Ser: 0.67 mg/dL (ref 0.44–1.00)
GFR calc Af Amer: >60 mL/min (ref >60)
GFR calc non Af Amer: >60 mL/min (ref >60)
Glucose, Bld: 142 mg/dL — ABNORMAL HIGH (ref 65–99)
POTASSIUM: 3.5 mmol/L (ref 3.5–5.1)
SODIUM: 150 mmol/L — AB (ref 135–145)

## 2017-09-04 LAB — GLUCOSE, CAPILLARY
GLUCOSE-CAPILLARY: 134 mg/dL — AB (ref 65–99)
GLUCOSE-CAPILLARY: 138 mg/dL — AB (ref 65–99)
GLUCOSE-CAPILLARY: 113 mg/dL — AB (ref 65–99)
Glucose-Capillary: 126 mg/dL — ABNORMAL HIGH (ref 65–99)
Glucose-Capillary: 134 mg/dL — ABNORMAL HIGH (ref 65–99)

## 2017-09-04 LAB — PHOSPHORUS: Phosphorus: 3.5 mg/dL (ref 2.5–4.6)

## 2017-09-04 LAB — MAGNESIUM: Magnesium: 2 mg/dL (ref 1.7–2.4)

## 2017-09-04 LAB — CBC
HEMATOCRIT: 34.4 % — AB (ref 36.0–46.0)
Hemoglobin: 10.1 g/dL — ABNORMAL LOW (ref 12.0–15.0)
MCH: 24 pg — ABNORMAL LOW (ref 26.0–34.0)
MCHC: 29.4 g/dL — AB (ref 30.0–36.0)
MCV: 81.7 fL (ref 78.0–100.0)
Platelets: 445 10*3/uL — ABNORMAL HIGH (ref 150–400)
RBC: 4.21 MIL/uL (ref 3.87–5.11)
RDW: 20.5 % — AB (ref 11.5–15.5)
WBC: 9.2 10*3/uL (ref 4.0–10.5)

## 2017-09-04 MED ORDER — FUROSEMIDE 10 MG/ML IJ SOLN: 40.0000 mg | Freq: Every day | INTRAMUSCULAR | Status: DC

## 2017-09-04 MED ORDER — FUROSEMIDE 10 MG/ML IJ SOLN
20.0000 mg | Freq: Three times a day (TID) | INTRAMUSCULAR | Status: AC
  Administered 2017-09-04 (×2): 20 mg via INTRAVENOUS
  Filled 2017-09-04 (×2): qty 2

## 2017-09-04 MED ORDER — POTASSIUM CHLORIDE 20 MEQ/15ML (10%) PO SOLN
40.0000 meq | Freq: Three times a day (TID) | ORAL | Status: AC
  Administered 2017-09-04 (×2): 40 meq
  Filled 2017-09-04 (×2): qty 30

## 2017-09-04 NOTE — Evaluation (Signed)
[facility] Speaking Valve - Evaluation Patient Details  Name: Cassie [name] MRN: [id number] Date of Birth: 1965/09/01  Today's Date: 09/04/2017 Time: 9373-4287 SLP Time Calculation (min) (ACUTE ONLY): 13 min  Past Medical History:  Past Medical History:  Diagnosis Date  . Hypothyroidism    Past Surgical History:  Past Surgical History:  Procedure Laterality Date  . DENTAL SURGERY     HPI:  52 y/o female with minimal past medical history admitted with ARDS from Influenza A. Received Tamiflu, currently enrolled in a phase 2 clinical trial for an influenza investigational agent. She was intubated for airway support and respiratory distress (ARDS) on 1/25. Gradual improvement over the following several days. Attempted extubation on 2/7 but patient had significant stridor and respiratory distress so she was re-intubated shortly after. Tracheostomy placed 2/8.   Assessment / Plan / Recommendation Clinical Impression  PMSV evaluation was completed following placement of a Shiley #6 cuffed trach on 09/02/2017 following an extended intubation.  Cuff was partially deflated when ST entered room and was completely deflated prior to attempt to place PMSV.  The patient's vitals remained stable following cuff deflation.  Finger occlusion was completed to assess for upper respiratory airflow with significant back pressure and no upper respiratory airflow detected.  Suspect possible swelling vs the need to downsize to a smaller trach.  ST will follow for PMSV therapy to trouble shoot hopeful successful PMSV use.  PMSV was left in the patient's room and should ONLY be used with ST.         Follow Up Recommendations  Other (comment)(TBD)    Frequency and Duration min 2x/week  2 weeks    PMSV Trial PMSV was placed for: (Not placed due to no upper respiratory airflow.) Able to redirect subglottic air through upper airway: No Able to Attain Phonation: No Able to Expectorate Secretions: No  attempts Level of Secretion Expectoration with PMSV: Not observed      Vent Dependency  Vent Dependent: No FiO2 (%): 28 %    Cuff Deflation Trial  GO     Shelly Flatten, MA, CCC-SLP Acute Rehab SLP [phone]  Tolerated Cuff Deflation: Yes Length of Time for Cuff Deflation Trial: 2 minutes prior to PMV attempt Behavior: Alert;Cooperative        Lamar Sprinkles 09/04/2017, 2:13 PM

## 2017-09-04 NOTE — Progress Notes (Signed)
PULMONARY / CRITICAL CARE MEDICINE   Name: Cassie [name] MRN: [id number] DOB: June 14, 1966    ADMISSION DATE:  08/17/2017 CONSULTATION DATE:  08/19/2017  REFERRING MD:  Carolin Sicks  CHIEF COMPLAINT:  Dyspnea  HISTORY OF PRESENT ILLNESS:   52 y/o female with minimal past medical history admitted with ARDS from Influenza A. Received Tamiflu, currently enrolled in a phase 2 clinical trial for an influenza investigational agent. She was intubated for airway support and respiratory distress (ARDS) on 1/25. Gradual improvement over the following several days. Attempted extubation on 2/7 but patient had significant stridor and respiratory distress so she was re-intubated shortly after. Tracheostomy placed 2/8.  SUBJECTIVE:  Tolerating trach collar.  VITAL SIGNS: BP 125/83   Pulse (!) 107   Temp 98.6 F (37 C) (Axillary)   Resp 20   Ht 5\' 4"  (1.626 m)   Wt 225 lb 15.5 oz (102.5 kg)   LMP 08/17/2017 (Exact Date)   SpO2 92%   BMI 38.79 kg/m   HEMODYNAMICS:    VENTILATOR SETTINGS: Vent Mode: CPAP;PSV FiO2 (%):  [28 %-40 %] 28 % Set Rate:  [16 bmp] 16 bmp Vt Set:  [440 mL] 440 mL PEEP:  [5 cmH20-10 cmH20] 5 cmH20 Pressure Support:  [5 cmH20] 5 cmH20 Plateau Pressure:  [22 cmH20] 22 cmH20  INTAKE / OUTPUT: I/O last 3 completed shifts: In: 3542.5 [I.V.:1947.5; NG/GT:1595] Out: 5060 [Urine:5060]  PHYSICAL EXAMINATION General: obese female, eyes open Neurological: eyes open, moves all extremities equally, follows commands HEENT: PERRL, trach in place Cardiovascular: RRR, no MRG, +2 radial and DP pulses bilaterally  Pulmonary: clear in anterior lung fields, breath sounds diminished bilaterally  GI:  Soft, Non-distended, +BS Musculoskeletal: SCD's in place, trace edema bilaterally Skin: no rash or ecchymosis observed  LABS: BMET Recent Labs  Lab 09/01/17 1327 09/02/17 0400 09/03/17 0437  NA 144 144 146*  K 3.9 3.1* 3.5  CL 103 100* 104  CO2 29 29 30   BUN 41* 42* 36*   CREATININE 0.72 0.80 0.68  GLUCOSE 166* 159* 185*   Electrolytes Recent Labs  Lab 09/01/17 1327 09/01/17 1807 09/02/17 0300 09/02/17 0400 09/03/17 0437  CALCIUM 9.8  --   --  9.9 9.7  MG  --  2.2 2.0 1.9  --   PHOS  --  3.7 4.0 3.9  --    CBC Recent Labs  Lab 09/01/17 0457 09/01/17 1327 09/02/17 0400  WBC 10.4 12.9* 12.7*  HGB 9.1* 9.2* 8.9*  HCT 30.7* 31.2* 30.0*  PLT 403* 365 375   Coag's Recent Labs  Lab 09/01/17 1327 09/02/17 0757  APTT 33  --   INR 1.19 1.23   Sepsis Markers No results for input(s): LATICACIDVEN, PROCALCITON, O2SATVEN in the last 168 hours. ABG Recent Labs  Lab 09/01/17 1116  PHART 7.408  PCO2ART 51.4*  PO2ART 99.0   Liver Enzymes Recent Labs  Lab 09/01/17 1327  AST 32  ALT 46  ALKPHOS 100  BILITOT 0.7  ALBUMIN 2.7*   Cardiac Enzymes No results for input(s): TROPONINI, PROBNP in the last 168 hours.  Glucose Recent Labs  Lab 09/03/17 0325 09/03/17 0732 09/03/17 1213 09/03/17 1635 09/03/17 2027 09/04/17 0002  GLUCAP 198* 151* 124* 101* 107* 145*   Imaging No results found.   STUDIES:  1/25 Echo: LVEF 55-60%, Grade 2 DD, mild left atrial dilation  CULTURES: 1/26 resp > Influenza A positive 1/23 blood > NEG  1/23 RSVP > positive for FLU A H1 2009  ANTIBIOTICS: 1/26 vanc >>>  1/27 1/23 azithro >>> 1/27 1/23 ceftriaxone >>> 1/25 1/26 meropenem >>> 2/2 1/23 tamiflu >>>1/29 1/29 Linezolid >>>2/2  SIGNIFICANT EVENTS: 1/23 admission 1/25 intubation 2/7 extubated, re-intubated 2/8 trach placed  LINES/TUBES: 1/25 ETT >>> 2/7 1/25 R IJ CVL>>> (removed) 1/25 L radial arterial line >>> 1/25 2/4 R PICC >>> 2/7 ETT >> 2/8 2/8 trach >>  DISCUSSION: 52 y/o female with ARDS from severe community acquired pneumonia Influenza A.   ASSESSMENT / PLAN:  PULMONARY A: ARDS secondary to influenza A - improving  Tracheostomy in place P:   Continue respiratory support Trach care Volume overload - lasix as b/p  and kidney fxn allows  Albuterol nebs TID  CARDIOVASCULAR A:  Septic shock resolved BP stable, tolerating diuresis  P:  Maintain MAP > 65 Monitor CVP during diuresis Monitor on tele Monitor hemodynamics  RENAL A:    AKI- resolved Hypokalemia  P:    Monitor BMP and UOP daily Replace electrolytes as needed IV lasix 40mg  BID   GASTROINTESTINAL A:  Constipation - resolved Nutrition  P:   Continue tube feeding Pantoprazole for stress ulcer prophylaxis Docusate per tube Dulcolax suppository    HEMATOLOGIC A: Anemia without acute hemorrhage, Hgb stable P: Monitor for bleeding Transfuse for Hgb < 7gm/dL  INFECTIOUS A:   Severe diffuse CAP from Influenza A- improving ?Bacterial superinfection? - s/p antibiotics Leukocytosis- afebrile P:   Monitor CBC, for fevers  ENDOCRINE A:   Mild hyperglycemia   Hypothyroid P:   SSI moderate Continue home medication ([facility])  NEUROLOGIC A: ?ICU delirium P: Reorient  Discontinue sedation  FAMILY  - Updates: updated SO 2/9 - Inter-disciplinary family meet or Palliative Care meeting due by:  day [place], DO PGY-2, [place] [facility] 09/04/2017 7:30 AM  Attending Note:  52 year old female with flu A and associated ARDS.  Patient was trached and is now tolerating TC well during the day.  On exam, coarse BS diffusely.  I reviewed CXR myself, ETT is in good position.  Discussed with PCCM-NP.  Will attempt TC overnight tonight.  Will ask speech for PMV and swallow evaluation.  Hold off abx for now.  Will hold of PEG placement for now.  Hold in the ICU while weaning.  The patient is critically ill with multiple organ systems failure and requires high complexity decision making for assessment and support, frequent evaluation and titration of therapies, application of advanced monitoring technologies and extensive interpretation of multiple databases.   Critical Care Time devoted to patient care services  described in this note is  35  Minutes. This time reflects time of care of this signee Dr [name]. This critical care time does not reflect procedure time, or teaching time or supervisory time of PA/NP/Med student/Med Resident etc but could involve care discussion time.  Rush Farmer, M.D. [facility] Pulmonary/Critical Care Medicine. Pager: [phone]. After hours pager: [phone].

## 2017-09-04 NOTE — Evaluation (Signed)
Clinical/Bedside Swallow Evaluation Patient Details  Name: Cassie [name] MRN: [id number] Date of Birth: July 22, 1966  Today's Date: 09/04/2017 Time: SLP Start Time (ACUTE ONLY): 1330 SLP Stop Time (ACUTE ONLY): 1345 SLP Time Calculation (min) (ACUTE ONLY): 15 min  Past Medical History:  Past Medical History:  Diagnosis Date  . Hypothyroidism    Past Surgical History:  Past Surgical History:  Procedure Laterality Date  . DENTAL SURGERY     HPI:  52 y/o female with minimal past medical history admitted with ARDS from Influenza A. Received Tamiflu, currently enrolled in a phase 2 clinical trial for an influenza investigational agent. She was intubated for airway support and respiratory distress (ARDS) on 1/25. Gradual improvement over the following several days. Attempted extubation on 2/7 but patient had significant stridor and respiratory distress so she was re-intubated shortly after. Tracheostomy placed 2/8.   Assessment / Plan / Recommendation Clinical Impression  Clinical swallowing evaluation was completed using thin liquids and pureed material.  The patient was alert and able to follow directions. She was requesting to eat/drink.  Oral mechanism exam was completed and unremarkable.  All structures and function appeared to be adequate.   Unable to assess for volitional cough as the patient has a trach.  Given PO's the patient appeared to handle them well orally and swallow trigger appeared to be timely.  Overt s/s of aspiration were not seen and material did not appear to be suctioned from the trach.  Given the patient's length of intubation and subsequent trach placement recommend that the patient remain NPO pending MBS to determine current swallowing physiology and least restrictive diet.   SLP Visit Diagnosis: Dysphagia, oropharyngeal phase (R13.12)    Aspiration Risk  Moderate aspiration risk    Diet Recommendation  NPO pending MBS.     Medication Administration: Via  alternative means    Other  Recommendations Oral Care Recommendations: Oral care QID   Follow up Recommendations Other (comment)(TBD)        Swallow Study   General Date of Onset: 08/17/17 HPI: 52 y/o female with minimal past medical history admitted with ARDS from Influenza A. Received Tamiflu, currently enrolled in a phase 2 clinical trial for an influenza investigational agent. She was intubated for airway support and respiratory distress (ARDS) on 1/25. Gradual improvement over the following several days. Attempted extubation on 2/7 but patient had significant stridor and respiratory distress so she was re-intubated shortly after. Tracheostomy placed 2/8. Type of Study: Bedside Swallow Evaluation Previous Swallow Assessment: None noted at [facility].   Diet Prior to this Study: NG Tube Temperature Spikes Noted: Yes Respiratory Status: Trach Collar History of Recent Intubation: Yes Length of Intubations (days): 15 days Date extubated: 09/02/17(when a Shiley #6 cuffed trach was placed) Behavior/Cognition: Alert;Cooperative Oral Cavity Assessment: Within Functional Limits Oral Care Completed by SLP: No Oral Cavity - Dentition: Adequate natural dentition Vision: Functional for self-feeding Self-Feeding Abilities: Needs assist Patient Positioning: Upright in bed Baseline Vocal Quality: Aphonic(due to trach) Volitional Cough: Other (Comment)(unable to assess due to trach) Volitional Swallow: Able to elicit    Oral/Motor/Sensory Function Overall Oral Motor/Sensory Function: Within functional limits   Ice Chips Ice chips: Not tested   Thin Liquid Thin Liquid: Impaired Presentation: Spoon Pharyngeal  Phase Impairments: Suspected delayed Swallow    Nectar Thick Nectar Thick Liquid: Not tested   Honey Thick Honey Thick Liquid: Not tested   Puree Puree: Impaired Presentation: Spoon Pharyngeal Phase Impairments: Suspected delayed Swallow   Solid   GO  Solid: Not tested         Shelly Flatten, [place], [place] Acute Rehab SLP [phone]  Lamar Sprinkles 09/04/2017,2:05 PM

## 2017-09-04 NOTE — Progress Notes (Signed)
Modified Barium Swallow Progress Note  Patient Details  Name: Cassie [name] MRN: [id number] Date of Birth: 23-Jan-1966  Today's Date: 09/04/2017  Modified Barium Swallow completed.  Full report located under Chart Review in the Imaging Section.  Brief recommendations include the following:  Clinical Impression  MBS was completed using thin liquids and pureed material.  The patient presented with a pharyngeal dysphagia characterized by delayed swallow initiation, decreased hyo-laryngeal excursion, decreased epiglottic movement, decrease laryngeal vestibule closure, decreased base of tongue movement and decreased pharyngeal constriction.  In addition, passive backflow through the cricopharyngeal segment to the level of the pyriform sinuses was seen given pureed material.  These deficits led to vallecular and pyriform sinus residue.  In addition,  penetration prior to the swallow to the level of the vocal cords with subsequent SILENT aspiration after the swallow was seen given spoon sips of thin liquids.  Penetration into the laryngeal vestibule after the swallow with suspected silent aspiration was seen given pureed material.  Recommend that the patient remain NPO with alternate means for medication except for ice chips fully supervised by nursing staff with aggressive oral care at least 4 times per day.  ST will follow during acute stay for swallowing therapy and readiness for PO's.  The patient will most likely need repeat instrumental exam prior to initiation of an oral diet.     Swallow Evaluation Recommendations       SLP Diet Recommendations: NPO;Ice chips PRN after oral care       Medication Administration: Via alternative means               Oral Care Recommendations: Oral care QID   Other Recommendations: Have oral suction available   Shelly Flatten, [place], [place] Acute Rehab SLP [phone]  Lamar Sprinkles 09/04/2017,3:15 PM

## 2017-09-05 ENCOUNTER — Inpatient Hospital Stay (HOSPITAL_COMMUNITY): Payer: Self-pay

## 2017-09-05 DIAGNOSIS — J9600 Acute respiratory failure, unspecified whether with hypoxia or hypercapnia: Secondary | ICD-10-CM

## 2017-09-05 LAB — MAGNESIUM: MAGNESIUM: 2.1 mg/dL (ref 1.7–2.4)

## 2017-09-05 LAB — BASIC METABOLIC PANEL
ANION GAP: 13 (ref 5–15)
BUN: 32 mg/dL — ABNORMAL HIGH (ref 6–20)
CALCIUM: 10.2 mg/dL (ref 8.9–10.3)
CO2: 27 mmol/L (ref 22–32)
CREATININE: 0.83 mg/dL (ref 0.44–1.00)
Chloride: 112 mmol/L — ABNORMAL HIGH (ref 101–111)
Glucose, Bld: 181 mg/dL — ABNORMAL HIGH (ref 65–99)
Potassium: 3.4 mmol/L — ABNORMAL LOW (ref 3.5–5.1)
SODIUM: 152 mmol/L — AB (ref 135–145)

## 2017-09-05 LAB — TSH: TSH: 0.11 u[IU]/mL — ABNORMAL LOW (ref 0.350–4.500)

## 2017-09-05 LAB — CBC
HEMATOCRIT: 34.4 % — AB (ref 36.0–46.0)
Hemoglobin: 10 g/dL — ABNORMAL LOW (ref 12.0–15.0)
MCH: 23.5 pg — ABNORMAL LOW (ref 26.0–34.0)
MCHC: 29.1 g/dL — AB (ref 30.0–36.0)
MCV: 80.8 fL (ref 78.0–100.0)
PLATELETS: 374 10*3/uL (ref 150–400)
RBC: 4.26 MIL/uL (ref 3.87–5.11)
RDW: 20.5 % — ABNORMAL HIGH (ref 11.5–15.5)
WBC: 8.3 10*3/uL (ref 4.0–10.5)

## 2017-09-05 LAB — GLUCOSE, CAPILLARY
GLUCOSE-CAPILLARY: 134 mg/dL — AB (ref 65–99)
GLUCOSE-CAPILLARY: 160 mg/dL — AB (ref 65–99)
GLUCOSE-CAPILLARY: 172 mg/dL — AB (ref 65–99)
Glucose-Capillary: 124 mg/dL — ABNORMAL HIGH (ref 65–99)
Glucose-Capillary: 166 mg/dL — ABNORMAL HIGH (ref 65–99)
Glucose-Capillary: 159 mg/dL — ABNORMAL HIGH (ref 65–99)
Glucose-Capillary: 145 mg/dL — ABNORMAL HIGH (ref 65–99)
Glucose-Capillary: 111 mg/dL — ABNORMAL HIGH (ref 65–99)

## 2017-09-05 LAB — T4, FREE: Free T4: 1.01 ng/dL (ref 0.61–1.12)

## 2017-09-05 LAB — PHOSPHORUS: PHOSPHORUS: 3.1 mg/dL (ref 2.5–4.6)

## 2017-09-05 MED ORDER — SODIUM CHLORIDE 0.9 % IV BOLUS (SEPSIS)
250.0000 mL | Freq: Once | INTRAVENOUS | Status: AC
  Administered 2017-09-05: 250 mL via INTRAVENOUS

## 2017-09-05 NOTE — Progress Notes (Addendum)
PULMONARY / CRITICAL CARE MEDICINE   Name: Cassie [name] MRN: [id number] DOB: June 24, 1966    ADMISSION DATE:  08/17/2017 CONSULTATION DATE:  08/19/2017  REFERRING MD:  Carolin Sicks  CHIEF COMPLAINT:  Dyspnea  HISTORY OF PRESENT ILLNESS:   52 y/o female with minimal past medical history admitted with ARDS from Influenza A. Received Tamiflu, currently enrolled in a phase 2 clinical trial for an influenza investigational agent. She was intubated for airway support and respiratory distress (ARDS) on 1/25. Gradual improvement over the following several days. Attempted extubation on 2/7 but patient had significant stridor and respiratory distress so she was re-intubated shortly after. Tracheostomy placed 2/8.  SUBJECTIVE:  Overnight febrile to 100.7, tachycardic, increased RR and secretions.   ROS: Denies pain, chest pain, SOB.  VITAL SIGNS: BP (!) 169/91   Pulse (!) 132   Temp (!) 100.7 F (38.2 C) (Oral)   Resp (!) 30   Ht 5\' 4"  (1.626 m)   Wt 217 lb 13 oz (98.8 kg)   LMP 08/17/2017 (Exact Date)   SpO2 97%   BMI 37.39 kg/m   HEMODYNAMICS:    VENTILATOR SETTINGS: FiO2 (%):  [28 %-40 %] 35 %  INTAKE / OUTPUT: I/O last 3 completed shifts: In: 2245 [I.V.:430; NG/GT:1815] Out: 5462 [Urine:3130]  PHYSICAL EXAMINATION General: obese female, laying in bed in NAD Neurological: eyes open, moves all extremities equally, follows commands HEENT: PERRL, trach in place Cardiovascular: tachycardic, no MRG. Pulmonary: coarse breath sounds in anterior lung fields, dyspneic  GI:  Soft, Non-distended, +BS Musculoskeletal: SCD's in place, trace edema bilaterally Skin: no rash or ecchymosis observed  LABS: BMET Recent Labs  Lab 09/03/17 0437 09/04/17 1011 09/05/17 0552  NA 146* 150* 152*  K 3.5 3.5 3.4*  CL 104 108 112*  CO2 30 29 27   BUN 36* 32* 32*  CREATININE 0.68 0.67 0.83  GLUCOSE 185* 142* 181*   Electrolytes Recent Labs  Lab 09/02/17 0400 09/03/17 0437  09/04/17 1011 09/05/17 0552  CALCIUM 9.9 9.7 10.1 10.2  MG 1.9  --  2.0 2.1  PHOS 3.9  --  3.5 3.1   CBC Recent Labs  Lab 09/02/17 0400 09/04/17 1011 09/05/17 0552  WBC 12.7* 9.2 8.3  HGB 8.9* 10.1* 10.0*  HCT 30.0* 34.4* 34.4*  PLT 375 445* PENDING   Coag's Recent Labs  Lab 09/01/17 1327 09/02/17 0757  APTT 33  --   INR 1.19 1.23   Sepsis Markers No results for input(s): LATICACIDVEN, PROCALCITON, O2SATVEN in the last 168 hours. ABG Recent Labs  Lab 09/01/17 1116  PHART 7.408  PCO2ART 51.4*  PO2ART 99.0   Liver Enzymes Recent Labs  Lab 09/01/17 1327  AST 32  ALT 46  ALKPHOS 100  BILITOT 0.7  ALBUMIN 2.7*   Cardiac Enzymes No results for input(s): TROPONINI, PROBNP in the last 168 hours.  Glucose Recent Labs  Lab 09/04/17 0800 09/04/17 1206 09/04/17 1553 09/04/17 2026 09/05/17 0022 09/05/17 0343  GLUCAP 126* 134* 138* 113* 172* 124*   Imaging Dg Swallowing Func-speech Pathology  Result Date: 09/04/2017 Objective Swallowing Evaluation: Type of Study: MBS-Modified Barium Swallow Study  Patient Details Name: Cassie [name] MRN: [id number] Date of Birth: 1966-03-16 Today's Date: 09/04/2017 Time: SLP Start Time (ACUTE ONLY): 1430 -SLP Stop Time (ACUTE ONLY): 1500 SLP Time Calculation (min) (ACUTE ONLY): 30 min Past Medical History: Past Medical History: Diagnosis Date . Hypothyroidism  Past Surgical History: Past Surgical History: Procedure Laterality Date . DENTAL SURGERY   HPI: 53 y/o female  with minimal past medical history admitted with ARDS from Influenza A. Received Tamiflu, currently enrolled in a phase 2 clinical trial for an influenza investigational agent. She was intubated for airway support and respiratory distress (ARDS) on 1/25. Gradual improvement over the following several days. Attempted extubation on 2/7 but patient had significant stridor and respiratory distress so she was re-intubated shortly after. Tracheostomy placed 2/8.  Subjective:  The patient was seen in radiology to determine current swallowing physiology.  Assessment / Plan / Recommendation CHL IP CLINICAL IMPRESSIONS 09/04/2017 Clinical Impression MBS was completed using thin liquids and pureed material.  The patient presented with a pharyngeal dysphagia characterized by delayed swallow initiation, decreased hyo-laryngeal excursion, decreased epiglottic movement, decrease laryngeal vestibule closure, decreased base of tongue movement and decreased pharyngeal constriction.  In addition, passive backflow through the cricopharyngeal segment to the level of the pyriform sinuses was seen given pureed material.  These deficits led to vallecular and pyriform sinus residue.  In addition,  penetration prior to the swallow to the level of the vocal cords with subsequent SILENT aspiration after the swallow was seen given spoon sips of thin liquids.  Penetration into the laryngeal vestibule after the swallow with suspected silent aspiration was seen given pureed material.  Recommend that the patient remain NPO with alternate means for medication except for ice chips fully supervised by nursing staff with aggressive oral care at least 4 times per day.  ST will follow during acute stay for swallowing therapy and readiness for PO's.  The patient will most likely need repeat instrumental exam prior to initiation of an oral diet.   SLP Visit Diagnosis -- Attention and concentration deficit following -- Frontal lobe and executive function deficit following -- Impact on safety and function --   CHL IP TREATMENT RECOMMENDATION 09/04/2017 Treatment Recommendations Therapy as outlined in treatment plan below   Prognosis 09/04/2017 Prognosis for Safe Diet Advancement Good Barriers to Reach Goals -- Barriers/Prognosis Comment -- CHL IP DIET RECOMMENDATION 09/04/2017 SLP Diet Recommendations NPO;Ice chips PRN after oral care Liquid Administration via -- Medication Administration Via alternative means Compensations --  Postural Changes --   CHL IP OTHER RECOMMENDATIONS 09/04/2017 Recommended Consults -- Oral Care Recommendations Oral care QID Other Recommendations Have oral suction available   CHL IP FOLLOW UP RECOMMENDATIONS 09/04/2017 Follow up Recommendations (No Data)   CHL IP FREQUENCY AND DURATION 09/04/2017 Speech Therapy Frequency (ACUTE ONLY) min 2x/week Treatment Duration 2 weeks      CHL IP ORAL PHASE 09/04/2017 Oral Phase WFL Oral - Pudding Teaspoon -- Oral - Pudding Cup -- Oral - Honey Teaspoon -- Oral - Honey Cup -- Oral - Nectar Teaspoon -- Oral - Nectar Cup -- Oral - Nectar Straw -- Oral - Thin Teaspoon -- Oral - Thin Cup -- Oral - Thin Straw -- Oral - Puree -- Oral - Mech Soft -- Oral - Regular -- Oral - Multi-Consistency -- Oral - Pill -- Oral Phase - Comment --  CHL IP PHARYNGEAL PHASE 09/04/2017 Pharyngeal Phase Impaired Pharyngeal- Pudding Teaspoon -- Pharyngeal -- Pharyngeal- Pudding Cup -- Pharyngeal -- Pharyngeal- Honey Teaspoon -- Pharyngeal -- Pharyngeal- Honey Cup -- Pharyngeal -- Pharyngeal- Nectar Teaspoon -- Pharyngeal -- Pharyngeal- Nectar Cup -- Pharyngeal -- Pharyngeal- Nectar Straw -- Pharyngeal -- Pharyngeal- Thin Teaspoon Delayed swallow initiation-pyriform sinuses;Reduced pharyngeal peristalsis;Reduced epiglottic inversion;Reduced anterior laryngeal mobility;Reduced laryngeal elevation;Reduced airway/laryngeal closure;Reduced tongue base retraction;Penetration/Aspiration before swallow;Penetration/Apiration after swallow Pharyngeal Material enters airway, passes BELOW cords without attempt by patient to eject out (silent aspiration)  Pharyngeal- Thin Cup -- Pharyngeal -- Pharyngeal- Thin Straw -- Pharyngeal -- Pharyngeal- Puree Delayed swallow initiation-vallecula;Reduced pharyngeal peristalsis;Reduced epiglottic inversion;Reduced anterior laryngeal mobility;Reduced laryngeal elevation;Reduced airway/laryngeal closure;Reduced tongue base retraction;Penetration/Apiration after swallow Pharyngeal  Material enters airway, CONTACTS cords and not ejected out Pharyngeal- Mechanical Soft -- Pharyngeal -- Pharyngeal- Regular -- Pharyngeal -- Pharyngeal- Multi-consistency -- Pharyngeal -- Pharyngeal- Pill -- Pharyngeal -- Pharyngeal Comment --  CHL IP CERVICAL ESOPHAGEAL PHASE 09/04/2017 Cervical Esophageal Phase WFL Pudding Teaspoon -- Pudding Cup -- Honey Teaspoon -- Honey Cup -- Nectar Teaspoon -- Nectar Cup -- Nectar Straw -- Thin Teaspoon -- Thin Cup -- Thin Straw -- Puree -- Mechanical Soft -- Regular -- Multi-consistency -- Pill -- Cervical Esophageal Comment -- Shelly Flatten, MA, CCC-SLP Acute Rehab SLP [phone] Lamar Sprinkles 09/04/2017, 3:15 PM                STUDIES:  1/25 Echo: LVEF 55-60%, Grade 2 DD, mild left atrial dilation  CULTURES: 1/26 resp > Influenza A positive 1/23 blood > NEG  1/23 RSVP > positive for FLU A H1 2009  ANTIBIOTICS: 1/26 vanc >>> 1/27 1/23 azithro >>> 1/27 1/23 ceftriaxone >>> 1/25 1/26 meropenem >>> 2/2 1/23 tamiflu >>>1/29 1/29 Linezolid >>>2/2  SIGNIFICANT EVENTS: 1/23 admission 1/25 intubation 2/7 extubated, re-intubated 2/8 trach placed  LINES/TUBES: 1/25 ETT >>> 2/7 1/25 R IJ CVL>>> (removed) 1/25 L radial arterial line >>> 1/25 2/4 R PICC >>> 2/7 ETT >> 2/8 2/8 trach >>  DISCUSSION: 52 y/o female with ARDS from severe community acquired pneumonia Influenza A.   ASSESSMENT / PLAN:  PULMONARY A: ARDS secondary to influenza A - improving  Tracheostomy in place P:   Continue trach collar, no vent Volume overload - lasix as b/p and kidney fxn allows  Albuterol nebs TID Obtain CXR today due to secretions, increased RR  CARDIOVASCULAR A:  Septic shock resolved BP stable, tolerating diuresis  P:  Maintain MAP > 65 Monitor CVP during diuresis Monitor on tele Monitor hemodynamics  RENAL A:    AKI- resolved Hypokalemia  P:    Monitor BMP and UOP daily Replace electrolytes as needed IV lasix 40mg  BID    GASTROINTESTINAL A:  Constipation - resolved Nutrition  P:   Continue tube feeding Pantoprazole for stress ulcer prophylaxis Docusate per tube Dulcolax suppository    HEMATOLOGIC A: Anemia without acute hemorrhage, Hgb stable P: Monitor for bleeding Transfuse for Hgb < 7gm/dL  INFECTIOUS A:   Severe diffuse CAP from Influenza A- improving ?Bacterial superinfection? - s/p antibiotics Leukocytosis- resolved P:   Monitor CBC, for fevers  ENDOCRINE A:   Mild hyperglycemia   Hypothyroid P:   SSI moderate Continue home medication ([place])  NEUROLOGIC A: ?ICU delirium- improving P: Avoid sedatives Reorient  FAMILY  - Updates: updated SO 2/8 - Inter-disciplinary family meet or Palliative Care meeting due by:  day Hurley, DO PGY-2, [facility] 09/05/2017 7:23 AM

## 2017-09-05 NOTE — Research (Signed)
Title: A Randomized, Double-Blind, Placebo-Controlled Dose Ranging Study Evaluating the Safety Pharmacokinetics and Clinical Benefit of FLU-IGIV in Hospitalized Patients with Serious Influenza A infection. IA-001 (ClinicalTrials.gov Identifier: [id number], Protocol No: IA-001, [facility] Protocol #[id number])  RESEARCH SUBJECT. This research study is sponsored by [employer]   Protocol:  - amendment : #4 -> 16 Dec 2016   -  Administrative Change Memo - 03/JAN/2019  - Investigator Bronchure: 4.0 - 16 Dec 2016 - double checked to be correct YES ................................................................................................... Clinical Research Coordinator / Research RN note : This visit for Subject Cassie [name] with DOB: 11-29-1965 on 09/05/2017 for the above protocol is Visit/Encounter # Day 7  and is for purpose of assessment .   In this visit 09/05/2017 the subject is evaluated by investigator Dr. Chase Caller. During this visit subject was alert and awake at time of assesment. She was able to follow commands and respond by blinking her eyes to my questions. Visit was conducted as per the protocol. Please refer to paper source binder for further documentation.    Signed by  [place], [place] 4:59 PM 09/05/2017

## 2017-09-05 NOTE — Progress Notes (Signed)
  Speech Language Pathology Treatment: Cassie [name] Speaking valve  Patient Details Name: Cassie [name] MRN: [id number] DOB: [birth year] Today's Date: 09/05/2017 Time: 3734-2876 SLP Time Calculation (min) (ACUTE ONLY): 11 min  Assessment / Plan / Recommendation Clinical Impression  Pt sleeping in bed after being awake/alert sitting in chair this morning. Awoke with max and needed continued verbal/tactile stimulation. Valve placed for 5-20 second intervals with evidence of back pressure due to immobilization of air and suspected edema with valve popping off trach hub. Verbal cues/direction for adequate inhalation change in position however pt unable to achieve any vocalization and mouthed words. RR 24-34, HR 111, SpO2 95%. Prognosis good for verbalization when suspected edema is decreased, smaller and/or cuffless trach and increased use with ST only presently.    HPI HPI: 52 y/o female with minimal past medical history admitted with ARDS from Influenza A. Received Tamiflu, currently enrolled in a phase 2 clinical trial for an influenza investigational agent. She was intubated for airway support and respiratory distress (ARDS) on 1/25. Gradual improvement over the following several days. Attempted extubation on 2/7 but patient had significant stridor and respiratory distress so she was re-intubated shortly after. Tracheostomy placed 2/8.      SLP Plan  Continue with current plan of care       Recommendations         Patient may use Passy-Muir Speech Valve: with SLP only PMSV Supervision: Full MD: Please consider changing trach tube to : Cuffless;Smaller size         Oral Care Recommendations: Oral care QID Follow up Recommendations: (TBD) SLP Visit Diagnosis: Aphonia (R49.1) Plan: Continue with current plan of care                      Houston Siren 09/05/2017, 3:28 PM   Orbie Pyo Colvin Caroli.Ed [employer] [phone]

## 2017-09-05 NOTE — Care Management Note (Addendum)
Case Management Note  Patient Details  Name: Cassie [name] MRN: [id number] Date of Birth: 24-Oct-1965  Subjective/Objective:      Pt admitted with ARDs secondary to flu              Action/Plan:  From home.  Still requiring ventilator    Expected Discharge Date:                  Expected Discharge Plan:  Home/Self Care  In-House Referral:     Discharge planning Services  CM Consult, Medication Assistance  Post Acute Care Choice:    Choice offered to:     DME Arranged:    DME Agency:     HH Arranged:    HH Agency:     Status of Service:  In process, will continue to follow  If discussed at Long Length of Stay Meetings, dates discussed:    Additional Comments: Pt was completely independent from home PTA.  Pt now has trach - TC over 24 hours. PT eval ordered [name], RN 09/05/2017, 11:39 AM

## 2017-09-06 DIAGNOSIS — Z930 Tracheostomy status: Secondary | ICD-10-CM

## 2017-09-06 DIAGNOSIS — R000 Tachycardia, unspecified: Secondary | ICD-10-CM

## 2017-09-06 DIAGNOSIS — E870 Hyperosmolality and hypernatremia: Secondary | ICD-10-CM | POA: Diagnosis not present

## 2017-09-06 DIAGNOSIS — R739 Hyperglycemia, unspecified: Secondary | ICD-10-CM | POA: Clinically undetermined

## 2017-09-06 DIAGNOSIS — J9601 Acute respiratory failure with hypoxia: Secondary | ICD-10-CM

## 2017-09-06 LAB — BASIC METABOLIC PANEL
ANION GAP: 16 — AB (ref 5–15)
BUN: 36 mg/dL — ABNORMAL HIGH (ref 6–20)
CALCIUM: 9.8 mg/dL (ref 8.9–10.3)
CO2: 26 mmol/L (ref 22–32)
Chloride: 112 mmol/L — ABNORMAL HIGH (ref 101–111)
Creatinine, Ser: 0.78 mg/dL (ref 0.44–1.00)
Glucose, Bld: 181 mg/dL — ABNORMAL HIGH (ref 65–99)
POTASSIUM: 3 mmol/L — AB (ref 3.5–5.1)
Sodium: 154 mmol/L — ABNORMAL HIGH (ref 135–145)

## 2017-09-06 LAB — GLUCOSE, CAPILLARY
Glucose-Capillary: 141 mg/dL — ABNORMAL HIGH (ref 65–99)
Glucose-Capillary: 161 mg/dL — ABNORMAL HIGH (ref 65–99)
Glucose-Capillary: 153 mg/dL — ABNORMAL HIGH (ref 65–99)
Glucose-Capillary: 143 mg/dL — ABNORMAL HIGH (ref 65–99)
Glucose-Capillary: 149 mg/dL — ABNORMAL HIGH (ref 65–99)
Glucose-Capillary: 152 mg/dL — ABNORMAL HIGH (ref 65–99)

## 2017-09-06 LAB — CBC
HEMATOCRIT: 36.6 % (ref 36.0–46.0)
Hemoglobin: 10.8 g/dL — ABNORMAL LOW (ref 12.0–15.0)
MCH: 23.9 pg — ABNORMAL LOW (ref 26.0–34.0)
MCHC: 29.5 g/dL — ABNORMAL LOW (ref 30.0–36.0)
MCV: 81.2 fL (ref 78.0–100.0)
PLATELETS: 410 10*3/uL — AB (ref 150–400)
RBC: 4.51 MIL/uL (ref 3.87–5.11)
RDW: 21.2 % — AB (ref 11.5–15.5)
WBC: 9.8 10*3/uL (ref 4.0–10.5)

## 2017-09-06 LAB — HEMOGLOBIN A1C
Hgb A1c MFr Bld: 5.9 % — ABNORMAL HIGH (ref 4.8–5.6)
Mean Plasma Glucose: 122.63 mg/dL

## 2017-09-06 MED ORDER — JEVITY 1.5 CAL/FIBER PO LIQD
1000.0000 mL | ORAL | Status: DC
  Filled 2017-09-06: qty 1000

## 2017-09-06 MED ORDER — POTASSIUM CHLORIDE 20 MEQ/15ML (10%) PO SOLN
40.0000 meq | ORAL | Status: AC
  Administered 2017-09-06 (×2): 40 meq via ORAL
  Filled 2017-09-06 (×2): qty 30

## 2017-09-06 MED ORDER — PRO-STAT SUGAR FREE PO LIQD
30.0000 mL | Freq: Three times a day (TID) | ORAL | Status: DC
  Administered 2017-09-06 – 2017-09-09 (×7): 30 mL via ORAL
  Filled 2017-09-06 (×8): qty 30

## 2017-09-06 MED ORDER — JEVITY 1.5 CAL/FIBER PO LIQD
1000.0000 mL | ORAL | Status: DC
  Administered 2017-09-06 – 2017-09-08 (×2): 1000 mL
  Filled 2017-09-06 (×5): qty 1000

## 2017-09-06 MED ORDER — ADULT MULTIVITAMIN LIQUID CH
15.0000 mL | Freq: Every day | ORAL | Status: DC
  Administered 2017-09-06 – 2017-09-10 (×5): 15 mL via ORAL
  Filled 2017-09-06 (×5): qty 15

## 2017-09-06 MED ORDER — FREE WATER
250.0000 mL | Status: DC
  Administered 2017-09-06 – 2017-09-09 (×19): 250 mL

## 2017-09-06 MED ORDER — AMLODIPINE BESYLATE 5 MG PO TABS
5.0000 mg | ORAL_TABLET | Freq: Every day | ORAL | Status: DC
  Administered 2017-09-06 – 2017-09-07 (×2): 5 mg via ORAL
  Filled 2017-09-06 (×2): qty 1

## 2017-09-06 MED ORDER — FREE WATER
200.0000 mL | Freq: Four times a day (QID) | Status: DC
  Administered 2017-09-06: 200 mL

## 2017-09-06 NOTE — Procedures (Signed)
First Trach Change  Sutures removed, balloon deflated, suction catheter placed, and cuffed trach removed, cuffless trach placed over suction tubing and verified by color change and bilateral BS.  Rush Farmer, M.D. [facility] Pulmonary/Critical Care Medicine. Pager: [phone]. After hours pager: [phone].

## 2017-09-06 NOTE — Progress Notes (Signed)
PULMONARY / CRITICAL CARE MEDICINE   Name: Cassie [name] MRN: [id number] DOB: [birth year]    ADMISSION DATE:  08/17/2017 CONSULTATION DATE:  08/19/2017  REFERRING MD:  Carolin Sicks  CHIEF COMPLAINT:  Dyspnea  HISTORY OF PRESENT ILLNESS:   52 y/o female with minimal past medical history admitted with ARDS from Influenza A. Received Tamiflu, currently enrolled in a phase 2 clinical trial for an influenza investigational agent. She was intubated for airway support and respiratory distress (ARDS) on 1/25. Gradual improvement over the following several days. Attempted extubation on 2/7 but patient had significant stridor and respiratory distress so she was re-intubated shortly after. Tracheostomy placed 2/8.  SUBJECTIVE:  Breathing comfortably on ATC 28%. Wishes she could talk.    VITAL SIGNS: BP (!) 166/101   Pulse (!) 122   Temp 99.4 F (37.4 C) (Axillary)   Resp (!) 22   Ht 5\' 4"  (1.626 m)   Wt 95.6 kg (210 lb 12.2 oz)   LMP 08/17/2017 (Exact Date)   SpO2 90%   BMI 36.18 kg/m   HEMODYNAMICS:    VENTILATOR SETTINGS: FiO2 (%):  [28 %-35 %] 28 %  INTAKE / OUTPUT: I/O last 3 completed shifts: In: 2035 [I.V.:440; NG/GT:1595] Out: 1280 [WUXLK:4401]  PHYSICAL EXAMINATION General: obese female in NAD lying in bed.  Neurological: alert, oriented, non-focal HEENT: PERRL, trach in place 6 cuffed Cardiovascular: tachycardic, no MRG. Pulmonary: clear bilateral breath sounds GI:  Soft, Non-distended, +BS Musculoskeletal: SCD's in place, trace edema bilaterally Skin: no rash or ecchymosis observed  LABS: BMET Recent Labs  Lab 09/04/17 1011 09/05/17 0552 09/06/17 0504  NA 150* 152* 154*  K 3.5 3.4* 3.0*  CL 108 112* 112*  CO2 29 27 26   BUN 32* 32* 36*  CREATININE 0.67 0.83 0.78  GLUCOSE 142* 181* 181*   Electrolytes Recent Labs  Lab 09/02/17 0400  09/04/17 1011 09/05/17 0552 09/06/17 0504  CALCIUM 9.9   < > 10.1 10.2 9.8  MG 1.9  --  2.0 2.1  --   PHOS 3.9  --   3.5 3.1  --    < > = values in this interval not displayed.   CBC Recent Labs  Lab 09/04/17 1011 09/05/17 0552 09/06/17 0504  WBC 9.2 8.3 9.8  HGB 10.1* 10.0* 10.8*  HCT 34.4* 34.4* 36.6  PLT 445* 374 410*   Coag's Recent Labs  Lab 09/01/17 1327 09/02/17 0757  APTT 33  --   INR 1.19 1.23   Sepsis Markers No results for input(s): LATICACIDVEN, PROCALCITON, O2SATVEN in the last 168 hours. ABG Recent Labs  Lab 09/01/17 1116  PHART 7.408  PCO2ART 51.4*  PO2ART 99.0   Liver Enzymes Recent Labs  Lab 09/01/17 1327  AST 32  ALT 46  ALKPHOS 100  BILITOT 0.7  ALBUMIN 2.7*   Cardiac Enzymes No results for input(s): TROPONINI, PROBNP in the last 168 hours.  Glucose Recent Labs  Lab 09/05/17 1513 09/05/17 1934 09/05/17 2121 09/05/17 2357 09/06/17 0350 09/06/17 0754  GLUCAP 145* 111* 134* 160* 141* 161*   Imaging No results found.   STUDIES:  1/25 Echo: LVEF 55-60%, Grade 2 DD, mild left atrial dilation  CULTURES: 1/26 resp > Influenza A positive 1/23 blood > NEG  1/23 RSVP > positive for FLU A H1 2009  ANTIBIOTICS: 1/26 vanc >>> 1/27 1/23 azithro >>> 1/27 1/23 ceftriaxone >>> 1/25 1/26 meropenem >>> 2/2 1/23 tamiflu >>>1/29 1/29 Linezolid >>>2/2  SIGNIFICANT EVENTS: 1/23 admission 1/25 intubation 2/7 extubated, re-intubated 2/8  trach placed  LINES/TUBES: 1/25 ETT >>> 2/7 1/25 R IJ CVL>>> (removed) 1/25 L radial arterial line >>> 1/25 2/4 R PICC >>> 2/7 ETT >> 2/8 2/8 trach >>  DISCUSSION: 52 y/o female with ARDS from severe community acquired pneumonia Influenza A.   ASSESSMENT / PLAN:  ARDS secondary to influenza A - improved: long ICU course on vent. She is status post ABX and tamiflu. Failed extubation due to stridor and upper airway obstruction requiring re-intubation and tracheostomy. She is now tolerating 28% FiO2 via ATC. She is unable to move air around trach with finger occlusion.  - Plan continue supplemental O2 PRN for sats  > 92%, ok to wean to room air if her O2 sat will tolerate.  - Will need to downsize trach once track more mature, approximately 2/15. Either 4 or 6 cuffless.  - Hopeful she can rapidly move towards decannulation.  - Consider IV dexamethasone if this does not improve  Septic shock: resolved  Tachycardia: sinus - Per primary, suspecting this is related to dehydration  Hypernatremia: suspected to by hypovolemic in nature - Free water per primary, follow BMP  Georgann Housekeeper, AGACNP-BC [place] [facility]/Critical Care Pager [phone] or [phone]  09/06/2017 10:41 AM  Attending Note:  52 year old female with Flu A and ARDS that was intubated.  Patient was extubated and subsequently had stridor and hemoptysis then was reintubated and trached.  Patient has done well and was transferred to the SDU and remained off the vent x2 nights and has done well.  I reviewed CXR myself, trach is in good position.  Discussed with PCCM-NP and RT.  Acute respiratory failure with Flu A  - Monitor for airway protection  Flu A:  - Complete course of tamiflu  ARDS:  - No need for steroids at this point  Trach status:  - Change to a cuffless 6 today  Hypoxemia:  - Titrate O2 for sat of 88-92%.  Hypernatremia:  - Increase free water to 250 ml q6  - BMET in AM  PCCM will continue to follow.  Patient seen and examined, agree with above note.  I dictated the care and orders written for this patient under my direction.  Rush Farmer, [place]

## 2017-09-06 NOTE — Procedures (Signed)
Tracheostomy Change Note  Patient Details:   Name: Theo Reither DOB: 05-14-1966 MRN: [id number]    Airway Documentation:     Evaluation  O2 sats: stable throughout Complications: No apparent complications Patient did tolerate procedure well. Bilateral Breath Sounds: Clear, Diminished     RT assisted MD in changing patients trach from a #6 Shiley cuffed to a #6 Shiley cuffless with no complications. Positive color change was noted on CO2 detector. Vitals are stable. RT will continue to monitor.    Cyrena Kuchenbecker Clyda Greener 09/06/2017, 1:59 PM

## 2017-09-06 NOTE — Progress Notes (Signed)
Rehab Admissions Coordinator Note:  Patient was screened by Cleatrice Burke for appropriateness for an Inpatient Acute Rehab Consult per PT recommendation. At this time, we are recommending Inpatient Rehab consult.  Cleatrice Burke 09/06/2017, 4:34 PM  I can be reached at [phone].

## 2017-09-06 NOTE — Progress Notes (Signed)
Nutrition Follow-up  DOCUMENTATION CODES:   Obesity unspecified  INTERVENTION:   -D/c Vital High Protein -Initiate Jevity 1.5 @ 35 ml/hr via cortrak tube  30 ml Prostat TID.    Tube feeding regimen provides 1560 kcal (100% of needs), 99 grams of protein, and 638 ml of H2O. (Total free water: 1638 ml).   -15 ml liquid MVI daily  NUTRITION DIAGNOSIS:   Inadequate oral intake related to inability to eat as evidenced by NPO status.  Ongoing  GOAL:   Patient will meet greater than or equal to 90% of their needs  Met  MONITOR:   Diet advancement, Labs, Weight trends, Skin, TF tolerance, I & O's  REASON FOR ASSESSMENT:   Consult Enteral/tube feeding initiation and management  ASSESSMENT:   52 y/o female PMHx hypothyroidism. Presented w/ SOB/Cough x5 days as well as fever/chills. Worked up for acute respiratory failure w/ hypoxia d/t multifocal PNA and sepsis. Respiratory status continued to decline, developed ARDS and ultimately noninvasive measures failed and was intubated 1/25. Influenza A+. RD consulted for TF.   2/7- extubated, re-intubated due to stridor 2/8- cortrak placed (post-pyloric), s/p trach, bronchoscopy 2/9- transitioned to trach collar 2/10- s/p BSE, recommend NPO  Pt remains on trach collar.   Case discussed with RN, who reports pt is tolerating TF well. She reports ongoing plan for swallow evaluations in hopes that pt will be able to consume a PO diet soon. Plan to downsize trach today.   Vital High Protein infusing via cortrak tube at 55 ml/hr. Pt also receiving 250 ml free water every 4 hours. Complete TF regimen providing 1320 kcals, 116 grams protein, ans 2103 ml free water daily, meeting 91% of estimated kcal needs and 100% of estimated protein needs.   Labs reviewed: Na: 154, CBGS: 141-161 (inpatient orders for glycemic control are 0-15 units insulin aspart every 4 hours).   Diet Order:  Diet NPO time specified  EDUCATION NEEDS:   No  education needs have been identified at this time  Skin:  Skin Assessment: Skin Integrity Issues: Skin Integrity Issues:: Other (Comment) Other: friction wound to rt and lt buttocks  Last BM:  09/06/17  Height:   Ht Readings from Last 1 Encounters:  08/17/17 5' 4" (1.626 m)    Weight:   Wt Readings from Last 1 Encounters:  09/06/17 210 lb 12.2 oz (95.6 kg)    Ideal Body Weight:  54.54 kg  BMI:  Body mass index is 36.18 kg/m.  Estimated Nutritional Needs:   Kcal:  1450-1650  Protein:  95-110 grams  Fluid:  1.4-1.6 L    Arshiya Jakes A. Jimmye Norman, RD, LDN, CDE Pager: [phone] After hours Pager: [phone]

## 2017-09-06 NOTE — Evaluation (Signed)
Physical Therapy Evaluation Patient Details Name: Cassie [name] MRN: [id number] DOB: 1965/10/03 Today's Date: 09/06/2017   History of Present Illness  Pt adm with ARDS due to influenza A. Intubated on 1/25. Extubated on 2/7 but reintubated. Trached 2/8. Currently on trach collar. PMH - hypthyroidism, obesity  Clinical Impression  Pt presents to PT with decr strength, mobility, and balance due to extended hospitalization and illness. Pt very motivated to return to prior level of independence and feel she could benefit from CIR prior to return home.     Follow Up Recommendations CIR    Equipment Recommendations  Other (comment)(To be determined)    Recommendations for Other Services       Precautions / Restrictions Precautions Precautions: Fall Restrictions Weight Bearing Restrictions: No      Mobility  Bed Mobility Overal bed mobility: Needs Assistance Bed Mobility: Supine to Sit     Supine to sit: Min assist;HOB elevated     General bed mobility comments: Assist to elevate trunk into sitting.  Transfers Overall transfer level: Needs assistance Equipment used: Rolling walker (2 wheeled) Transfers: Sit to/from [facility] Sit to Stand: Min assist;+2 safety/equipment Stand pivot transfers: Min assist;+2 safety/equipment       General transfer comment: Assist for balance and to bring hips up. Used walker to take pivotal steps bed to recliner  Ambulation/Gait Ambulation/Gait assistance: Min assist;+2 physical assistance Ambulation Distance (Feet): 60 Feet Assistive device: Rolling walker (2 wheeled) Gait Pattern/deviations: Step-through pattern;Decreased stride length;Trunk flexed;Wide base of support Gait velocity: decr Gait velocity interpretation: Below normal speed for age/gender General Gait Details: Assist for balance and support. Amb on 28% trach collar with SpO2 >90% and HR to 140's.   Stairs            Wheelchair Mobility     Modified Rankin (Stroke Patients Only)       Balance Overall balance assessment: Needs assistance Sitting-balance support: No upper extremity supported;Feet supported Sitting balance-Leahy Scale: Good     Standing balance support: Bilateral upper extremity supported Standing balance-Leahy Scale: Poor Standing balance comment: walker and min assist for static standing                             Pertinent Vitals/Pain Pain Assessment: No/denies pain    Home Living Family/patient expects to be discharged to:: Private residence Living Arrangements: Spouse/significant other Available Help at Discharge: Family         Home Layout: Two level Home Equipment: None Additional Comments: Difficult to get specifics due to trach    Prior Function Level of Independence: Independent               Hand Dominance   Dominant Hand: Right    Extremity/Trunk Assessment   Upper Extremity Assessment Upper Extremity Assessment: Defer to OT evaluation    Lower Extremity Assessment Lower Extremity Assessment: Generalized weakness       Communication   Communication: Tracheostomy  Cognition Arousal/Alertness: Awake/alert Behavior During Therapy: WFL for tasks assessed/performed Overall Cognitive Status: Difficult to assess                                 General Comments: Appears intact      General Comments      Exercises     Assessment/Plan    PT Assessment Patient needs continued PT services  PT Problem List Decreased strength;Decreased  activity tolerance;Decreased balance;Decreased mobility;Decreased knowledge of use of DME;Cardiopulmonary status limiting activity;Obesity       PT Treatment Interventions DME instruction;Gait training;Stair training;Functional mobility training;Therapeutic activities;Therapeutic exercise;Balance training;Patient/family education    PT Goals (Current goals can be found in the Care Plan section)   Acute Rehab PT Goals Patient Stated Goal: return home PT Goal Formulation: With patient Time For Goal Achievement: 09/20/17 Potential to Achieve Goals: Good    Frequency Min 3X/week   Barriers to discharge Inaccessible home environment two level home    Co-evaluation               AM-PAC PT "6 Clicks" Daily Activity  Outcome Measure Difficulty turning over in bed (including adjusting bedclothes, sheets and blankets)?: Unable Difficulty moving from lying on back to sitting on the side of the bed? : Unable Difficulty sitting down on and standing up from a chair with arms (e.g., wheelchair, bedside commode, etc,.)?: Unable Help needed moving to and from a bed to chair (including a wheelchair)?: A Little Help needed walking in hospital room?: A Little Help needed climbing 3-5 steps with a railing? : Total 6 Click Score: 10    End of Session Equipment Utilized During Treatment: Gait belt;Oxygen Activity Tolerance: Patient tolerated treatment well Patient left: in chair;with call bell/phone within reach;with chair alarm set Nurse Communication: Mobility status PT Visit Diagnosis: Unsteadiness on feet (R26.81);Muscle weakness (generalized) (M62.81);Difficulty in walking, not elsewhere classified (R26.2)    Time: 5374-8270 PT Time Calculation (min) (ACUTE ONLY): 31 min   Charges:   PT Evaluation $PT Eval Moderate Complexity: 1 Mod PT Treatments $Gait Training: 8-22 mins   PT G Codes:        [facility] PT [place] 09/06/2017, 3:11 PM

## 2017-09-06 NOTE — Progress Notes (Signed)
  Speech Language Pathology  Patient Details Name: Cassie [name] MRN: [id number] DOB: 1966-03-31 Today's Date: 09/06/2017 Time:  -    Cancel: Pt sleepy this afternoon and unable to efficiently arouse for PMV. Will plan on seeing pt in the am.      [place] Siren 09/06/2017, 4:42 PM

## 2017-09-06 NOTE — Progress Notes (Signed)
PROGRESS NOTE  Cassie [name] DXA:[id number] DOB: March 06, 1966 DOA: 08/17/2017 PCP: Patient, No Pcp Per  HPI/Recap of past 24 hours:  Cassie [name] is a 52 y.o. year old female with medical history significant for hypothyroidism on Armour who presented on 08/17/2017 with cough and shortness of breath and was found to have acute hypoxic respiratory failure secondary to oriented in the setting of severe influenza A pneumonia with presumed superimposed bacterial pneumonia. Has  had a long s, complicated hospital course previously requiring pressor support for septic shock and AKI. she was intubated for respiratory support on 1/25, she failed extubation due to upper airway obstruction and stridor.  Tracheostomy was placed on 2/8.  After tolerating trach collar patient was transferred to stepdown unit on 2/11.  SUBJECTIVE  This morning no complaints.  Mouths words and sentences to me.  Nurse at bedside  Assessment/Plan: Principal Problem:   Multifocal pneumonia Active Problems:   Acute respiratory failure (HCC)   Sepsis ([place])   Hypothyroidism   Influenza A   Acute lung injury   Transaminitis   Electrolyte imbalance   Influenza B   ARDS (adult respiratory distress syndrome) (HCC)   Hyperglycemia   Sinus tachycardia   Hypernatremia    Acute hypoxic respiratory failure, improving ARDS secondary to CAP and influenza A pneumonia, resolved On 5 L via trach Chest x-ray from 2/11 shows no new focal infiltrate, stable Status post completion ofTamiflu, broad-spectrum empiric antibiotics regimen on 2/2 -Pulm following for trach (possible decannulation) - Schedule albuterol nebs TID, as needed Xopenex  Hypovolemic hyponatremia Sodium slowly up trending, currently 154. Likely in setting of decreased oral intake since depending on NG tube and previous IV diuresis for volume overload Free water deficit 3.5 L - Hold off on further diuresis -200 cc free water flushes every 6H to improve  slowly, follow BMP  Hypertension Range 166/101- 181/84 in the last 24 hours. No home blood pressure meds - As needed Hydralazine -Start low-dose amlodipine, monitor  Sinus tachycardia Likely multifactorial etiology: Dehydration and increased thyroid hormone TSH suppressed and T4 high limit of normal Thyroid function could be related to critical illness Unclear amount of thyroid hormone in Armour Do not suspect related to PE given significant improvement in respiratory status -Advise discontinue Armour for now, consider Synthroid replacement deemed necessary -Free water flushes as mentioned above - Monitor  Pharyngeal dysphagia N.p.o. for now Tube feeds via NG tube Speech therapy following  Hyperglycemia No history of diabetes -Obtain A1c -Continue sliding scale insulin, monitor blood glucose levels  Anemia Likely due to critical illness Stable at 10 No signs or symptoms of bleeding -Monitor on CBC  Septic shock, resolved Previously required pressors during day NICU.  For some time   Code Status: Full code  Family Communication: We will call significant other Josh  Disposition Plan: Stepdown unit currently, monitor for improvement in restaurant status   Consultants:  PCCM  Procedures:  1/25 Echo: LVEF 55-60%, Grade 2 DD, mild left atrial dilation    ANTIBIOTICS: 1/26 vanc >>> 1/27 1/23 azithro >>> 1/27 1/23 ceftriaxone >>> 1/25 1/26 meropenem >>> 2/2 1/23 tamiflu >>>1/29 1/29 Linezolid >>>2/2   Cultures: 1/26 resp > Influenza A positive 1/23 blood >  1/23 RSVP > positive for FLU A H1 2009    DVT prophylaxis: Lovenox   Objective: Vitals:   09/06/17 0533 09/06/17 0739 09/06/17 0741 09/06/17 0747  BP:  (!) 166/101    Pulse:  (!) 122    Resp:  (!) 22  Temp:  99.4 F (37.4 C)    TempSrc:  Axillary    SpO2:  90% 90% 90%  Weight: 95.6 kg (210 lb 12.2 oz)     Height:        Intake/Output Summary (Last 24 hours) at 09/06/2017 0839 Last  data filed at 09/06/2017 0745 Gross per 24 hour  Intake 980 ml  Output 910 ml  Net 70 ml   Filed Weights   09/05/17 0414 09/05/17 2116 09/06/17 0533  Weight: 98.8 kg (217 lb 13 oz) 94.8 kg (208 lb 15.9 oz) 95.6 kg (210 lb 12.2 oz)    Exam:  General-lying in bed, no distress HEENT-dry oral mucosa, NG tube in place Cardiovascular- tachycardic, regular rhythm, no appreciable murmurs Respiratory- 5 L via trach, diminished breath sounds throughout, no respiratory distress Gastrointestinal-soft, nondistended, nontender Extremities-no peripheral edema Neuro-alert, oriented, following commands, no appreciable focal deficits  Data Reviewed: CBC: Recent Labs  Lab 09/01/17 1327 09/02/17 0400 09/04/17 1011 09/05/17 0552 09/06/17 0504  WBC 12.9* 12.7* 9.2 8.3 9.8  NEUTROABS 10.9*  --   --   --   --   HGB 9.2* 8.9* 10.1* 10.0* 10.8*  HCT 31.2* 30.0* 34.4* 34.4* 36.6  MCV 79.8 79.6 81.7 80.8 81.2  PLT 365 375 445* 374 540*   Basic Metabolic Panel: Recent Labs  Lab 09/01/17 1807 09/02/17 0300 09/02/17 0400 09/03/17 0437 09/04/17 1011 09/05/17 0552 09/06/17 0504  NA  --   --  144 146* 150* 152* 154*  K  --   --  3.1* 3.5 3.5 3.4* 3.0*  CL  --   --  100* 104 108 112* 112*  CO2  --   --  29 30 29 27 26   GLUCOSE  --   --  159* 185* 142* 181* 181*  BUN  --   --  42* 36* 32* 32* 36*  CREATININE  --   --  0.80 0.68 0.67 0.83 0.78  CALCIUM  --   --  9.9 9.7 10.1 10.2 9.8  MG 2.2 2.0 1.9  --  2.0 2.1  --   PHOS 3.7 4.0 3.9  --  3.5 3.1  --    GFR: Estimated Creatinine Clearance: 92.3 mL/min (by C-G formula based on SCr of 0.78 mg/dL). Liver Function Tests: Recent Labs  Lab 09/01/17 1327  AST 32  ALT 46  ALKPHOS 100  BILITOT 0.7  PROT 7.1  ALBUMIN 2.7*   No results for input(s): LIPASE, AMYLASE in the last 168 hours. No results for input(s): AMMONIA in the last 168 hours. Coagulation Profile: Recent Labs  Lab 09/01/17 1327 09/02/17 0757  INR 1.19 1.23   Cardiac  Enzymes: No results for input(s): CKTOTAL, CKMB, CKMBINDEX, TROPONINI in the last 168 hours. BNP (last 3 results) No results for input(s): PROBNP in the last 8760 hours. HbA1C: No results for input(s): HGBA1C in the last 72 hours. CBG: Recent Labs  Lab 09/05/17 1934 09/05/17 2121 09/05/17 2357 09/06/17 0350 09/06/17 0754  GLUCAP 111* 134* 160* 141* 161*   Lipid Profile: No results for input(s): CHOL, HDL, LDLCALC, TRIG, CHOLHDL, LDLDIRECT in the last 72 hours. Thyroid Function Tests: Recent Labs    09/05/17 0959 09/05/17 1000  TSH 0.110*  --   FREET4  --  1.01   Anemia Panel: No results for input(s): VITAMINB12, FOLATE, FERRITIN, TIBC, IRON, RETICCTPCT in the last 72 hours. Urine analysis:    Component Value Date/Time   COLORURINE YELLOW 08/20/2017 1019   APPEARANCEUR TURBID (  A) 08/20/2017 1019   LABSPEC 1.029 08/20/2017 1019   PHURINE 5.0 08/20/2017 1019   GLUCOSEU 50 (A) 08/20/2017 1019   HGBUR SMALL (A) 08/20/2017 1019   BILIRUBINUR NEGATIVE 08/20/2017 1019   KETONESUR 5 (A) 08/20/2017 1019   PROTEINUR 100 (A) 08/20/2017 1019   NITRITE NEGATIVE 08/20/2017 1019   LEUKOCYTESUR NEGATIVE 08/20/2017 1019   Sepsis Labs: @LABRCNTIP (procalcitonin:4,lacticidven:4)  )No results found for this or any previous visit (from the past 240 hour(s)).    Studies: No results found.  Scheduled Meds: . albuterol  2.5 mg Nebulization TID  . amLODipine  5 mg Oral Daily  . bisacodyl  10 mg Rectal Daily  . docusate  100 mg Per Tube BID  . enoxaparin (LOVENOX) injection  50 mg Subcutaneous Q24H  . feeding supplement (VITAL HIGH PROTEIN)  1,000 mL Per Tube Q24H  . free water  200 mL Per Tube Q6H  . insulin aspart  0-15 Units Subcutaneous Q4H  . mouth rinse  15 mL Mouth Rinse QID  . potassium chloride  40 mEq Oral Q1 Hr x 2  . sodium chloride flush  10-40 mL Intracatheter Q12H  . thyroid  60 mg Oral QAC breakfast    Continuous Infusions: . sodium chloride 10 mL/hr at  09/03/17 0700     LOS: 19 days     Desiree Hane, MD [facility] Pager [phone]  If 7PM-7AM, please contact night-coverage [email or web address] Password [facility] 09/06/2017, 8:39 AM

## 2017-09-07 DIAGNOSIS — E876 Hypokalemia: Secondary | ICD-10-CM

## 2017-09-07 DIAGNOSIS — R7303 Prediabetes: Secondary | ICD-10-CM

## 2017-09-07 DIAGNOSIS — D62 Acute posthemorrhagic anemia: Secondary | ICD-10-CM

## 2017-09-07 DIAGNOSIS — Z930 Tracheostomy status: Secondary | ICD-10-CM

## 2017-09-07 DIAGNOSIS — R0682 Tachypnea, not elsewhere classified: Secondary | ICD-10-CM

## 2017-09-07 DIAGNOSIS — S27309D Unspecified injury of lung, unspecified, subsequent encounter: Secondary | ICD-10-CM

## 2017-09-07 DIAGNOSIS — R000 Tachycardia, unspecified: Secondary | ICD-10-CM

## 2017-09-07 DIAGNOSIS — R1312 Dysphagia, oropharyngeal phase: Secondary | ICD-10-CM

## 2017-09-07 DIAGNOSIS — E039 Hypothyroidism, unspecified: Secondary | ICD-10-CM

## 2017-09-07 DIAGNOSIS — I10 Essential (primary) hypertension: Secondary | ICD-10-CM

## 2017-09-07 LAB — GLUCOSE, CAPILLARY
GLUCOSE-CAPILLARY: 146 mg/dL — AB (ref 65–99)
Glucose-Capillary: 145 mg/dL — ABNORMAL HIGH (ref 65–99)
Glucose-Capillary: 133 mg/dL — ABNORMAL HIGH (ref 65–99)
Glucose-Capillary: 147 mg/dL — ABNORMAL HIGH (ref 65–99)
Glucose-Capillary: 109 mg/dL — ABNORMAL HIGH (ref 65–99)

## 2017-09-07 LAB — BASIC METABOLIC PANEL
Anion gap: 12 (ref 5–15)
BUN: 32 mg/dL — ABNORMAL HIGH (ref 6–20)
CO2: 25 mmol/L (ref 22–32)
CREATININE: 0.67 mg/dL (ref 0.44–1.00)
Calcium: 9.5 mg/dL (ref 8.9–10.3)
Chloride: 113 mmol/L — ABNORMAL HIGH (ref 101–111)
GFR calc Af Amer: >60 mL/min (ref >60)
Glucose, Bld: 143 mg/dL — ABNORMAL HIGH (ref 65–99)
POTASSIUM: 3.2 mmol/L — AB (ref 3.5–5.1)
SODIUM: 150 mmol/L — AB (ref 135–145)

## 2017-09-07 MED ORDER — POTASSIUM CHLORIDE CRYS ER 20 MEQ PO TBCR
40.0000 meq | EXTENDED_RELEASE_TABLET | Freq: Once | ORAL | Status: AC
  Administered 2017-09-07: 40 meq via ORAL
  Filled 2017-09-07: qty 2

## 2017-09-07 MED ORDER — DEXTROSE 5 % IV SOLN: INTRAVENOUS | Status: DC

## 2017-09-07 MED ORDER — DEXTROSE 5 % IV SOLN
INTRAVENOUS | Status: AC
  Administered 2017-09-07: 09:00:00 via INTRAVENOUS

## 2017-09-07 NOTE — Progress Notes (Addendum)
  Speech Language Pathology Treatment: Cassie [name] Speaking valve  Patient Details Name: Cassie [name] MRN: [id number] DOB: Nov 02, 1965 Today's Date: 09/07/2017 Time: 1017-5102 SLP Time Calculation (min) (ACUTE ONLY): 24 min  Assessment / Plan / Recommendation Clinical Impression  Skilled ST session to facilitate increase duration and efficiency with speaking valve who is alert and cooperative. Throughout session valve remained on trach hub from 2 seconds increasing up to 4-5 minutes. No true vocalization achieved and suspect she may have edema that could be affecting and preventing adequate airflow to and through vocal cords. Intelligibility was reduced and pt needed to write response for SLP comprehension x 1. RR 22, SpO2 93% and HR 110. Prognosis is good for future vocalization. Recommend PMV with SLP only and will continue intervention with possible/likely swallow re-assessment next visit.    HPI HPI: 52 y/o female with minimal past medical history admitted with ARDS from Influenza A. Received Tamiflu, currently enrolled in a phase 2 clinical trial for an influenza investigational agent. She was intubated for airway support and respiratory distress (ARDS) on 1/25. Gradual improvement over the following several days. Attempted extubation on 2/7 but patient had significant stridor and respiratory distress so she was re-intubated shortly after. Tracheostomy placed 2/8.      SLP Plan  Continue with current plan of care       Recommendations         Patient may use Passy-Muir Speech Valve: with SLP only PMSV Supervision: Full MD: Please consider changing trach tube to : Smaller size         Oral Care Recommendations: Oral care QID Follow up Recommendations: Inpatient Rehab SLP Visit Diagnosis: Aphonia (R49.1) Plan: Continue with current plan of care                      Cassie [name] 09/07/2017, 1:10 PM  Cassie [name] M.Ed [employer] [phone]

## 2017-09-07 NOTE — Care Management Note (Signed)
Case Management Note  Patient Details  Name: Cassie [name] MRN: [id number] Date of Birth: [birth year]  Subjective/Objective:      Pt admitted with ARDs secondary to flu              Action/Plan:  From home.  Still requiring ventilator    Expected Discharge Date:                  Expected Discharge Plan:  Home/Self Care  In-House Referral:     Discharge planning Services  CM Consult, Medication Assistance  Post Acute Care Choice:    Choice offered to:     DME Arranged:    DME Agency:     HH Arranged:    HH Agency:     Status of Service:  In process, will continue to follow  If discussed at Long Length of Stay Meetings, dates discussed:    Additional Comments: 09/07/2017 CIR recommended - CSW consulted for back up plan  09/05/17 Pt was completely independent from home PTA.  Pt now has trach - TC over 24 hours. PT eval ordered [name], RN 09/07/2017, 1:42 PM

## 2017-09-07 NOTE — Evaluation (Signed)
Occupational Therapy Evaluation Patient Details Name: Starla Deller MRN: [id number] DOB: 07-Mar-1966 Today's Date: 09/07/2017    History of Present Illness Pt adm with ARDS due to influenza A. Intubated on 1/25. Extubated on 2/7 but reintubated. Trached 2/8. Currently on trach collar. PMH - hypthyroidism, obesity   Clinical Impression   PTA Pt independent - works in office administration, drives etc. Pt is currently mod A overall for ADL. Mod A for SPT to recliner. +2 helpful for line management. Pt with balance deficits in sitting attempting to don socks, requires stability in static standing for grooming tasks. Pt will benefit from skilled OT in the acute setting and will require CIR level therapy at dc to return to PLOF. Next session - please work on grooming tasks (potentially shower cap and combing out hair) and challenging balance during functional ADL tasks.     Follow Up Recommendations  CIR;Supervision/Assistance - 24 hour    Equipment Recommendations  Other (comment)(defer to next venue)    Recommendations for Other Services Rehab consult     Precautions / Restrictions Precautions Precautions: Fall Restrictions Weight Bearing Restrictions: No      Mobility Bed Mobility Overal bed mobility: Needs Assistance Bed Mobility: Supine to Sit     Supine to sit: Min assist;HOB elevated     General bed mobility comments: Assist to elevate trunk into sitting.  Transfers Overall transfer level: Needs assistance Equipment used: None Transfers: Sit to/from [facility] Sit to Stand: Mod assist Stand pivot transfers: Mod assist       General transfer comment: Assist for balance and to bring hips up. Used HHA to take pivotal steps bed to recliner    Balance Overall balance assessment: Needs assistance Sitting-balance support: No upper extremity supported;Feet supported Sitting balance-Leahy Scale: Good     Standing balance support: Single extremity  supported;During functional activity Standing balance-Leahy Scale: Poor Standing balance comment: dependent on external support                           ADL either performed or assessed with clinical judgement   ADL Overall ADL's : Needs assistance/impaired Eating/Feeding: NPO   Grooming: Set up;Sitting   Upper Body Bathing: Moderate assistance;Sitting   Lower Body Bathing: Moderate assistance;Sit to/from stand   Upper Body Dressing : Moderate assistance   Lower Body Dressing: Moderate assistance;Sit to/from stand   Toilet Transfer: Moderate assistance;Stand-pivot Toilet Transfer Details (indicate cue type and reason): mod A for initial boost, assist for balance with pivotal steps to recliner Toileting- Clothing Manipulation and Hygiene: Moderate assistance;Sit to/from stand       Functional mobility during ADLs: Moderate assistance General ADL Comments: HHA for balance only     Vision Patient Visual Report: No change from baseline       Perception     Praxis      Pertinent Vitals/Pain Pain Assessment: No/denies pain Pain Intervention(s): Monitored during session     Hand Dominance Right   Extremity/Trunk Assessment Upper Extremity Assessment Upper Extremity Assessment: Generalized weakness(able to hold pen to write)   Lower Extremity Assessment Lower Extremity Assessment: Generalized weakness       Communication Communication Communication: Tracheostomy   Cognition Arousal/Alertness: Awake/alert Behavior During Therapy: WFL for tasks assessed/performed Overall Cognitive Status: Difficult to assess  General Comments: Appears intact   General Comments       Exercises     Shoulder Instructions      Home Living Family/patient expects to be discharged to:: Private residence Living Arrangements: Spouse/significant other Available Help at Discharge: Family Type of Home: House       Home  Layout: Two level Alternate Level Stairs-Number of Steps: flight   Bathroom Shower/Tub: Teacher, early years/pre: Standard     Home Equipment: None   Additional Comments: Difficult to get specifics due to trach      Prior Functioning/Environment Level of Independence: Independent                 OT Problem List: Decreased strength;Decreased activity tolerance;Impaired balance (sitting and/or standing);Decreased safety awareness;Decreased knowledge of use of DME or AE;Obesity      OT Treatment/Interventions: Self-care/ADL training;Therapeutic exercise;Energy conservation;DME and/or AE instruction;Manual therapy;Therapeutic activities;Patient/family education;Balance training    OT Goals(Current goals can be found in the care plan section) Acute Rehab OT Goals Patient Stated Goal: get stronger and back to PLOF prior to return home OT Goal Formulation: With patient Time For Goal Achievement: 09/21/17 Potential to Achieve Goals: Good ADL Goals Pt Will Perform Grooming: standing;with supervision Pt Will Perform Upper Body Bathing: with modified independence;standing Pt Will Perform Lower Body Bathing: with modified independence;sitting/lateral leans Pt Will Transfer to Toilet: with modified independence;ambulating Pt Will Perform Toileting - Clothing Manipulation and hygiene: with modified independence;sit to/from stand  OT Frequency: Min 3X/week   Barriers to D/C:            Co-evaluation              AM-PAC PT "6 Clicks" Daily Activity     Outcome Measure Help from another person eating meals?: Total(NPO) Help from another person taking care of personal grooming?: A Lot Help from another person toileting, which includes using toliet, bedpan, or urinal?: A Lot Help from another person bathing (including washing, rinsing, drying)?: A Lot Help from another person to put on and taking off regular upper body clothing?: A Lot Help from another person to put  on and taking off regular lower body clothing?: A Lot 6 Click Score: 11   End of Session Equipment Utilized During Treatment: Gait belt;Oxygen Nurse Communication: Mobility status(in room providing medicine at the time of exit)  Activity Tolerance: Patient tolerated treatment well Patient left: in chair;with call bell/phone within reach;with nursing/sitter in room  OT Visit Diagnosis: Unsteadiness on feet (R26.81);Other abnormalities of gait and mobility (R26.89);Muscle weakness (generalized) (M62.81)                Time: 5038-8828 OT Time Calculation (min): 29 min Charges:  OT General Charges $OT Visit: 1 Visit OT Evaluation $OT Eval Moderate Complexity: 1 Mod OT Treatments $Self Care/Home Management : 8-22 mins G-Codes:     Hulda Humphrey OTR/L [place] 09/07/2017, 6:04 PM

## 2017-09-07 NOTE — Progress Notes (Signed)
I will meet with pt and family tomorrow to begin discussions for a possible inpt rehab admit. [phone]

## 2017-09-07 NOTE — Progress Notes (Signed)
PROGRESS NOTE  Cassie [name] CHY:[id number] DOB: [birth year] DOA: 08/17/2017 PCP: Patient, No Pcp Per  HPI/Recap of past 24 hours:  Cassie [name] is a 52 y.o. year old female with medical history significant for hypothyroidism on Armour who presented on 08/17/2017 with cough and shortness of breath and was found to have acute hypoxic respiratory failure secondary to oriented in the setting of severe influenza A pneumonia with presumed superimposed bacterial pneumonia. Has  had a long s, complicated hospital course previously requiring pressor support for septic shock and AKI. she was intubated for respiratory support on 1/25, she failed extubation due to upper airway obstruction and stridor.  Tracheostomy was placed on 2/8.  After tolerating trach collar patient was transferred to stepdown unit on 2/11.  SUBJECTIVE Mouth words and communicate that way. Feels dehydrated.   Assessment/Plan: Principal Problem:   Multifocal pneumonia Active Problems:   Acute respiratory failure (HCC)   Sepsis (HCC)   Hypothyroidism   Influenza A   Acute lung injury   Transaminitis   Electrolyte imbalance   Influenza B   ARDS (adult respiratory distress syndrome) (HCC)   Hyperglycemia   Sinus tachycardia   Hypernatremia   Acute respiratory failure with hypoxemia (HCC)   Tracheostomy status (HCC)    Acute hypoxic respiratory failure, improving ARDS secondary to CAP and influenza A pneumonia, resolved On 5 L via trach Chest x-ray from 2/11 shows no new focal infiltrate, stable Status post completion ofTamiflu, broad-spectrum empiric antibiotics regimen on 2/2 -Pulm following for trach. Cuffless placed over suction tubing  - Schedule albuterol nebs TID, as needed Xopenex  Hypovolemic hyponatremia sodium decreasing today at 150.  Likely in setting of decreased oral intake since depending on NG tube and previous IV diuresis for volume overload - Hold off on further diuresis -on Free water 250  Q 4 hours.   Hypertension Range 166/101- 181/84 in the last 24 hours. No home blood pressure meds - As needed Hydralazine -continue low dose Norvasc.   Sinus tachycardia Likely multifactorial etiology: Dehydration and increased thyroid hormone TSH suppressed and T4 high limit of normal Thyroid function could be related to critical illness Unclear amount of thyroid hormone in Armour -Advise discontinue Armour for now, consider Synthroid replacement deemed necessary -Free water flushes as mentioned above -repeat TSH and free T 4, Free T 3.  -will order low rate IV fluids for 6 hours.  If tachycardia doesn't improved could use metoprolol low dose.   Pharyngeal dysphagia N.p.o. for now Tube feeds via NG tube Speech therapy following  Hyperglycemia No history of diabetes -Obtain A1c -Continue sliding scale insulin, monitor blood glucose levels  Anemia Likely due to critical illness Stable at 10 No signs or symptoms of bleeding -Monitor on CBC  Septic shock, resolved Previously required pressors during day NICU.  For some time   Code Status: Full code  Family Communication: no family at bedside.   Disposition Plan: CIR when ok By CCM   Consultants:  PCCM  Procedures:  1/25 Echo: LVEF 55-60%, Grade 2 DD, mild left atrial dilation    ANTIBIOTICS: 1/26 vanc >>> 1/27 1/23 azithro >>> 1/27 1/23 ceftriaxone >>> 1/25 1/26 meropenem >>> 2/2 1/23 tamiflu >>>1/29 1/29 Linezolid >>>2/2   Cultures: 1/26 resp > Influenza A positive 1/23 blood >  1/23 RSVP > positive for FLU A H1 2009    DVT prophylaxis: Lovenox   Objective: Vitals:   09/06/17 2347 09/07/17 0334 09/07/17 0404 09/07/17 0504  BP:  (!) 176/118  Pulse: (!) 103  (!) 103   Resp: (!) 33 (!) 21 18   Temp:  98.6 F (37 C)    TempSrc:  Oral    SpO2: 93% 96% 98%   Weight:    95.2 kg (209 lb 14.1 oz)  Height:        Intake/Output Summary (Last 24 hours) at 09/07/2017 0837 Last data filed at  09/06/2017 1244 Gross per 24 hour  Intake 420 ml  Output 450 ml  Net -30 ml   Filed Weights   09/05/17 2116 09/06/17 0533 09/07/17 0504  Weight: 94.8 kg (208 lb 15.9 oz) 95.6 kg (210 lb 12.2 oz) 95.2 kg (209 lb 14.1 oz)    Exam:  General; no acute distress.  HEENT-NG tube in place.  Cardiovascular-S 1, S 2 RRR tachycardia Respiratory- Bilateral ronchus, on 5 L.  Gastrointestinal-Soft, NT Extremities-no edema Neuro-alert, moves all four extremities.   Data Reviewed: CBC: Recent Labs  Lab 09/01/17 1327 09/02/17 0400 09/04/17 1011 09/05/17 0552 09/06/17 0504  WBC 12.9* 12.7* 9.2 8.3 9.8  NEUTROABS 10.9*  --   --   --   --   HGB 9.2* 8.9* 10.1* 10.0* 10.8*  HCT 31.2* 30.0* 34.4* 34.4* 36.6  MCV 79.8 79.6 81.7 80.8 81.2  PLT 365 375 445* 374 119*   Basic Metabolic Panel: Recent Labs  Lab 09/01/17 1807 09/02/17 0300 09/02/17 0400 09/03/17 0437 09/04/17 1011 09/05/17 0552 09/06/17 0504 09/07/17 0426  NA  --   --  144 146* 150* 152* 154* 150*  K  --   --  3.1* 3.5 3.5 3.4* 3.0* 3.2*  CL  --   --  100* 104 108 112* 112* 113*  CO2  --   --  29 30 29 27 26 25   GLUCOSE  --   --  159* 185* 142* 181* 181* 143*  BUN  --   --  42* 36* 32* 32* 36* 32*  CREATININE  --   --  0.80 0.68 0.67 0.83 0.78 0.67  CALCIUM  --   --  9.9 9.7 10.1 10.2 9.8 9.5  MG 2.2 2.0 1.9  --  2.0 2.1  --   --   PHOS 3.7 4.0 3.9  --  3.5 3.1  --   --    GFR: Estimated Creatinine Clearance: 92.1 mL/min (by C-G formula based on SCr of 0.67 mg/dL). Liver Function Tests: Recent Labs  Lab 09/01/17 1327  AST 32  ALT 46  ALKPHOS 100  BILITOT 0.7  PROT 7.1  ALBUMIN 2.7*   No results for input(s): LIPASE, AMYLASE in the last 168 hours. No results for input(s): AMMONIA in the last 168 hours. Coagulation Profile: Recent Labs  Lab 09/01/17 1327 09/02/17 0757  INR 1.19 1.23   Cardiac Enzymes: No results for input(s): CKTOTAL, CKMB, CKMBINDEX, TROPONINI in the last 168 hours. BNP (last 3  results) No results for input(s): PROBNP in the last 8760 hours. HbA1C: Recent Labs    09/06/17 0504  HGBA1C 5.9*   CBG: Recent Labs  Lab 09/06/17 1241 09/06/17 1628 09/06/17 1954 09/06/17 2334 09/07/17 0338  GLUCAP 153* 143* 149* 152* 146*   Lipid Profile: No results for input(s): CHOL, HDL, LDLCALC, TRIG, CHOLHDL, LDLDIRECT in the last 72 hours. Thyroid Function Tests: Recent Labs    09/05/17 0959 09/05/17 1000  TSH 0.110*  --   FREET4  --  1.01   Anemia Panel: No results for input(s): VITAMINB12, FOLATE, FERRITIN, TIBC, IRON, RETICCTPCT in  the last 72 hours. Urine analysis:    Component Value Date/Time   COLORURINE YELLOW 08/20/2017 1019   APPEARANCEUR TURBID (A) 08/20/2017 1019   LABSPEC 1.029 08/20/2017 1019   PHURINE 5.0 08/20/2017 1019   GLUCOSEU 50 (A) 08/20/2017 1019   HGBUR SMALL (A) 08/20/2017 1019   BILIRUBINUR NEGATIVE 08/20/2017 1019   KETONESUR 5 (A) 08/20/2017 1019   PROTEINUR 100 (A) 08/20/2017 1019   NITRITE NEGATIVE 08/20/2017 1019   LEUKOCYTESUR NEGATIVE 08/20/2017 1019   Sepsis Labs: @LABRCNTIP (procalcitonin:4,lacticidven:4)  )No results found for this or any previous visit (from the past 240 hour(s)).    Studies: No results found.  Scheduled Meds: . albuterol  2.5 mg Nebulization TID  . amLODipine  5 mg Oral Daily  . bisacodyl  10 mg Rectal Daily  . docusate  100 mg Per Tube BID  . enoxaparin (LOVENOX) injection  50 mg Subcutaneous Q24H  . feeding supplement (JEVITY 1.5 CAL/FIBER)  1,000 mL Per Tube Q24H  . feeding supplement (PRO-STAT SUGAR FREE 64)  30 mL Oral TID  . free water  250 mL Per Tube Q4H  . insulin aspart  0-15 Units Subcutaneous Q4H  . mouth rinse  15 mL Mouth Rinse QID  . multivitamin  15 mL Oral Daily  . sodium chloride flush  10-40 mL Intracatheter Q12H    Continuous Infusions: . sodium chloride 10 mL/hr at 09/03/17 0700     LOS: 20 days     Elmarie Shiley, MD [facility] Pager  [phone]  If 7PM-7AM, please contact night-coverage [email or web address] Password [facility] 09/07/2017, 8:37 AM

## 2017-09-07 NOTE — Consult Note (Signed)
Physical Medicine and Rehabilitation Consult   Reason for Consult:  Debility Referring Physician: Dr. Lonny Prude   HPI: Cassie [name] is a 52 y.o. female with history of hypothyroid, obesity, recent travel;  who was admitted on 08/17/17 with acute hypoxic respiratory failure due to severe influenza A superimposed on  pneumonia. History taken from chart review. She was treated with IVIG X 2 v/s placebo for influenza A phase two study. She continued to have fevers and was treated with antibiotics, Tamiflu as well as IV lasix but developed ARDS with septic shock requiring intubation. She had difficulty with extubation attempts due to stridor requiring tracheostomy on 2/8. She was extubated to ATC and downsized to #6 cuffed on 2/13. To start PMSV trials and NPO pending repeat MBS.  PT evaluation done yesterday and CIR recommended due to debility.      Review of Systems  Unable to perform ROS: Patient nonverbal  All other systems reviewed and are negative.     Past Medical History:  Diagnosis Date  . Hypothyroidism     Past Surgical History:  Procedure Laterality Date  . DENTAL SURGERY      Family History  Problem Relation Age of Onset  . Kidney disease Father     Social History:  reports that  has never smoked. she has never used smokeless tobacco. She reports that she does not drink alcohol or use drugs.    Allergies  Allergen Reactions  . Sulfa Antibiotics     Medications Prior to Admission  Medication Sig Dispense Refill  . Aspirin-Salicylamide-Caffeine (BC HEADACHE POWDER PO) Take 1 Package by mouth as needed (pain).    . Diphenhydramine-PE-APAP (DELSYM COUGH/COLD NIGHT TIME PO) Take 5 mLs by mouth as needed (cough).    . Ginger Root POWD Take 2.5 mLs by mouth daily. Dissolve in tea    . Kava, Piper methysticum, (KAVA KAVA ROOT PO) Take 2.5 mLs by mouth daily. Dissolve in tea    . pseudoephedrine (SUDAFED) 120 MG 12 hr tablet Take 120 mg by mouth daily as needed  for congestion.    Marland Kitchen thyroid (ARMOUR) 90 MG tablet Take 90 mg by mouth daily.    . Turmeric 400 MG CAPS Take by mouth.      Home: Home Living Family/patient expects to be discharged to:: Private residence Living Arrangements: Spouse/significant other Available Help at Discharge: Family Home Layout: [place]: None Additional Comments: Difficult to get specifics due to trach  Functional History: Prior Function Level of Independence: Independent Functional Status:  Mobility: Bed Mobility Overal bed mobility: Needs Assistance Bed Mobility: Supine to Sit Supine to sit: Min assist, HOB elevated General bed mobility comments: Assist to elevate trunk into sitting. Transfers Overall transfer level: Needs assistance Equipment used: Rolling walker (2 wheeled) Transfers: Sit to/from Stand, Stand Pivot Transfers Sit to Stand: Min assist, +2 safety/equipment Stand pivot transfers: Min assist, +2 safety/equipment General transfer comment: Assist for balance and to bring hips up. Used walker to take pivotal steps bed to recliner Ambulation/Gait Ambulation/Gait assistance: Min assist, +2 physical assistance Ambulation Distance (Feet): 60 Feet Assistive device: Rolling walker (2 wheeled) Gait Pattern/deviations: Step-through pattern, Decreased stride length, Trunk flexed, Wide base of support General Gait Details: Assist for balance and support. Amb on 28% trach collar with SpO2 >90% and HR to 140's.  Gait velocity: decr Gait velocity interpretation: Below normal speed for age/gender    ADL:    Cognition: Cognition Overall Cognitive Status: Difficult to  assess Orientation Level: Oriented to person Cognition Arousal/Alertness: Awake/alert Behavior During Therapy: WFL for tasks assessed/performed Overall Cognitive Status: Difficult to assess General Comments: Appears intact Difficult to assess due to: Tracheostomy   Blood pressure (!) 176/118, pulse (!) 103,  temperature 98.6 F (37 C), temperature source Oral, resp. rate 18, height 5\' 4"  (1.626 m), weight 95.2 kg (209 lb 14.1 oz), last menstrual period 08/17/2017, SpO2 98 %. Physical Exam  Vitals reviewed. Constitutional: She appears well-developed.  Obese  HENT:  Facial edema  Eyes: EOM are normal. Right eye exhibits no discharge. Left eye exhibits no discharge.  Neck:  + Trach  Cardiovascular: Regular rhythm.  + Tachycardia  Respiratory: Effort normal and breath sounds normal.  GI: Soft. Bowel sounds are normal.  Musculoskeletal:  No edema or tenderness in extremities  Neurological: She is alert.  Motor: 4/5 grossly throughout  Skin: Skin is warm and dry.  Psychiatric: She has a normal mood and affect. Her behavior is normal.    Results for orders placed or performed during the hospital encounter of 08/17/17 (from the past 24 hour(s))  Glucose, capillary     Status: Abnormal   Collection Time: 09/06/17 12:41 PM  Result Value Ref Range   Glucose-Capillary 153 (H) 65 - 99 mg/dL   Comment 1 Notify RN    Comment 2 Document in Chart   Glucose, capillary     Status: Abnormal   Collection Time: 09/06/17  4:28 PM  Result Value Ref Range   Glucose-Capillary 143 (H) 65 - 99 mg/dL   Comment 1 Notify RN    Comment 2 Document in Chart   Glucose, capillary     Status: Abnormal   Collection Time: 09/06/17  7:54 PM  Result Value Ref Range   Glucose-Capillary 149 (H) 65 - 99 mg/dL   Comment 1 Notify RN    Comment 2 Document in Chart   Glucose, capillary     Status: Abnormal   Collection Time: 09/06/17 11:34 PM  Result Value Ref Range   Glucose-Capillary 152 (H) 65 - 99 mg/dL   Comment 1 Notify RN    Comment 2 Document in Chart   Glucose, capillary     Status: Abnormal   Collection Time: 09/07/17  3:38 AM  Result Value Ref Range   Glucose-Capillary 146 (H) 65 - 99 mg/dL   Comment 1 Notify RN    Comment 2 Document in Chart   Basic metabolic panel     Status: Abnormal   Collection  Time: 09/07/17  4:26 AM  Result Value Ref Range   Sodium 150 (H) 135 - 145 mmol/L   Potassium 3.2 (L) 3.5 - 5.1 mmol/L   Chloride 113 (H) 101 - 111 mmol/L   CO2 25 22 - 32 mmol/L   Glucose, Bld 143 (H) 65 - 99 mg/dL   BUN 32 (H) 6 - 20 mg/dL   Creatinine, Ser 0.67 0.44 - 1.00 mg/dL   Calcium 9.5 8.9 - 10.3 mg/dL   GFR calc non Af Amer >60 >60 mL/min   GFR calc Af Amer >60 >60 mL/min   Anion gap 12 5 - 15   No results found.  Assessment/Plan: Diagnosis: Debility Labs independently reviewed.  Records reviewed and summated above.  1. Does the need for close, 24 hr/day medical supervision in concert with the patient's rehab needs make it unreasonable for this patient to be served in a less intensive setting? Yes  2. Co-Morbidities requiring supervision/potential complications: NPO (advance diet as  tolerated), tracheostomy status, ARDS with septic shock, hypothyroidism (cont meds, ensure appropriate mood and energy level for therapies), morbid obesity (Body mass index is 36.03 kg/m., diet and exercise education, encourage weight loss to increase endurance and promote overall health), Tachycardia (monitor in accordance with pain and increasing activity), tachypnea (monitor RR and O2 Sats with increased physical exertion), HTN (monitor and provide prns in accordance with increased physical exertion and pain), prediabetes (Monitor in accordance with exercise and adjust meds as necessary), hypernatremia (cont to monitor, treat if necessary), hypokalemia (continue to monitor and replete as necessary), ABLA (transfuse if necessary to ensure appropriate perfusion for increased activity tolerance) 3. Due to safety, skin/wound care, disease management and patient education, does the patient require 24 hr/day rehab nursing? Yes 4. Does the patient require coordinated care of a physician, rehab nurse, PT (1-2 hrs/day, 5 days/week), OT (1-2 hrs/day, 5 days/week) and SLP (1-2 hrs/day, 5 days/week) to address  physical and functional deficits in the context of the above medical diagnosis(es)? Yes Addressing deficits in the following areas: balance, endurance, locomotion, strength, transferring, bathing, dressing, feeding, grooming, toileting, speech, swallowing and psychosocial support 5. Can the patient actively participate in an intensive therapy program of at least 3 hrs of therapy per day at least 5 days per week? Potentially 6. The potential for patient to make measurable gains while on inpatient rehab is excellent 7. Anticipated functional outcomes upon discharge from inpatient rehab are modified independent and supervision  with PT, modified independent and supervision with OT, modified independent with SLP. 8. Estimated rehab length of stay to reach the above functional goals is: 16-19 days. 9. Anticipated D/C setting: Home 10. Anticipated post D/C treatments: HH therapy and Home excercise program 11. Overall Rehab/Functional Prognosis: excellent  RECOMMENDATIONS: This patient's condition is appropriate for continued rehabilitative care in the following setting: CIR once medically stable Patient has agreed to participate in recommended program. Yes Note that insurance prior authorization may be required for reimbursement for recommended care.  Comment: Rehab Admissions Coordinator to follow up.  Delice Lesch, MD, ABPMR Bary Leriche, PA-C 09/07/2017

## 2017-09-07 NOTE — Research (Signed)
Title: A Randomized, Double-Blind, Placebo-Controlled Dose Ranging Study Evaluating the Safety Pharmacokinetics and Clinical Benefit of FLU-IGIV in Hospitalized Patients with Serious Influenza A infection. IA-001 (ClinicalTrials.gov Identifier: [id number], Protocol No: IA-001, [facility] Protocol #[id number])  RESEARCH SUBJECT. This research study is sponsored by [employer]   Protocol:  - amendment : #4 -> 16 Dec 2016   -  Administrative Change Memo - 03/JAN/2019  - Investigator Bronchure: 4.0 - 16 Dec 2016 - double checked to be correct YES  ................................................................................................... Clinical Research Coordinator / Research RN note : This visit for Subject Cassie [name] with DOB: May 01, 1966 on 09/07/2017 for the above protocol is Visit/Encounter # Day 48  and is for purpose of study assessment. .   In this visit 09/07/2017 the subject is evaluated by investigator named Dr. Asencion Noble . This research coordinator has verified that the investigator Dr. Joya Gaskins is uptodate with his training logs.  During this visit subject was alert, oriented and awake at time of assesment. She was able to follow commands and respond by nodding her head to my questions. Flu assessment done- have cough. She is on 5L of oxygen with 96% SpO2.  Rest of the visit was conducted as per the protocol. Please refer to paper source binder for further documentation.    Signed by  Basye Coordinator [facility] [employer]  [place], [place] 11:27 AM 09/07/2017

## 2017-09-08 ENCOUNTER — Inpatient Hospital Stay (HOSPITAL_COMMUNITY): Payer: Self-pay

## 2017-09-08 DIAGNOSIS — J9621 Acute and chronic respiratory failure with hypoxia: Secondary | ICD-10-CM

## 2017-09-08 LAB — GLUCOSE, CAPILLARY
GLUCOSE-CAPILLARY: 120 mg/dL — AB (ref 65–99)
GLUCOSE-CAPILLARY: 144 mg/dL — AB (ref 65–99)
GLUCOSE-CAPILLARY: 160 mg/dL — AB (ref 65–99)
GLUCOSE-CAPILLARY: 161 mg/dL — AB (ref 65–99)
Glucose-Capillary: 133 mg/dL — ABNORMAL HIGH (ref 65–99)
Glucose-Capillary: 132 mg/dL — ABNORMAL HIGH (ref 65–99)

## 2017-09-08 LAB — BASIC METABOLIC PANEL
Anion gap: 13 (ref 5–15)
BUN: 23 mg/dL — AB (ref 6–20)
CHLORIDE: 104 mmol/L (ref 101–111)
CO2: 25 mmol/L (ref 22–32)
Calcium: 9.1 mg/dL (ref 8.9–10.3)
Creatinine, Ser: 0.57 mg/dL (ref 0.44–1.00)
GFR calc Af Amer: >60 mL/min (ref >60)
GFR calc non Af Amer: >60 mL/min (ref >60)
GLUCOSE: 161 mg/dL — AB (ref 65–99)
POTASSIUM: 2.9 mmol/L — AB (ref 3.5–5.1)
Sodium: 142 mmol/L (ref 135–145)

## 2017-09-08 MED ORDER — ADULT MULTIVITAMIN LIQUID CH: 15.0000 mL | Freq: Every day | ORAL | 0 refills | Status: DC

## 2017-09-08 MED ORDER — AMLODIPINE BESYLATE 10 MG PO TABS
10.0000 mg | ORAL_TABLET | Freq: Every day | ORAL | Status: DC
  Administered 2017-09-08 – 2017-09-10 (×3): 10 mg via ORAL
  Filled 2017-09-08 (×3): qty 1

## 2017-09-08 MED ORDER — JEVITY 1.5 CAL/FIBER PO LIQD: 1000.0000 mL | ORAL | 0 refills | Status: DC

## 2017-09-08 MED ORDER — FREE WATER
250.0000 mL | 0 refills | Status: DC
Start: 2017-09-08 — End: 2017-09-10

## 2017-09-08 MED ORDER — RESOURCE THICKENUP CLEAR PO POWD
ORAL | Status: DC | PRN
  Filled 2017-09-08 (×3): qty 125

## 2017-09-08 MED ORDER — AMLODIPINE BESYLATE 10 MG PO TABS: 10.0000 mg | ORAL_TABLET | Freq: Every day | ORAL | 0 refills | Status: DC

## 2017-09-08 MED ORDER — POTASSIUM CHLORIDE CRYS ER 20 MEQ PO TBCR
40.0000 meq | EXTENDED_RELEASE_TABLET | ORAL | Status: DC
  Administered 2017-09-08: 40 meq via ORAL
  Filled 2017-09-08 (×2): qty 2

## 2017-09-08 MED ORDER — POTASSIUM CHLORIDE 20 MEQ/15ML (10%) PO SOLN
40.0000 meq | Freq: Once | ORAL | Status: AC
  Administered 2017-09-08: 40 meq
  Filled 2017-09-08: qty 30

## 2017-09-08 MED ORDER — ALBUTEROL SULFATE (2.5 MG/3ML) 0.083% IN NEBU: 2.5000 mg | INHALATION_SOLUTION | Freq: Three times a day (TID) | RESPIRATORY_TRACT | 12 refills | Status: DC

## 2017-09-08 MED ORDER — POTASSIUM CHLORIDE CRYS ER 20 MEQ PO TBCR: 40.0000 meq | EXTENDED_RELEASE_TABLET | ORAL | 0 refills | Status: DC

## 2017-09-08 MED ORDER — PRO-STAT SUGAR FREE PO LIQD: 30.0000 mL | Freq: Three times a day (TID) | ORAL | 0 refills | Status: DC

## 2017-09-08 MED ORDER — DOCUSATE SODIUM 50 MG/5ML PO LIQD: 100.0000 mg | Freq: Two times a day (BID) | ORAL | 0 refills | Status: DC

## 2017-09-08 NOTE — Progress Notes (Addendum)
Physical Therapy Treatment Patient Details Name: Cassie [name] MRN: [id number] DOB: 1966/01/03 Today's Date: 09/08/2017    History of Present Illness Pt adm with ARDS due to influenza A. Intubated on 1/25. Extubated on 2/7 but reintubated. Trached 2/8. Currently on trach collar. PMH - hypthyroidism, obesity    PT Comments    Patient is making progress toward mobility goals and eager to return to independent. Pt's friend present and very supportive. Current plan remains appropriate.    Follow Up Recommendations  CIR     Equipment Recommendations  Other (comment)(To be determined)    Recommendations for Other Services       Precautions / Restrictions Precautions Precautions: Fall Restrictions Weight Bearing Restrictions: No    Mobility  Bed Mobility Overal bed mobility: Needs Assistance Bed Mobility: Supine to Sit     Supine to sit: HOB elevated;Min guard     General bed mobility comments: min guard for safety  Transfers Overall transfer level: Needs assistance Equipment used: Rolling walker (2 wheeled) Transfers: Sit to/from Stand Sit to Stand: Min assist         General transfer comment: assist to steady upon standing; 2 attempts to stand prior to achieving standing  Ambulation/Gait Ambulation/Gait assistance: Min assist Ambulation Distance (Feet): 150 Feet Assistive device: Rolling walker (2 wheeled) Gait Pattern/deviations: Step-through pattern;Decreased stride length;Drifts right/left Gait velocity: decr   General Gait Details: assist for balance and managing RW at times especially with turning; pt with increased unsteadiness with horizontal head turns   Financial trader Rankin (Stroke Patients Only)       Balance Overall balance assessment: Needs assistance Sitting-balance support: No upper extremity supported;Feet supported Sitting balance-Leahy Scale: Good     Standing balance support: Single  extremity supported;During functional activity Standing balance-Leahy Scale: Poor                              Cognition Arousal/Alertness: Awake/alert Behavior During Therapy: WFL for tasks assessed/performed Overall Cognitive Status: Difficult to assess                                 General Comments: Appears intact      Exercises      General Comments General comments (skin integrity, edema, etc.): SpO2 97% or > on 28% trach collar      Pertinent Vitals/Pain Pain Assessment: No/denies pain    Home Living                      Prior Function            PT Goals (current goals can now be found in the care plan section) Acute Rehab PT Goals PT Goal Formulation: With patient Time For Goal Achievement: 09/20/17 Potential to Achieve Goals: Good    Frequency    Min 3X/week      PT Plan Current plan remains appropriate    Co-evaluation              AM-PAC PT "6 Clicks" Daily Activity  Outcome Measure  Difficulty turning over in bed (including adjusting bedclothes, sheets and blankets)?: A Little Difficulty moving from lying on back to sitting on the side of the bed? : A Little Difficulty sitting down on and standing up from a chair with  arms (e.g., wheelchair, bedside commode, etc,.)?: Unable Help needed moving to and from a bed to chair (including a wheelchair)?: A Little Help needed walking in hospital room?: A Little Help needed climbing 3-5 steps with a railing? : A Lot 6 Click Score: 15    End of Session Equipment Utilized During Treatment: Gait belt;Oxygen Activity Tolerance: Patient tolerated treatment well Patient left: in chair;with call bell/phone within reach;with family/visitor present Nurse Communication: Mobility status PT Visit Diagnosis: Unsteadiness on feet (R26.81);Muscle weakness (generalized) (M62.81);Difficulty in walking, not elsewhere classified (R26.2)     Time: 5208-0223 PT Time  Calculation (min) (ACUTE ONLY): 35 min  Charges:  $Gait Training: 23-37 mins                    G Codes:       Earney Navy, PTA Pager: [phone]     Darliss Cheney 09/08/2017, 4:10 PM

## 2017-09-08 NOTE — Progress Notes (Signed)
Modified Barium Swallow Progress Note  Patient Details  Name: Cassie [name] MRN: [id number] Date of Birth: 01-20-1966  Today's Date: 09/08/2017  Modified Barium Swallow completed.  Full report located under Chart Review in the Imaging Section.  Brief recommendations include the following:  Clinical Impression  Pt's swallow function has improved from prior study on 09/04/17 with pt PMV placed. Mildly decreased timing and coordination of swallow resulted in flash penetration with thin and episode of trace aspiration (mild cough) with nectar with larger sip. Verbal cues for smaller sips nectar were effective with adequate pharyngeal strength without residual. Oral manipulation and transit functional with regular texture. Swallow function was also assessed without PMV in the event she will need to eat without valve and able to protect airway. Given decreased endurance and deconditioning will initiate a Dys 3 texture, nectar thick liquids, place PMV with meals/meds, pills whole in applesauce, no straws and continued ST.   Swallow Evaluation Recommendations       SLP Diet Recommendations: Dysphagia 3 (Mech soft) solids;Nectar thick liquid   Liquid Administration via: Cup;No straw   Medication Administration: Whole meds with puree   Supervision: Patient able to self feed;Full supervision/cueing for compensatory strategies   Compensations: Slow rate;Small sips/bites   Postural Changes: Seated upright at 90 degrees   Oral Care Recommendations: Oral care BID        Houston Siren 09/08/2017,4:07 PM  Orbie Pyo Colvin Caroli.Ed [employer] [phone]

## 2017-09-08 NOTE — Progress Notes (Signed)
I met with pt at bedside and then with her permission, contacted her friend, Merrily Pew, to discuss goals and expectations of an inpt rehab admit. They are both in agreement.Merrily Pew states she was in the process of obtaining medical insurance pta. He will clarify if she does or does not have insurance and follow up with me by tomorrow morning. We can admit pt without insurance, but must seek authorization with insurance prior to admit if she does indeed have it. I will follow up in the morning. [phone]

## 2017-09-08 NOTE — Progress Notes (Signed)
  Speech Language Pathology Treatment: Nada Boozer Speaking valve  Patient Details Name: Ronan Dion MRN: [id number] DOB: Mar 28, 1966 Today's Date: 09/08/2017 Time: 7096-2836 SLP Time Calculation (min) (ACUTE ONLY): 23 min  Assessment / Plan / Recommendation Clinical Impression  Similar response with speaking valve as yesterday's session in regards to vocal quality. Less time required for air to consistently redirect and flow to upper airway and donned for approximately 17 minutes. Quality of verbalizations resembled use of false cords rather than true with a hoarse whisper-like characteristic. All vitals were within normal limits. Verbal cues and demonstration provided for use of deep inhalation to improve quality; suspect edema. Continue to wear valve with SLP only until she can donn at outset of session and increase minutes of use.   Will proceed with MBS today to assess ability to initiate po's evaluating with and without PMV. Scheduled for 12:30 today.     HPI HPI: 52 y/o female with minimal past medical history admitted with ARDS from Influenza A. Received Tamiflu, currently enrolled in a phase 2 clinical trial for an influenza investigational agent. She was intubated for airway support and respiratory distress (ARDS) on 1/25. Gradual improvement over the following several days. Attempted extubation on 2/7 but patient had significant stridor and respiratory distress so she was re-intubated shortly after. Tracheostomy placed 2/8.      SLP Plan  MBS       Recommendations  Diet recommendations: NPO      Patient may use Passy-Muir Speech Valve: with SLP only PMSV Supervision: Full MD: Please consider changing trach tube to : Smaller size         General recommendations: Rehab consult Oral Care Recommendations: Oral care QID Follow up Recommendations: Inpatient Rehab SLP Visit Diagnosis: Aphonia (R49.1) Plan: MBS                       Houston Siren 09/08/2017, 10:37 AM  Orbie Pyo Colvin Caroli.[employer] [phone]

## 2017-09-08 NOTE — Progress Notes (Signed)
PULMONARY / CRITICAL CARE MEDICINE   Name: Cassie [name] MRN: [id number] DOB: 26-Apr-1966    ADMISSION DATE:  08/17/2017 CONSULTATION DATE:  08/19/2017  REFERRING MD:  Carolin Sicks  CHIEF COMPLAINT:  Dyspnea  HISTORY OF PRESENT ILLNESS:   52 y/o female with minimal past medical history admitted with ARDS from Influenza A. Received Tamiflu, currently enrolled in a phase 2 clinical trial for an influenza investigational agent. She was intubated for airway support and respiratory distress (ARDS) on 1/25. Gradual improvement over the following several days. Attempted extubation on 2/7 but patient had significant stridor and respiratory distress so she was re-intubated shortly after. Tracheostomy placed 2/8.  SUBJECTIVE:  No events overnight, tolerated trach change well.  VITAL SIGNS: BP (!) 156/101 (BP Location: Left Arm)   Pulse 100   Temp 98.1 F (36.7 C) (Oral)   Resp (!) 21   Ht 5\' 4"  (1.626 m)   Wt 210 lb 12.2 oz (95.6 kg)   LMP 08/17/2017 (Exact Date)   SpO2 95%   BMI 36.18 kg/m   HEMODYNAMICS:    VENTILATOR SETTINGS: FiO2 (%):  [35 %] 35 %  INTAKE / OUTPUT: I/O last 3 completed shifts: In: 6553 [NG/GT:1170] Out: 34 [Urine:650]  PHYSICAL EXAMINATION General: Obese female with trach post ARDS that was in the ICU, off vent Neurological: Alert and oriented, moving all ext to command HEENT: [place]/AT, PERRL, EOM-I and MMM Cardiovascular: RRR, Nl S1/S2 and -M/R/G. Pulmonary: CTA bilaterally GI:  Soft, NT, ND and +BS Musculoskeletal: SCD's in place, intact Skin: no rash or ecchymosis observed  LABS: BMET Recent Labs  Lab 09/06/17 0504 09/07/17 0426 09/08/17 0500  NA 154* 150* 142  K 3.0* 3.2* 2.9*  CL 112* 113* 104  CO2 26 25 25   BUN 36* 32* 23*  CREATININE 0.78 0.67 0.57  GLUCOSE 181* 143* 161*   Electrolytes Recent Labs  Lab 09/02/17 0400  09/04/17 1011 09/05/17 0552 09/06/17 0504 09/07/17 0426 09/08/17 0500  CALCIUM 9.9   < > 10.1 10.2 9.8 9.5 9.1   MG 1.9  --  2.0 2.1  --   --   --   PHOS 3.9  --  3.5 3.1  --   --   --    < > = values in this interval not displayed.   CBC Recent Labs  Lab 09/04/17 1011 09/05/17 0552 09/06/17 0504  WBC 9.2 8.3 9.8  HGB 10.1* 10.0* 10.8*  HCT 34.4* 34.4* 36.6  PLT 445* 374 410*   Coag's Recent Labs  Lab 09/01/17 1327 09/02/17 0757  APTT 33  --   INR 1.19 1.23   Sepsis Markers No results for input(s): LATICACIDVEN, PROCALCITON, O2SATVEN in the last 168 hours. ABG No results for input(s): PHART, PCO2ART, PO2ART in the last 168 hours. Liver Enzymes Recent Labs  Lab 09/01/17 1327  AST 32  ALT 46  ALKPHOS 100  BILITOT 0.7  ALBUMIN 2.7*   Cardiac Enzymes No results for input(s): TROPONINI, PROBNP in the last 168 hours.  Glucose Recent Labs  Lab 09/07/17 1235 09/07/17 1628 09/07/17 2003 09/07/17 2331 09/08/17 0521 09/08/17 0805  GLUCAP 133* 147* 109* 161* 160* 120*   Imaging No results found.   STUDIES:  1/25 Echo: LVEF 55-60%, Grade 2 DD, mild left atrial dilation  CULTURES: 1/26 resp > Influenza A positive 1/23 blood > NEG  1/23 RSVP > positive for FLU A H1 2009  ANTIBIOTICS: 1/26 vanc >>> 1/27 1/23 azithro >>> 1/27 1/23 ceftriaxone >>> 1/25 1/26 meropenem >>>  2/2 1/23 tamiflu >>>1/29 1/29 Linezolid >>>2/2  SIGNIFICANT EVENTS: 1/23 admission 1/25 intubation 2/7 extubated, re-intubated 2/8 trach placed  LINES/TUBES: 1/25 ETT >>> 2/7 1/25 R IJ CVL>>> (removed) 1/25 L radial arterial line >>> 1/25 2/4 R PICC >>> 2/7 ETT >> 2/8 2/8 trach (changed to cuffless 6 on 2/13)  I reviewed CXR myself, trach is in good position  DISCUSSION: 52 y/o female with ARDS from severe community acquired pneumonia Influenza A.   ASSESSMENT / PLAN:  ARDS secondary to influenza A - improved: long ICU course on vent. She is status post ABX and tamiflu. Failed extubation due to stridor and upper airway obstruction requiring re-intubation and tracheostomy. She is now  tolerating 28% FiO2 via ATC. She is unable to move air around trach with finger occlusion.  - Plan continue supplemental O2 PRN for sats > 92%, ok to wean to room air if her O2 sat will tolerate.  - Hopeful she can rapidly move towards decannulation.   Trach status: - Changed to cuffless 6 on 2/13, on 2/15 change to a cuffless 4 and if tolerated over the weekend then cap on Monday morning and overnight, if no issues while capped overnight then may decannulate on Tuesday.  Hypoxemia: - Titrate O2 for sat of 88-92% - When capped insure R[place] is in place  Hypokalemia: - KCl PO ordered  PCCM will see again on Monday.  Plan discussed with RT and delineated above.  Rush Farmer, M.D. [facility] Pulmonary/Critical Care Medicine. Pager: [phone]. After hours pager: [phone]

## 2017-09-08 NOTE — Progress Notes (Signed)
Per MD request. Pulmonary CCMD called to see pt. Before pt. Transferred to inpatient rehab. Will come to see pt later this afternoon per PA.

## 2017-09-08 NOTE — H&P (Signed)
Physical Medicine and Rehabilitation Admission H&P    Chief Complaint  Patient presents with  . Pneumonia  : HPI: Cassie [name] is a 52 y.o. right handed  female with history of hypothyroid, obesity, recent travel. Per chart review patient lives with spouse. Independent prior to admission.   Admitted on 08/17/17 with acute hypoxic respiratory failure due to severe influenza A superimposed on  pneumonia. History taken from chart review. She was treated with IVIG X 2 v/s placebo for influenza A phase two study. She continued to have fevers and was treated with antibiotics, Tamiflu as well as IV lasix but developed ARDS with septic shock requiring intubation. She had difficulty with extubation attempts due to stridor requiring tracheostomy on 2/8. She was extubated to ATC and downsized to #4 Cuffless 09/09/2017 with and then to cap on Monday, 09/12/2017 and consider decannulation 09/13/2017.  Diet has been advanced to dysphagia #3 nectar liquids. Subcutaneous Lovenox for DVT prophylaxis.  PT and OT evaluations completed and CIR recommended due to debility. Patient was admitted for a comprehensive rehabilitation program    Review of Systems  Constitutional: Positive for fever.  HENT: Negative for hearing loss.   Eyes: Negative for blurred vision.  Respiratory: Positive for cough and shortness of breath.   Cardiovascular: Positive for leg swelling.  Gastrointestinal: Positive for constipation. Negative for nausea and vomiting.  Genitourinary: Negative for dysuria, flank pain and hematuria.  Musculoskeletal: Positive for myalgias.  Skin: Negative for rash.  Neurological: Positive for headaches. Negative for seizures.  All other systems reviewed and are negative.  Past Medical History:  Diagnosis Date  . Hypothyroidism    Past Surgical History:  Procedure Laterality Date  . DENTAL SURGERY     Family History  Problem Relation Age of Onset  . Kidney disease Father    Social  History:  reports that  has never smoked. she has never used smokeless tobacco. She reports that she does not drink alcohol or use drugs. Allergies:  Allergies  Allergen Reactions  . Sulfa Antibiotics    Medications Prior to Admission  Medication Sig Dispense Refill  . Aspirin-Salicylamide-Caffeine (BC HEADACHE POWDER PO) Take 1 Package by mouth as needed (pain).    . Diphenhydramine-PE-APAP (DELSYM COUGH/COLD NIGHT TIME PO) Take 5 mLs by mouth as needed (cough).    . Ginger Root POWD Take 2.5 mLs by mouth daily. Dissolve in tea    . Kava, Piper methysticum, (KAVA KAVA ROOT PO) Take 2.5 mLs by mouth daily. Dissolve in tea    . pseudoephedrine (SUDAFED) 120 MG 12 hr tablet Take 120 mg by mouth daily as needed for congestion.    Marland Kitchen thyroid (ARMOUR) 90 MG tablet Take 90 mg by mouth daily.    . Turmeric 400 MG CAPS Take by mouth.      Drug Regimen Review Drug regimen was reviewed and remains appropriate with no significant issues identified  Home: Home Living Family/patient expects to be discharged to:: Private residence Living Arrangements: Spouse/significant other Available Help at Discharge: Family Type of Home: House Home Layout: Two level Alternate Level Stairs-Number of Steps: flight Bathroom Shower/Tub: Chiropodist: Standard Home Equipment: None Additional Comments: Difficult to get specifics due to trach   Functional History: Prior Function Level of Independence: Independent  Functional Status:  Mobility: Bed Mobility Overal bed mobility: Needs Assistance Bed Mobility: Supine to Sit Supine to sit: Min assist, HOB elevated General bed mobility comments: Assist to elevate trunk into sitting. Transfers Overall transfer  level: Needs assistance Equipment used: None Transfers: Sit to/from Stand, Stand Pivot Transfers Sit to Stand: Mod assist Stand pivot transfers: Mod assist General transfer comment: Assist for balance and to bring hips up. Used HHA  to take pivotal steps bed to recliner Ambulation/Gait Ambulation/Gait assistance: Min assist, +2 physical assistance Ambulation Distance (Feet): 60 Feet Assistive device: Rolling walker (2 wheeled) Gait Pattern/deviations: Step-through pattern, Decreased stride length, Trunk flexed, Wide base of support General Gait Details: Assist for balance and support. Amb on 28% trach collar with SpO2 >90% and HR to 140's.  Gait velocity: decr Gait velocity interpretation: Below normal speed for age/gender    ADL: ADL Overall ADL's : Needs assistance/impaired Eating/Feeding: NPO Grooming: Set up, Sitting Upper Body Bathing: Moderate assistance, Sitting Lower Body Bathing: Moderate assistance, Sit to/from stand Upper Body Dressing : Moderate assistance Lower Body Dressing: Moderate assistance, Sit to/from stand Toilet Transfer: Moderate assistance, Buyer, retail Details (indicate cue type and reason): mod A for initial boost, assist for balance with pivotal steps to recliner Toileting- Clothing Manipulation and Hygiene: Moderate assistance, Sit to/from stand Functional mobility during ADLs: Moderate assistance General ADL Comments: HHA for balance only  Cognition: Cognition Overall Cognitive Status: Difficult to assess Orientation Level: Oriented X4 Cognition Arousal/Alertness: Awake/alert Behavior During Therapy: WFL for tasks assessed/performed Overall Cognitive Status: Difficult to assess General Comments: Appears intact Difficult to assess due to: Tracheostomy  Physical Exam: Blood pressure (!) 156/101, pulse 100, temperature 98.1 F (36.7 C), temperature source Oral, resp. rate (!) 21, height _0  (1.626 m), weight 95.6 kg (210 lb 12.2 oz), last menstrual period 08/17/2017, SpO2 95 %. Physical Exam  Vitals reviewed. HENT:  Head: Normocephalic.  Eyes: EOM are normal. Right eye exhibits no discharge. Left eye exhibits no discharge.  Neck:  Trach tube in place. #4  with PMV.  Poor phonation  Cardiovascular: Normal rate and regular rhythm. Exam reveals no friction rub.  No murmur heard. Respiratory: No respiratory distress. She has no wheezes. She has no rales.  Fair inspiratory effort without wheeze  GI: Soft. Bowel sounds are normal. She exhibits no distension.  Musculoskeletal: She exhibits no edema, tenderness or deformity.  Psychiatric: She has a normal mood and affect. Her behavior is normal. Thought content normal.  Skin. Warm and dry Musculoskeletal:  No edema or tenderness in extremities  Neurological: She is alert.  Motor: 4/5 UE prox to distal. LE: 3/5 HF and KE and 4/5 prox to distal. No sensory findings. Normal cognition, DTR's 1+, sensation normal Psych: pleasant and appropriate    Results for orders placed or performed during the hospital encounter of 08/17/17 (from the past 48 hour(s))  Glucose, capillary     Status: Abnormal   Collection Time: 09/06/17  4:28 PM  Result Value Ref Range   Glucose-Capillary 143 (H) 65 - 99 mg/dL   Comment 1 Notify RN    Comment 2 Document in Chart   Glucose, capillary     Status: Abnormal   Collection Time: 09/06/17  7:54 PM  Result Value Ref Range   Glucose-Capillary 149 (H) 65 - 99 mg/dL   Comment 1 Notify RN    Comment 2 Document in Chart   Glucose, capillary     Status: Abnormal   Collection Time: 09/06/17 11:34 PM  Result Value Ref Range   Glucose-Capillary 152 (H) 65 - 99 mg/dL   Comment 1 Notify RN    Comment 2 Document in Chart   Glucose, capillary  Status: Abnormal   Collection Time: 09/07/17  3:38 AM  Result Value Ref Range   Glucose-Capillary 146 (H) 65 - 99 mg/dL   Comment 1 Notify RN    Comment 2 Document in Chart   Basic metabolic panel     Status: Abnormal   Collection Time: 09/07/17  4:26 AM  Result Value Ref Range   Sodium 150 (H) 135 - 145 mmol/L   Potassium 3.2 (L) 3.5 - 5.1 mmol/L   Chloride 113 (H) 101 - 111 mmol/L   CO2 25 22 - 32 mmol/L   Glucose, Bld 143  (H) 65 - 99 mg/dL   BUN 32 (H) 6 - 20 mg/dL   Creatinine, Ser 0.67 0.44 - 1.00 mg/dL   Calcium 9.5 8.9 - 10.3 mg/dL   GFR calc non Af Amer >60 >60 mL/min   GFR calc Af Amer >60 >60 mL/min    Comment: (NOTE) The eGFR has been calculated using the CKD EPI equation. This calculation has not been validated in all clinical situations. eGFR's persistently <60 mL/min signify possible Chronic Kidney Disease.    Anion gap 12 5 - 15    Comment: Performed at [facility] [address] [place], [place] [address]  Glucose, capillary     Status: Abnormal   Collection Time: 09/07/17  8:31 AM  Result Value Ref Range   Glucose-Capillary 145 (H) 65 - 99 mg/dL   Comment 1 Notify RN    Comment 2 Document in Chart   Glucose, capillary     Status: Abnormal   Collection Time: 09/07/17 12:35 PM  Result Value Ref Range   Glucose-Capillary 133 (H) 65 - 99 mg/dL   Comment 1 Notify RN    Comment 2 Document in Chart   Glucose, capillary     Status: Abnormal   Collection Time: 09/07/17  4:28 PM  Result Value Ref Range   Glucose-Capillary 147 (H) 65 - 99 mg/dL  Glucose, capillary     Status: Abnormal   Collection Time: 09/07/17  8:03 PM  Result Value Ref Range   Glucose-Capillary 109 (H) 65 - 99 mg/dL   Comment 1 Notify RN    Comment 2 Document in Chart   Glucose, capillary     Status: Abnormal   Collection Time: 09/07/17 11:31 PM  Result Value Ref Range   Glucose-Capillary 161 (H) 65 - 99 mg/dL   Comment 1 Notify RN    Comment 2 Document in Chart   Basic metabolic panel     Status: Abnormal   Collection Time: 09/08/17  5:00 AM  Result Value Ref Range   Sodium 142 135 - 145 mmol/L   Potassium 2.9 (L) 3.5 - 5.1 mmol/L   Chloride 104 101 - 111 mmol/L   CO2 25 22 - 32 mmol/L   Glucose, Bld 161 (H) 65 - 99 mg/dL   BUN 23 (H) 6 - 20 mg/dL   Creatinine, Ser 0.57 0.44 - 1.00 mg/dL   Calcium 9.1 8.9 - 10.3 mg/dL   GFR calc non Af Amer >60 >60 mL/min   GFR calc Af Amer >60 >60 mL/min     Comment: (NOTE) The eGFR has been calculated using the CKD EPI equation. This calculation has not been validated in all clinical situations. eGFR's persistently <60 mL/min signify possible Chronic Kidney Disease.    Anion gap 13 5 - 15    Comment: Performed at [facility] [address], [place], [place] [address]  Glucose,  capillary     Status: Abnormal   Collection Time: 09/08/17  5:21 AM  Result Value Ref Range   Glucose-Capillary 160 (H) 65 - 99 mg/dL  Glucose, capillary     Status: Abnormal   Collection Time: 09/08/17  8:05 AM  Result Value Ref Range   Glucose-Capillary 120 (H) 65 - 99 mg/dL   Comment 1 Notify RN    Comment 2 Document in Chart   Glucose, capillary     Status: Abnormal   Collection Time: 09/08/17 12:23 PM  Result Value Ref Range   Glucose-Capillary 144 (H) 65 - 99 mg/dL   Comment 1 Notify RN    Comment 2 Document in Chart    No results found.     Medical Problem List and Plan: 1.  Debility secondary to ARDS/VDRF related to influenza A   -admit to inpatient rehab 2.  DVT Prophylaxis/Anticoagulation: Subcutaneous Lovenox. Check vascular study 3. Pain Management: Tylenol as needed 4. Mood: Provide emotional support 5. Neuropsych: This patient is capable of making decisions on her own behalf. 6. Skin/Wound Care: Routine skin checks 7. Fluids/Electrolytes/Nutrition: Routine I&O's with follow-up chemistries upon admit  -remove PICC 8. Tracheostomy 09/02/2017. Presently with #4 CUFFLESS 09/09/2017. Plan to cap if tolerated 09/12/2017 and then consider decannulation 9. Dysphagia. Dysphagia #3 nectar thick liquids. Follow-up speech therapy. Tube feeds can be discontinued and monitor nutritional intake, remove NGT 10. Hypertension. Norvasc 10 mg daily 11. Anemia due to critical illness. Follow-up CBC   Post Admission Physician Evaluation: 1. Functional deficits secondary  to debility. 2. Patient is admitted to receive collaborative, interdisciplinary  care between the physiatrist, rehab nursing staff, and therapy team. 3. Patient's level of medical complexity and substantial therapy needs in context of that medical necessity cannot be provided at a lesser intensity of care such as a SNF. 4. Patient has experienced substantial functional loss from his/her baseline which was documented above under the "Functional History" and "Functional Status" headings.  Judging by the patient's diagnosis, physical exam, and functional history, the patient has potential for functional progress which will result in measurable gains while on inpatient rehab.  These gains will be of substantial and practical use upon discharge  in facilitating mobility and self-care at the household level. 5. Physiatrist will provide 24 hour management of medical needs as well as oversight of the therapy plan/treatment and provide guidance as appropriate regarding the interaction of the two. 6. The Preadmission Screening has been reviewed and patient status is unchanged unless otherwise stated above. 7. 24 hour rehab nursing will assist with bladder management, bowel management, safety, skin/wound care, disease management, medication administration, pain management and patient education  and help integrate therapy concepts, techniques,education, etc. 8. PT will assess and treat for/with: Lower extremity strength, range of motion, stamina, balance, functional mobility, safety, adaptive techniques and equipment, activity tolerance, family ed.   Goals are: mod I to supervision. 9. OT will assess and treat for/with: ADL's, functional mobility, safety, upper extremity strength, adaptive techniques and equipment, family ed, activity tolerance.   Goals are: mod I to supervision. Therapy may proceed with showering this patient. 10. SLP will assess and treat for/with: speech/phonation, swallowing.  Goals are: mod I. 11. Case Management and Social Worker will assess and treat for psychological issues  and discharge planning. 12. Team conference will be held weekly to assess progress toward goals and to determine barriers to discharge. 13. Patient will receive at least 3 hours of therapy per day at least 5 days  per week. 14. ELOS: 10-14 days       15. Prognosis:  excellent     Meredith Staggers, MD, [facility] 09/10/2017  Lavon Paganini [place], PA-C 09/08/2017

## 2017-09-08 NOTE — Progress Notes (Signed)
PROGRESS NOTE  Cassie [name] VQQ:[id number] DOB: January 14, 1966 DOA: 08/17/2017 PCP: Patient, No Pcp Per  HPI/Recap of past 24 hours:  Cassie [name] is a 52 y.o. year old female with medical history significant for hypothyroidism on Armour who presented on 08/17/2017 with cough and shortness of breath and was found to have acute hypoxic respiratory failure secondary to oriented in the setting of severe influenza A pneumonia with presumed superimposed bacterial pneumonia. Has  had a long s, complicated hospital course previously requiring pressor support for septic shock and AKI. she was intubated for respiratory support on 1/25, she failed extubation due to upper airway obstruction and stridor.  Tracheostomy was placed on 2/8.  After tolerating trach collar patient was transferred to stepdown unit on 2/11.  SUBJECTIVE Mouth words and communicate that way. She denies worsening dyspnea.   Assessment/Plan: Principal Problem:   Multifocal pneumonia Active Problems:   Acute respiratory failure (HCC)   Sepsis (HCC)   Hypothyroidism   Influenza A   Acute lung injury   Transaminitis   Electrolyte imbalance   Influenza B   ARDS (adult respiratory distress syndrome) (HCC)   Hyperglycemia   Sinus tachycardia   Hypernatremia   Acute respiratory failure with hypoxemia (HCC)   Tracheostomy status (HCC)   Tachycardia   Tracheostomy in place [facility])   Oropharyngeal dysphagia   Morbid obesity (HCC)   Tachypnea   Reactive hypertension   Prediabetes   Hypokalemia   Acute blood loss anemia    Acute hypoxic respiratory failure, improving ARDS secondary to CAP and influenza A pneumonia, resolved On 5 L via trach Chest x-ray from 2/11 shows no new focal infiltrate, stable Status post completion ofTamiflu, broad-spectrum empiric antibiotics regimen on 2/2 -Pulm following for trach.  - Schedule albuterol nebs TID, as needed Xopenex -change to cuffless 6 on 2-13----on 2-14 change to scuffles  4. May de cannulate next week.   Hypovolemic hyponatremia sodium decreasing today at 150.  Likely in setting of decreased oral intake since depending on NG tube and previous IV diuresis for volume overload - Hold off on further diuresis -on Free water 250 Q 4 hours.  -improved.  Hypertension Range 166/101- 181/84 in the last 24 hours. No home blood pressure meds - As needed Hydralazine -increase Norvasc.   Sinus tachycardia Likely multifactorial etiology: Dehydration and increased thyroid hormone TSH suppressed and T4 high limit of normal Thyroid function could be related to critical illness Unclear amount of thyroid hormone in Armour -Advise discontinue Armour for now, consider Synthroid replacement deemed necessary -Free water flushes as mentioned above -needs repeat TSH and free T 4, Free T 3.  Improved.   Hypothyroidism;  Repeat TSH and free T 3 , T 4. To consider resume amodour.   Pharyngeal dysphagia N.p.o. for now Tube feeds via NG tube Speech therapy following  Hyperglycemia No history of diabetes -Obtain A1c -Continue sliding scale insulin, monitor blood glucose levels  Anemia Likely due to critical illness Stable at 10 No signs or symptoms of bleeding -Monitor on CBC  Hypokalemia;  Replete orally.   Septic shock, resolved Previously required pressors during day NICU.  For some time   Code Status: Full code  Family Communication: no family at bedside.   Disposition Plan: CIR when ok By CCM   Consultants:  PCCM  Procedures:  1/25 Echo: LVEF 55-60%, Grade 2 DD, mild left atrial dilation    ANTIBIOTICS: 1/26 vanc >>> 1/27 1/23 azithro >>> 1/27 1/23 ceftriaxone >>> 1/25 1/26 meropenem >>>  2/2 1/23 tamiflu >>>1/29 1/29 Linezolid >>>2/2   Cultures: 1/26 resp > Influenza A positive 1/23 blood >  1/23 RSVP > positive for FLU A H1 2009    DVT prophylaxis: Lovenox   Objective: Vitals:   09/08/17 0335 09/08/17 0500 09/08/17 0805  09/08/17 1112  BP:      Pulse:  89 95 100  Resp:   (!) 22 (!) 21  Temp:      TempSrc:      SpO2:   98% 95%  Weight: 95.6 kg (210 lb 12.2 oz)     Height:        Intake/Output Summary (Last 24 hours) at 09/08/2017 1258 Last data filed at 09/07/2017 1700 Gross per 24 hour  Intake 390 ml  Output 300 ml  Net 90 ml   Filed Weights   09/06/17 0533 09/07/17 0504 09/08/17 0335  Weight: 95.6 kg (210 lb 12.2 oz) 95.2 kg (209 lb 14.1 oz) 95.6 kg (210 lb 12.2 oz)    Exam:  General; NAD HEENT-NG tube in place.  Cardiovascular-S 1, S 2 RRR Respiratory- Bilateral roncus Gastrointestinal-SOft, nt, nd Extremities-no edema Neuro-alert  Data Reviewed: CBC: Recent Labs  Lab 09/01/17 1327 09/02/17 0400 09/04/17 1011 09/05/17 0552 09/06/17 0504  WBC 12.9* 12.7* 9.2 8.3 9.8  NEUTROABS 10.9*  --   --   --   --   HGB 9.2* 8.9* 10.1* 10.0* 10.8*  HCT 31.2* 30.0* 34.4* 34.4* 36.6  MCV 79.8 79.6 81.7 80.8 81.2  PLT 365 375 445* 374 147*   Basic Metabolic Panel: Recent Labs  Lab 09/01/17 1807 09/02/17 0300 09/02/17 0400  09/04/17 1011 09/05/17 0552 09/06/17 0504 09/07/17 0426 09/08/17 0500  NA  --   --  144   < > 150* 152* 154* 150* 142  K  --   --  3.1*   < > 3.5 3.4* 3.0* 3.2* 2.9*  CL  --   --  100*   < > 108 112* 112* 113* 104  CO2  --   --  29   < > 29 27 26 25 25   GLUCOSE  --   --  159*   < > 142* 181* 181* 143* 161*  BUN  --   --  42*   < > 32* 32* 36* 32* 23*  CREATININE  --   --  0.80   < > 0.67 0.83 0.78 0.67 0.57  CALCIUM  --   --  9.9   < > 10.1 10.2 9.8 9.5 9.1  MG 2.2 2.0 1.9  --  2.0 2.1  --   --   --   PHOS 3.7 4.0 3.9  --  3.5 3.1  --   --   --    < > = values in this interval not displayed.   GFR: Estimated Creatinine Clearance: 92.3 mL/min (by C-G formula based on SCr of 0.57 mg/dL). Liver Function Tests: Recent Labs  Lab 09/01/17 1327  AST 32  ALT 46  ALKPHOS 100  BILITOT 0.7  PROT 7.1  ALBUMIN 2.7*   No results for input(s): LIPASE, AMYLASE in  the last 168 hours. No results for input(s): AMMONIA in the last 168 hours. Coagulation Profile: Recent Labs  Lab 09/01/17 1327 09/02/17 0757  INR 1.19 1.23   Cardiac Enzymes: No results for input(s): CKTOTAL, CKMB, CKMBINDEX, TROPONINI in the last 168 hours. BNP (last 3 results) No results for input(s): PROBNP in the last 8760 hours. HbA1C: Recent Labs  09/06/17 0504  HGBA1C 5.9*   CBG: Recent Labs  Lab 09/07/17 2003 09/07/17 2331 09/08/17 0521 09/08/17 0805 09/08/17 1223  GLUCAP 109* 161* 160* 120* 144*   Lipid Profile: No results for input(s): CHOL, HDL, LDLCALC, TRIG, CHOLHDL, LDLDIRECT in the last 72 hours. Thyroid Function Tests: No results for input(s): TSH, T4TOTAL, FREET4, T3FREE, THYROIDAB in the last 72 hours. Anemia Panel: No results for input(s): VITAMINB12, FOLATE, FERRITIN, TIBC, IRON, RETICCTPCT in the last 72 hours. Urine analysis:    Component Value Date/Time   COLORURINE YELLOW 08/20/2017 1019   APPEARANCEUR TURBID (A) 08/20/2017 1019   LABSPEC 1.029 08/20/2017 1019   PHURINE 5.0 08/20/2017 1019   GLUCOSEU 50 (A) 08/20/2017 1019   HGBUR SMALL (A) 08/20/2017 1019   BILIRUBINUR NEGATIVE 08/20/2017 1019   KETONESUR 5 (A) 08/20/2017 1019   PROTEINUR 100 (A) 08/20/2017 1019   NITRITE NEGATIVE 08/20/2017 1019   LEUKOCYTESUR NEGATIVE 08/20/2017 1019   Sepsis Labs: @LABRCNTIP (procalcitonin:4,lacticidven:4)  )No results found for this or any previous visit (from the past 240 hour(s)).    Studies: No results found.  Scheduled Meds: . albuterol  2.5 mg Nebulization TID  . amLODipine  10 mg Oral Daily  . bisacodyl  10 mg Rectal Daily  . docusate  100 mg Per Tube BID  . enoxaparin (LOVENOX) injection  50 mg Subcutaneous Q24H  . feeding supplement (JEVITY 1.5 CAL/FIBER)  1,000 mL Per Tube Q24H  . feeding supplement (PRO-STAT SUGAR FREE 64)  30 mL Oral TID  . free water  250 mL Per Tube Q4H  . insulin aspart  0-15 Units Subcutaneous Q4H  .  mouth rinse  15 mL Mouth Rinse QID  . multivitamin  15 mL Oral Daily  . sodium chloride flush  10-40 mL Intracatheter Q12H    Continuous Infusions: . sodium chloride 10 mL/hr at 09/03/17 0700     LOS: 21 days     Elmarie Shiley, MD [facility] Pager [phone]  If 7PM-7AM, please contact night-coverage [email or web address] Password [facility] 09/08/2017, 12:58 PM

## 2017-09-09 LAB — GLUCOSE, CAPILLARY
GLUCOSE-CAPILLARY: 126 mg/dL — AB (ref 65–99)
GLUCOSE-CAPILLARY: 127 mg/dL — AB (ref 65–99)
GLUCOSE-CAPILLARY: 141 mg/dL — AB (ref 65–99)
GLUCOSE-CAPILLARY: 150 mg/dL — AB (ref 65–99)
Glucose-Capillary: 144 mg/dL — ABNORMAL HIGH (ref 65–99)
Glucose-Capillary: 133 mg/dL — ABNORMAL HIGH (ref 65–99)
Glucose-Capillary: 112 mg/dL — ABNORMAL HIGH (ref 65–99)

## 2017-09-09 LAB — BASIC METABOLIC PANEL
Anion gap: 9 (ref 5–15)
BUN: 17 mg/dL (ref 6–20)
CALCIUM: 9.3 mg/dL (ref 8.9–10.3)
CO2: 26 mmol/L (ref 22–32)
CREATININE: 0.58 mg/dL (ref 0.44–1.00)
Chloride: 106 mmol/L (ref 101–111)
GFR calc Af Amer: >60 mL/min (ref >60)
Glucose, Bld: 138 mg/dL — ABNORMAL HIGH (ref 65–99)
Potassium: 3 mmol/L — ABNORMAL LOW (ref 3.5–5.1)
SODIUM: 141 mmol/L (ref 135–145)

## 2017-09-09 MED ORDER — POTASSIUM CHLORIDE 20 MEQ PO PACK
40.0000 meq | PACK | Freq: Once | ORAL | Status: DC
  Filled 2017-09-09: qty 2

## 2017-09-09 MED ORDER — HYDRALAZINE HCL 50 MG PO TABS: 25.0000 mg | ORAL_TABLET | Freq: Two times a day (BID) | ORAL | Status: DC

## 2017-09-09 MED ORDER — POTASSIUM CHLORIDE CRYS ER 20 MEQ PO TBCR
40.0000 meq | EXTENDED_RELEASE_TABLET | Freq: Once | ORAL | Status: AC
  Administered 2017-09-09: 40 meq via ORAL
  Filled 2017-09-09: qty 2

## 2017-09-09 MED ORDER — PRO-STAT SUGAR FREE PO LIQD
30.0000 mL | Freq: Every day | ORAL | Status: DC
  Administered 2017-09-10: 30 mL
  Filled 2017-09-09: qty 30

## 2017-09-09 MED ORDER — JEVITY 1.5 CAL/FIBER PO LIQD
1000.0000 mL | ORAL | Status: DC
  Filled 2017-09-09 (×2): qty 1000

## 2017-09-09 NOTE — Procedures (Signed)
Tracheostomy Change Note  Patient Details:   Name: Cassie [name] DOB: 24-Aug-1965 MRN: [id number]    Airway Documentation:     Evaluation  O2 sats: stable throughout Complications: No apparent complications Patient did tolerate procedure well. Bilateral Breath Sounds: Diminished, SPO2 97% on RA    Gonzella Lex 09/09/2017, 3:50 PM

## 2017-09-09 NOTE — Progress Notes (Signed)
Physical Therapy Treatment Patient Details Name: Cassie [name] MRN: [id number] DOB: June 03, 1966 Today's Date: 09/09/2017    History of Present Illness Pt adm with ARDS due to influenza A. Intubated on 1/25. Extubated on 2/7 but reintubated. Trached 2/8. Currently on trach collar. PMH - hypthyroidism, obesity    PT Comments    Pt remains to present with functional deficits related to transfers and balance.  She remains heavily reliant on RW for balance and is extremely unsteady without device.  She continues to benefit from skilled rehab at [facility] to receive aggressive therapies before returning home.  Plan next session for DGI and high level balance activities.    Follow Up Recommendations  CIR     Equipment Recommendations  Other (comment)(TBD)    Recommendations for Other Services       Precautions / Restrictions Precautions Precautions: Fall Restrictions Weight Bearing Restrictions: No    Mobility  Bed Mobility Overal bed mobility: Needs Assistance Bed Mobility: Sit to Supine       Sit to supine: Supervision   General bed mobility comments: Pt in recliner on arrival.    Transfers Overall transfer level: Needs assistance Equipment used: Rolling walker (2 wheeled) Transfers: Sit to/from Stand Sit to Stand: Min assist Stand pivot transfers: Min assist       General transfer comment: Cues for hand palcement min assistance for safety and to steady for balance.  Ambulation/Gait Ambulation/Gait assistance: Min guard Ambulation Distance (Feet): 200 Feet Assistive device: Rolling walker (2 wheeled) Gait Pattern/deviations: Step-through pattern;Decreased stride length;Drifts right/left     General Gait Details: assist for balance and managing RW at times especially with turning; patient is tolerating activity well.     Stairs            Wheelchair Mobility    Modified Rankin (Stroke Patients Only)       Balance Overall balance assessment: Needs  assistance Sitting-balance support: No upper extremity supported;Feet supported Sitting balance-Leahy Scale: Good     Standing balance support: Single extremity supported;During functional activity Standing balance-Leahy Scale: Poor Standing balance comment: dependent on external support                            Cognition Arousal/Alertness: Awake/alert Behavior During Therapy: WFL for tasks assessed/performed Overall Cognitive Status: Difficult to assess                                 General Comments: Appears intact      Exercises      General Comments General comments (skin integrity, edema, etc.): VSS throughout      Pertinent Vitals/Pain Pain Assessment: No/denies pain Faces Pain Scale: Hurts little more Pain Intervention(s): Monitored during session    Home Living   Living Arrangements: Spouse/significant other(lives with fiance, Josh) Available Help at Discharge: Family;Available 24 hours/day(Josh avaialble as needed)   Home Access: Stairs to enter       Additional Comments: Has home in [place] and also 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 They split their time in between    Prior Function            PT Goals (current goals can now be found in the care plan section) Acute Rehab PT Goals Patient Stated Goal: get stronger and back to PLOF prior to return home Potential to Achieve Goals: Good Progress towards PT goals: Progressing toward goals    Frequency  Min 3X/week      PT Plan Current plan remains appropriate    Co-evaluation              AM-PAC PT "6 Clicks" Daily Activity  Outcome Measure  Difficulty turning over in bed (including adjusting bedclothes, sheets and blankets)?: A Little Difficulty moving from lying on back to sitting on the side of the bed? : A Little Difficulty sitting down on and standing up from a chair with arms (e.g., wheelchair, bedside commode, etc,.)?: Unable Help needed moving to  and from a bed to chair (including a wheelchair)?: A Little Help needed walking in hospital room?: A Little Help needed climbing 3-5 steps with a railing? : A Lot 6 Click Score: 15    End of Session Equipment Utilized During Treatment: Gait belt;Oxygen   Patient left: in chair;with call bell/phone within reach;with family/visitor present Nurse Communication: Mobility status PT Visit Diagnosis: Unsteadiness on feet (R26.81);Muscle weakness (generalized) (M62.81);Difficulty in walking, not elsewhere classified (R26.2)     Time: 0488-8916 PT Time Calculation (min) (ACUTE ONLY): 39 min  Charges:  $Gait Training: 23-37 mins $Therapeutic Activity: 8-22 mins                    G Codes:       Governor Rooks, PTA pager [phone]    Cristela Blue 09/09/2017, 5:05 PM

## 2017-09-09 NOTE — Progress Notes (Signed)
PROGRESS NOTE  Cassie [name] [id number]:[id number] DOB: 10-18-1965 DOA: 08/17/2017 PCP: Patient, No Pcp Per  HPI/Recap of past 24 hours:  Cassie [name] is a 52 y.o. year old female with medical history significant for hypothyroidism on Armour who presented on 08/17/2017 with cough and shortness of breath and was found to have acute hypoxic respiratory failure secondary to oriented in the setting of severe influenza A pneumonia with presumed superimposed bacterial pneumonia. Has  had a long s, complicated hospital course previously requiring pressor support for septic shock and AKI. she was intubated for respiratory support on 1/25, she failed extubation due to upper airway obstruction and stridor.  Tracheostomy was placed on 2/8.  After tolerating trach collar patient was transferred to stepdown unit on 2/11.  SUBJECTIVE Patient sitting in bed, was going to start to eat.  She denies worsening dyspnea.   Assessment/Plan: Principal Problem:   Multifocal pneumonia Active Problems:   Acute respiratory failure (HCC)   Sepsis (HCC)   Hypothyroidism   Influenza A   Acute lung injury   Transaminitis   Electrolyte imbalance   Influenza B   ARDS (adult respiratory distress syndrome) (HCC)   Hyperglycemia   Sinus tachycardia   Hypernatremia   Acute respiratory failure with hypoxemia (HCC)   Tracheostomy status (HCC)   Tachycardia   Tracheostomy in place (HCC)   Oropharyngeal dysphagia   Morbid obesity (HCC)   Tachypnea   Reactive hypertension   Prediabetes   Hypokalemia   Acute blood loss anemia    Acute hypoxic respiratory failure, improving -ARDS secondary to CAP and influenza A pneumonia, resolved -On 5 L via trach -Chest x-ray from 2/11 shows no new focal infiltrate, stable -Status post completion ofTamiflu, broad-spectrum empiric antibiotics regimen on 2/2 -Pulm following for trach.  - Chedule albuterol nebs TID, as needed Xopenex -Change to cuffless 6 on 2-13----on 2-14  change to scuffles 4. May de cannulate next week.   Hypovolemic hyponatremia sodium decreasing today at 150.  Likely in setting of decreased oral intake since depending on NG tube and previous IV diuresis for volume overload - Hold off on further diuresis -On Free water 250 Q 4 hours.  -improved.  Hypertension Range 166/101- 181/84 in the last 24 hours. No home blood pressure meds - As needed Hydralazine -increase Norvasc.   Sinus tachycardia Likely multifactorial etiology: Dehydration and increased thyroid hormone TSH suppressed and T4 high limit of normal Thyroid function could be related to critical illness Unclear amount of thyroid hormone in Armour -Advise discontinue Armour for now, consider Synthroid replacement deemed necessary -Free water flushes as mentioned above -needs repeat TSH and free T 4, Free T 3.  Improved.   Hypothyroidism;  Repeat TSH and free T 3 , T 4. To consider resume amodour.   Pharyngeal dysphagia N.p.o. for now Tube feeds via NG tube Speech therapy following  Hyperglycemia No history of diabetes -Obtain A1c -Continue sliding scale insulin, monitor blood glucose levels  Anemia Likely due to critical illness Stable at 10 No signs or symptoms of bleeding -Monitor on CBC  Hypokalemia;  Replete orally.   Septic shock, resolved Previously required pressors during day NICU.  For some time   Code Status: Full code  Family Communication: no family at bedside.   Disposition Plan: CIR when ok By CCM   Consultants:  PCCM  Procedures:  1/25 Echo: LVEF 55-60%, Grade 2 DD, mild left atrial dilation    ANTIBIOTICS: 1/26 vanc >>> 1/27 1/23 azithro >>> 1/27 1/23  ceftriaxone >>> 1/25 1/26 meropenem >>> 2/2 1/23 tamiflu >>>1/29 1/29 Linezolid >>>2/2   Cultures: 1/26 resp > Influenza A positive 1/23 blood >  1/23 RSVP > positive for FLU A H1 2009    DVT prophylaxis: Lovenox   Objective: Vitals:   09/09/17 0734 09/09/17  0819 09/09/17 1109 09/09/17 1220  BP:  (!) 148/93 (!) 166/87   Pulse: 89 100 (!) 108 100  Resp: (!) 27 17 (!) 22 (!) 24  Temp:  98.7 F (37.1 C) 99.1 F (37.3 C)   TempSrc:  Oral Oral   SpO2: 97% 99% 98% 98%  Weight:      Height:        Intake/Output Summary (Last 24 hours) at 09/09/2017 1527 Last data filed at 09/08/2017 2135 Gross per 24 hour  Intake 10 ml  Output -  Net 10 ml   Filed Weights   09/07/17 0504 09/08/17 0335 09/09/17 0414  Weight: 95.2 kg (209 lb 14.1 oz) 95.6 kg (210 lb 12.2 oz) 101.7 kg (224 lb 3.3 oz)    Exam:  General; NAD,  HEENT-NG tube in place, trach in place.  Cardiovascular- S 1, S 2 RRR Respiratory- Bilateral ronchus.  Gastrointestinal; soft, NT, nd Extremities; no edema Neuro-alert  Data Reviewed: CBC: Recent Labs  Lab 09/04/17 1011 09/05/17 0552 09/06/17 0504  WBC 9.2 8.3 9.8  HGB 10.1* 10.0* 10.8*  HCT 34.4* 34.4* 36.6  MCV 81.7 80.8 81.2  PLT 445* 374 478*   Basic Metabolic Panel: Recent Labs  Lab 09/04/17 1011 09/05/17 0552 09/06/17 0504 09/07/17 0426 09/08/17 0500 09/09/17 1153  NA 150* 152* 154* 150* 142 141  K 3.5 3.4* 3.0* 3.2* 2.9* 3.0*  CL 108 112* 112* 113* 104 106  CO2 29 27 26 25 25 26   GLUCOSE 142* 181* 181* 143* 161* 138*  BUN 32* 32* 36* 32* 23* 17  CREATININE 0.67 0.83 0.78 0.67 0.57 0.58  CALCIUM 10.1 10.2 9.8 9.5 9.1 9.3  MG 2.0 2.1  --   --   --   --   PHOS 3.5 3.1  --   --   --   --    GFR: Estimated Creatinine Clearance: 95.4 mL/min (by C-G formula based on SCr of 0.58 mg/dL). Liver Function Tests: No results for input(s): AST, ALT, ALKPHOS, BILITOT, PROT, ALBUMIN in the last 168 hours. No results for input(s): LIPASE, AMYLASE in the last 168 hours. No results for input(s): AMMONIA in the last 168 hours. Coagulation Profile: No results for input(s): INR, PROTIME in the last 168 hours. Cardiac Enzymes: No results for input(s): CKTOTAL, CKMB, CKMBINDEX, TROPONINI in the last 168 hours. BNP  (last 3 results) No results for input(s): PROBNP in the last 8760 hours. HbA1C: No results for input(s): HGBA1C in the last 72 hours. CBG: Recent Labs  Lab 09/08/17 1223 09/08/17 1738 09/08/17 2044 09/09/17 0003 09/09/17 0415  GLUCAP 144* 133* 132* 126* 141*   Lipid Profile: No results for input(s): CHOL, HDL, LDLCALC, TRIG, CHOLHDL, LDLDIRECT in the last 72 hours. Thyroid Function Tests: No results for input(s): TSH, T4TOTAL, FREET4, T3FREE, THYROIDAB in the last 72 hours. Anemia Panel: No results for input(s): VITAMINB12, FOLATE, FERRITIN, TIBC, IRON, RETICCTPCT in the last 72 hours. Urine analysis:    Component Value Date/Time   COLORURINE YELLOW 08/20/2017 1019   APPEARANCEUR TURBID (A) 08/20/2017 1019   LABSPEC 1.029 08/20/2017 1019   PHURINE 5.0 08/20/2017 1019   GLUCOSEU 50 (A) 08/20/2017 1019   HGBUR SMALL (A)  08/20/2017 1019   BILIRUBINUR NEGATIVE 08/20/2017 1019   KETONESUR 5 (A) 08/20/2017 1019   PROTEINUR 100 (A) 08/20/2017 1019   NITRITE NEGATIVE 08/20/2017 1019   LEUKOCYTESUR NEGATIVE 08/20/2017 1019   Sepsis Labs: @LABRCNTIP (procalcitonin:4,lacticidven:4)  )No results found for this or any previous visit (from the past 240 hour(s)).    Studies: No results found.  Scheduled Meds: . albuterol  2.5 mg Nebulization TID  . amLODipine  10 mg Oral Daily  . bisacodyl  10 mg Rectal Daily  . docusate  100 mg Per Tube BID  . enoxaparin (LOVENOX) injection  50 mg Subcutaneous Q24H  . feeding supplement (JEVITY 1.5 CAL/FIBER)  1,000 mL Per Tube Q24H  . [START ON 09/10/2017] feeding supplement (PRO-STAT SUGAR FREE 64)  30 mL Per Tube Daily  . free water  250 mL Per Tube Q4H  . insulin aspart  0-15 Units Subcutaneous Q4H  . mouth rinse  15 mL Mouth Rinse QID  . multivitamin  15 mL Oral Daily  . potassium chloride  40 mEq Oral Once  . sodium chloride flush  10-40 mL Intracatheter Q12H    Continuous Infusions: . sodium chloride 10 mL/hr at 09/03/17 0700      LOS: 22 days     Elmarie Shiley, MD [facility] Pager [phone]  If 7PM-7AM, please contact night-coverage [email or web address] Password [facility] 09/09/2017, 3:27 PM

## 2017-09-09 NOTE — PMR Pre-admission (Signed)
PMR Admission Coordinator Pre-Admission Assessment  Patient: Cassie [name] is an 52 y.o., female MRN: [id number] DOB: 18-Jun-1966 Height: 5\' 4"  (162.6 cm) Weight: 101.7 kg (224 lb 3.3 oz)              Insurance Information  PRIMARY: uninsured       Medicaid Application Date:       Case Manager:  Disability Application Date:       Case Worker:   Emergency Facilities manager Information    Name Relation Home Work Mobile   [place] Significant other [phone]       Current Medical History  Patient Admitting Diagnosis: Debility  History of Present Illness:  HPI: Cassie [name] a 52 y.o. right handed femalewith history of hypothyroid, obesity, recent travel to [place] on vacation. Admitted on 08/17/17 with acute hypoxic respiratory failure due to severe influenza A superimposed on pneumonia. She was treated with IVIG X 2 v/s placebo for influenza A phase two study. She continued to have fevers and was treated with antibiotics, Tamiflu as well as IV lasix but developed ARDS with septic shock requiring intubation. She had difficulty with extubation attempts due to stridor requiring tracheostomy on 2/8. She was extubated to ATC and downsized to #4 Cuffless 09/09/2017 with and then to cap on Monday, 09/12/2017 and consider decannulation 09/13/2017.  Diet has been advanced to dysphagia #3 nectar liquids. Subcutaneous Lovenox for DVT prophylaxis.   Past Medical History  Past Medical History:  Diagnosis Date  . Hypothyroidism     Family History  family history includes Kidney disease in her father.  Prior Rehab/Hospitalizations:  Has the patient had major surgery during 100 days prior to admission? No  Current Medications   Current Facility-Administered Medications:  .  0.9 %  sodium chloride infusion, , Intravenous, Continuous, Byrum, Rose Fillers, MD, Last Rate: 10 mL/hr at 09/03/17 0700 .  acetaminophen (TYLENOL) solution 650 mg, 650 mg, Per Tube, Q6H PRN,  Rush Farmer, MD, 650 mg at 09/03/17 0160 .  albuterol (PROVENTIL) (2.5 MG/3ML) 0.083% nebulizer solution 2.5 mg, 2.5 mg, Nebulization, TID, Rush Farmer, MD, 2.5 mg at 09/09/17 0734 .  amLODipine (NORVASC) tablet 10 mg, 10 mg, Oral, Daily, Regalado, Belkys A, MD, 10 mg at 09/09/17 0955 .  bisacodyl (DULCOLAX) suppository 10 mg, 10 mg, Rectal, Daily, Harbrecht, Lawrence, MD, 10 mg at 09/03/17 0930 .  docusate (COLACE) 50 MG/5ML liquid 100 mg, 100 mg, Per Tube, BID, Rush Farmer, MD, 100 mg at 09/09/17 0955 .  enoxaparin (LOVENOX) injection 50 mg, 50 mg, Subcutaneous, Q24H, Rosita Fire, MD, 50 mg at 09/09/17 1255 .  feeding supplement (JEVITY 1.5 CAL/FIBER) liquid 1,000 mL, 1,000 mL, Per Tube, Q24H, Regalado, Belkys A, MD .  Derrill Memo ON 09/10/2017] feeding supplement (PRO-STAT SUGAR FREE 64) liquid 30 mL, 30 mL, Per Tube, Daily, Regalado, Belkys A, MD .  free water 250 mL, 250 mL, Per Tube, Q4H, Rush Farmer, MD, 250 mL at 09/09/17 1300 .  hydrALAZINE (APRESOLINE) injection 10 mg, 10 mg, Intravenous, Q6H PRN, Katherine Roan, MD, 10 mg at 09/06/17 1752 .  insulin aspart (novoLOG) injection 0-15 Units, 0-15 Units, Subcutaneous, Q4H, Simonne Maffucci B, MD, 2 Units at 09/09/17 1255 .  levalbuterol (XOPENEX) nebulizer solution 1.25 mg, 1.25 mg, Nebulization, Q6H PRN, Ivor Costa, MD, 1.25 mg at 08/24/17 1455 .  MEDLINE mouth rinse, 15 mL, Mouth Rinse, QID, Rush Farmer, MD, 15 mL at 09/08/17 0402 .  multivitamin liquid 15 mL,  15 mL, Oral, Daily, Oretha Milch D, MD, 15 mL at 09/09/17 0955 .  ondansetron (ZOFRAN) injection 4 mg, 4 mg, Intravenous, Q8H PRN, Ivor Costa, MD .  potassium chloride (KLOR-CON) packet 40 mEq, 40 mEq, Oral, Once, Regalado, Belkys A, MD .  RESOURCE THICKENUP CLEAR, , Oral, PRN, Regalado, Belkys A, MD .  sodium chloride flush (NS) 0.9 % injection 10-40 mL, 10-40 mL, Intracatheter, Q12H, McQuaid, Douglas B, MD, 10 mL at 09/09/17 1000 .  sodium chloride  flush (NS) 0.9 % injection 10-40 mL, 10-40 mL, Intracatheter, PRN, Juanito Doom, MD  Patients Current Diet: DIET DYS 3 Room service appropriate? 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 None of the above  Prior Functional Level Prior Function Level of Independence: Independent  Self Care: Did the patient need help bathing, dressing, using the toilet or eating?  Independent  Indoor Mobility: Did the patient need assistance with walking from room to room (with or without device)? Independent  Stairs: Did the patient need assistance with internal or external stairs (with or without device)? Independent  Functional Cognition: Did the patient need help planning regular tasks such as shopping or remembering to take medications? Independent  Current Functional Level Cognition  Overall Cognitive Status: Difficult to assess Difficult to assess due to: Tracheostomy Orientation Level: Oriented X4 General Comments: Appears intact    Extremity Assessment (includes Sensation/Coordination)  Upper Extremity Assessment: Generalized weakness(able to hold pen to write)  Lower Extremity Assessment: Generalized weakness    ADLs  Overall ADL's : Needs assistance/impaired Eating/Feeding: NPO Grooming: Set up, Sitting Upper Body Bathing: Moderate assistance, Sitting Lower Body Bathing: Moderate assistance, Sit to/from stand Upper Body Dressing : Moderate assistance Lower Body  Dressing: Moderate assistance, Sit to/from stand Toilet Transfer: Moderate assistance, Buyer, retail Details (indicate cue type and reason): mod A for initial boost, assist for balance with pivotal steps to recliner Toileting- Clothing Manipulation and Hygiene: Moderate assistance, Sit to/from stand Functional mobility during ADLs: Moderate assistance General ADL Comments: HHA for balance only    Mobility  Overal bed mobility: Needs Assistance Bed Mobility: Supine to Sit Supine to sit: HOB elevated, Min guard General bed mobility comments: min guard for safety    Transfers  Overall transfer level: Needs assistance Equipment used: Rolling walker (2 wheeled) Transfers: Sit to/from Stand Sit to Stand: Min assist Stand pivot transfers: Mod assist General transfer comment: assist to steady upon standing; 2 attempts to stand prior to achieving standing    Ambulation / Gait / Stairs / Wheelchair Mobility  Ambulation/Gait Ambulation/Gait assistance: Museum/gallery curator (Feet): 150 Feet Assistive device: Rolling walker (2 wheeled) Gait Pattern/deviations: Step-through pattern, Decreased stride length, Drifts right/left General Gait Details: assist for balance and managing RW at times especially with turning; pt with increased unsteadiness with horizontal head turns Gait velocity: decr Gait velocity interpretation: Below normal speed for age/gender    Posture / Balance Balance Overall balance assessment: Needs assistance Sitting-balance support: No upper extremity supported, Feet supported Sitting balance-Leahy Scale: Good Standing balance support: Single  extremity supported, During functional activity Standing balance-Leahy Scale: Poor Standing balance comment: dependent on external support    Special needs/care consideration BiPAP/CPAP  N/a CPM  N/a Continuous Drip IV  N/a Dialysis  N/a Life Vest  N/a Oxygen 28 % trach collar Special Bed  N/a Trach Size  switched to #4 uncuffed on 2/15; use of PMSV with supervision Wound Vac n/a Skin ecchymosis to bilateral upper extremities Bowel mgmt: continent LBM 09/08/17 Bladder mgmt: continent Diabetic mgmt n/a   Previous Home Environment Living Arrangements: Spouse/significant other(lives with fiance, Josh)  Lives With: Significant other Available Help at Discharge: Family, Available 24 hours/day(Josh avaialble as needed) Type of Home: House Home Layout: Two level Alternate Level Stairs-Number of Steps: flight Home Access: Stairs to enter Bathroom Shower/Tub: Chiropodist: Standard Bathroom Accessibility: Yes How Accessible: Accessible via walker Hamel: No Additional Comments: Has home in [place] and also 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) Contact Information: Josh, fiance Anticipated Caregiver: Josh Anticipated Caregiver's Contact Information: [phone] Ability/Limitations of Caregiver: Merrily Pew also manages their buisness Caregiver Availability: 24/7 Discharge Plan Discussed with Primary Caregiver: Yes Is Caregiver In Agreement with Plan?: Yes Does Caregiver/Family have Issues with Lodging/Transportation while Pt is in Rehab?: No  Goals/Additional Needs Patient/Family Goal for Rehab: Mod I to supervision with PT, OT, and SLP Expected length of stay: ELOS 10 to 14  days Pt/Family Agrees to Admission and willing to participate: Yes Program Orientation Provided & Reviewed with Pt/Caregiver Including Roles  & Responsibilities: Yes  Decrease burden of Care through IP rehab admission: n/a  Possible need for SNF placement upon discharge:not anticipated  Patient Condition: This patient's medical and functional status has changed since the consult dated: 09/07/2017 in which the Rehabilitation Physician determined and documented that the patient's condition is appropriate for intensive rehabilitative care in an inpatient rehabilitation facility. See "History of Present Illness" (above) for medical update. Functional changes are: min to mod assist. Patient's medical and functional status update has been discussed with the Rehabilitation physician and patient remains appropriate for inpatient rehabilitation. Will admit to inpatient rehab on Saturday 09/10/2017 when bed is available.  Preadmission Screen Completed By:  Cleatrice Burke, 09/09/2017 4:05 PM ______________________________________________________________________   Discussed status with Dr. Naaman Plummer on 09/09/2017 at 1611 and received telephone approval for admission on Saturday when bed is available 09/10/2017  Admission Coordinator:  Cleatrice Burke, time 2376 Date 09/09/2017.

## 2017-09-09 NOTE — Progress Notes (Signed)
  Speech Language Pathology Treatment: Dysphagia;[name] Speaking valve  Patient Details Name: Cassie [name] MRN: [id number] DOB: Jul 12, 1966 Today's Date: 09/09/2017 Time: 5638-9373 SLP Time Calculation (min) (ACUTE ONLY): 15 min  Assessment / Plan / Recommendation Clinical Impression  SLP treatment completed during part of breakfast. Verbal cues provided asking pt to cough, successful via trach, then placed valve which remained on trach hub only coming off when pt coughed (SLP attached leash). Delayed throat clears, mild cough x 1. Appropriate bite/sip size and rate. Recommend continue Dys 3, nectar, PMV donned during meals/meds. ST to continue to treat for safe diet and timeline for reassessment for upgrade liquids.    HPI HPI: 52 y/o female with minimal past medical history admitted with ARDS from Influenza A. Received Tamiflu, currently enrolled in a phase 2 clinical trial for an influenza investigational agent. She was intubated for airway support and respiratory distress (ARDS) on 1/25. Gradual improvement over the following several days. Attempted extubation on 2/7 but patient had significant stridor and respiratory distress so she was re-intubated shortly after. Tracheostomy placed 2/8. MBS 2/10 with recommendation for continued NPO.      SLP Plan  Continue with current plan of care       Recommendations  Diet recommendations: Dysphagia 3 (mechanical soft);Nectar-thick liquid Liquids provided via: Cup Medication Administration: Whole meds with puree Supervision: Patient able to self feed;Intermittent supervision to cue for compensatory strategies Compensations: Slow rate;Small sips/bites Postural Changes and/or Swallow Maneuvers: Seated upright 90 degrees      Patient may use Passy-Muir Speech Valve: During all therapies with supervision PMSV Supervision: Full MD: Please consider changing trach tube to : Smaller size         Oral Care Recommendations: Oral care  BID Follow up Recommendations: Inpatient Rehab SLP Visit Diagnosis: Dysphagia, unspecified (R13.10);Aphonia (R49.1) Plan: Continue with current plan of care                       Cassie [name] 09/09/2017, 11:35 AM   Cassie [name].Ed [employer] [phone]

## 2017-09-09 NOTE — Progress Notes (Signed)
  Speech Language Pathology Treatment: Cassie [name] Speaking valve  Patient Details Name: Cassie [name] MRN: [id number] DOB: Jan 26, 1966 Today's Date: 09/09/2017 Time: 0301-3143 SLP Time Calculation (min) (ACUTE ONLY): 15 min  Assessment / Plan / Recommendation Clinical Impression  This therapist returned to room to modify PMV sign to reflect upgrade to wearing valve during all waking hours. RT's present and changing trach to cuffless #4. PMV placed and continued with whisper/false vocal cord quality. Facilitated vocal cord adduction with verbal cues and SLP demonstration to increase inhalation and intensity successfully and briefly with audible true vocal cord adduction. Educated to speak in smaller "chunks" and decrease rate. Vitals stable. Pt may wear valve during all waking hours. Scheduled to go to CIR tomorrow.   HPI HPI: 52 y/o female with minimal past medical history admitted with ARDS from Influenza A. Received Tamiflu, currently enrolled in a phase 2 clinical trial for an influenza investigational agent. She was intubated for airway support and respiratory distress (ARDS) on 1/25. Gradual improvement over the following several days. Attempted extubation on 2/7 but patient had significant stridor and respiratory distress so she was re-intubated shortly after. Tracheostomy placed 2/8. MBS 2/10 with recommendation for continued NPO.      SLP Plan  Continue with current plan of care       Recommendations  Diet recommendations: Nectar-thick liquid;Dysphagia 3 (mechanical soft) Liquids provided via: Straw;Cup Medication Administration: Whole meds with puree Supervision: Patient able to self feed;Intermittent supervision to cue for compensatory strategies Compensations: Slow rate;Small sips/bites(PMV on with meals) Postural Changes and/or Swallow Maneuvers: Seated upright 90 degrees      Patient may use Passy-Muir Speech Valve: During all waking hours (remove during sleep) PMSV  Supervision: Full         Oral Care Recommendations: Oral care BID Follow up Recommendations: Inpatient Rehab SLP Visit Diagnosis: Aphonia (R49.1) Plan: Continue with current plan of care                      Houston Siren 09/09/2017, 4:11 PM   Orbie Pyo Colvin Caroli.Ed [employer] [phone]

## 2017-09-09 NOTE — Progress Notes (Addendum)
Nutrition Follow-up  DOCUMENTATION CODES:   Obesity unspecified  INTERVENTION:   -Initiate nocturnal feedings of  Jevity 1.5 @ 45 ml/hr via cortrak tube over 12 hour period  30 ml Prostat daily.    Tube feeding regimen provides 910 kcal (63% of needs), 49 grams of protein, and 410 ml of H2O.   NUTRITION DIAGNOSIS:   Inadequate oral intake related to dysphagia, lethargy/confusion as evidenced by meal completion < 25%.  Progressing  GOAL:   Patient will meet greater than or equal to 90% of their needs  Progressing  MONITOR:   PO intake, Diet advancement, Labs, Weight trends, TF tolerance, Skin, I & O's  REASON FOR ASSESSMENT:   Consult Enteral/tube feeding initiation and management  ASSESSMENT:   52 y/o female PMHx hypothyroidism. Presented w/ SOB/Cough x5 days as well as fever/chills. Worked up for acute respiratory failure w/ hypoxia d/t multifocal PNA and sepsis. Respiratory status continued to decline, developed ARDS and ultimately noninvasive measures failed and was intubated 1/25. Influenza A+. RD consulted for TF.   2/7- extubated, re-intubated due to stridor 2/8- cortrak placed (post-pyloric), s/p trach, bronchoscopy 2/9- transitioned to trach collar 2/10- s/p BSE, recommend NPO 2/12- trach change (downgraded to #6 cuffless) 2/14- s/p MBSS- advanced to dysphagia 3 diet with nectar thick liquids  Pt very somnolent at time of visit. Remains on trach collar. Noted TF continues to infuse via cortrak tube (Jevity 1.5 @ 35 ml/hr with Prostat TID and 250 ml free water flush every 4 hours). Complete TF regimen providing 1320 kcals, 116 grams protein, ans 2103 ml free water daily, meeting 91% of estimated kcal needs and 100% of estimated protein needs.  Case discussed with RN, who reports that pt tolerating current diet consistency well, however, not eating much due to somnolence. She confirms plan to decrease TF to help stimulate appetite. RN has just prepared thickened  grape juice for pt to consume once more alert.   Labs reviewed: CBGS: 126-141 (inpatient orders for glycemic control are 0-15 units insulin aspart every 4 hours).   Diet Order:  DIET DYS 3 Room service appropriate? Yes; Fluid consistency: Nectar Thick  EDUCATION NEEDS:   No education needs have been identified at this time  Skin:  Skin Assessment: Skin Integrity Issues: Skin Integrity Issues:: Other (Comment) Other: friction wound to rt and lt buttocks  Last BM:  09/08/17  Height:   Ht Readings from Last 1 Encounters:  08/17/17 5\' 4"  (1.626 m)    Weight:   Wt Readings from Last 1 Encounters:  09/09/17 224 lb 3.3 oz (101.7 kg)    Ideal Body Weight:  54.54 kg  BMI:  Body mass index is 38.49 kg/m.  Estimated Nutritional Needs:   Kcal:  1450-1650  Protein:  95-110 grams  Fluid:  1.4-1.6 L    Juliet Vasbinder A. Jimmye Norman, RD, LDN, CDE Pager: [phone] After hours Pager: [phone]

## 2017-09-09 NOTE — Progress Notes (Signed)
[facility] Progress Note Patient Name: Cassie [name] DOB: 01-13-1966 MRN: [id number]   Date of Service  09/09/2017  HPI/Events of Note  Low K 3.0  eICU Interventions  Give 40 meq kcl     Intervention Category Minor Interventions: Electrolytes abnormality - evaluation and management  Sharia Reeve 09/09/2017, 7:35 PM

## 2017-09-09 NOTE — Progress Notes (Signed)
I have an inpt rehab bed available to admit pt to on Saturday. I met with pt at bedside and she is in agreement to admit. RN CM made aware. Dr. Tyrell Antonio made aware. I will make the arrangements to admit Saturday. Charge RN on unit 4000 can be reached in the morning to assist with admission timing and exact room number. He/She can be contacted at [phone]. Dr. Naaman Plummer will be the admitting doctor Saturday. [phone]

## 2017-09-09 NOTE — Progress Notes (Signed)
Occupational Therapy Treatment Patient Details Name: Cassie [name] MRN: [id number] DOB: Jan 20, 1966 Today's Date: 09/09/2017    History of present illness Pt adm with ARDS due to influenza A. Intubated on 1/25. Extubated on 2/7 but reintubated. Trached 2/8. Currently on trach collar. PMH - hypthyroidism, obesity   OT comments  Pt progressing towards OT goals this session. Improved balance and independence during recliner to bed transfer. OT offered to assist with washing and combing out Pt's hair, and Pt declined "It's extensions" Pt excited about getting trach down a size. OT will continue to follow prior to post-acute rehab. Pt remains excellent CIR candidate.  Follow Up Recommendations  CIR;Supervision/Assistance - 24 hour    Equipment Recommendations  Other (comment)(defer to next venue)    Recommendations for Other Services      Precautions / Restrictions Precautions Precautions: Fall Restrictions Weight Bearing Restrictions: No       Mobility Bed Mobility Overal bed mobility: Needs Assistance Bed Mobility: Sit to Supine       Sit to supine: Supervision   General bed mobility comments: no assist needed  Transfers Overall transfer level: Needs assistance Equipment used: 1 person hand held assist Transfers: Sit to/from Stand;Stand Pivot Transfers Sit to Stand: Min assist Stand pivot transfers: Min assist       General transfer comment: min A for steady - no need for assist with power up    Balance Overall balance assessment: Needs assistance Sitting-balance support: No upper extremity supported;Feet supported Sitting balance-Leahy Scale: Good     Standing balance support: Single extremity supported;During functional activity Standing balance-Leahy Scale: Poor Standing balance comment: dependent on external support                           ADL either performed or assessed with clinical judgement   ADL Overall ADL's : Needs  assistance/impaired Eating/Feeding: NPO                       Toilet Transfer: Minimal assistance;Stand-pivot Toilet Transfer Details (indicate cue type and reason): 1 HHA         Functional mobility during ADLs: Min guard General ADL Comments: HHA for balance only     Vision       Perception     Praxis      Cognition Arousal/Alertness: Awake/alert Behavior During Therapy: WFL for tasks assessed/performed Overall Cognitive Status: Difficult to assess                                 General Comments: Appears intact        Exercises     Shoulder Instructions       General Comments VSS throughout    Pertinent Vitals/ Pain       Pain Assessment: No/denies pain Pain Intervention(s): Monitored during session  Home Living   Living Arrangements: Spouse/significant other(lives with fiance, Josh) Available Help at Discharge: Family;Available 24 hours/day(Josh avaialble as needed)   Home Access: Stairs to enter                 Bathroom Accessibility: Yes How Accessible: Accessible via walker     Additional Comments: 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 They split their time in between  Lives With: Significant other    Prior Functioning/Environment  Frequency  Min 3X/week        Progress Toward Goals  OT Goals(current goals can now be found in the care plan section)  Progress towards OT goals: Progressing toward goals  Acute Rehab OT Goals Patient Stated Goal: get stronger and back to PLOF prior to return home OT Goal Formulation: With patient Time For Goal Achievement: 09/21/17 Potential to Achieve Goals: Good  Plan Discharge plan remains appropriate;Frequency remains appropriate    Co-evaluation                 AM-PAC PT "6 Clicks" Daily Activity     Outcome Measure   Help from another person eating meals?: Total(NPO) Help from another person taking care of  personal grooming?: A Little Help from another person toileting, which includes using toliet, bedpan, or urinal?: A Little Help from another person bathing (including washing, rinsing, drying)?: A Lot Help from another person to put on and taking off regular upper body clothing?: A Lot Help from another person to put on and taking off regular lower body clothing?: A Little 6 Click Score: 14    End of Session Equipment Utilized During Treatment: Oxygen  OT Visit Diagnosis: Unsteadiness on feet (R26.81);Other abnormalities of gait and mobility (R26.89);Muscle weakness (generalized) (M62.81)   Activity Tolerance Patient tolerated treatment well   Patient Left in bed;with call bell/phone within reach   Nurse Communication Mobility status        Time: 1165-7903 OT Time Calculation (min): 24 min  Charges: OT General Charges $OT Visit: 1 Visit OT Treatments $Self Care/Home Management : 23-37 mins  Hulda Humphrey OTR/L [place] 09/09/2017, 4:44 PM

## 2017-09-09 NOTE — Research (Signed)
Title: A Randomized, Double-Blind, Placebo-Controlled Dose Ranging Study Evaluating the Safety Pharmacokinetics and Clinical Benefit of FLU-IGIV in Hospitalized Patients with Serious Influenza A infection. IA-001 (ClinicalTrials.gov Identifier: [id number], Protocol No: IA-001, [facility] Protocol #[id number])  RESEARCH SUBJECT. This research study is sponsored by [employer]   Protocol:  - amendment : #4 -> 16 Dec 2016   -  Administrative Change Memo - 03/JAN/2019  - Investigator Bronchure: 4.0 - 16 Dec 2016 - double checked to be correct YES  Clinical Research Coordinator note : This visit for Subject Cassie [name] with DOB: Dec 07, 1965 on 09/09/2017 for the above protocol is Visit/Encounter # Day 22  and is for purpose of Follow up .   In this visit 09/09/2017 the subject was evaluated by investigator named Dr. Brand Males who is up to date with his training   During today's assesment the patient was awake, alert and oriented at the time of assessment. She was able to understand and follow commands and responded to my questions with head nods. All study procedures were carried out per the above stated protocol. The patient was thanked for her continued consent and contribution to science. Please refer to patients paper source binder for further documentation regarding todays visit.  Signed by  T. [place], [place] 2:01 [place] 09/09/2017

## 2017-09-09 NOTE — Care Management Note (Addendum)
Case Management Note  Patient Details  Name: Cassie [name] MRN: [id number] Date of Birth: [birth year]  Subjective/Objective:      Pt admitted with ARDs secondary to flu              Action/Plan:  From home.  Still requiring ventilator    Expected Discharge Date:                  Expected Discharge Plan:  Home/Self Care  In-House Referral:     Discharge planning Services  CM Consult, Medication Assistance  Post Acute Care Choice:    Choice offered to:     DME Arranged:    DME Agency:     HH Arranged:    HH Agency:     Status of Service:  In process, will continue to follow  If discussed at Long Length of Stay Meetings, dates discussed:    Additional Comments: 09/09/2017  Pt remains appropriate for continued stay.  Plan is for 2/15 change to a cuffless 4 and if tolerated over the weekend then cap on Monday morning and overnight, if no issues while capped overnight then may decannulate on Tuesday.  CIR continuing to follow along with SNF (will require LOG).    09/07/17 CIR recommended - CSW consulted for back up plan  09/05/17 Pt was completely independent from home PTA.  Pt now has trach - TC over 24 hours. PT eval ordered [name], RN 09/09/2017, 8:29 AM

## 2017-09-09 NOTE — NC FL2 (Signed)
[place] LEVEL OF CARE SCREENING TOOL     IDENTIFICATION  Patient Name: Cassie [name] Birthdate: March 20, 1966 Sex: female Admission Date (Current Location): 08/17/2017  [facility] and [place] Number:  Herbalist and Address:  [facility] [place], [place] [address] [place], [place] [address]      Provider Number: [id number]  Attending Physician Name and Address:  Elmarie Shiley, MD  Relative Name and Phone Number:       Current Level of Care: Hospital Recommended Level of Care: [place] Prior Approval Number:    Date Approved/Denied:   PASRR Number: [id number]  Discharge Plan: SNF    Current Diagnoses: Patient Active Problem List   Diagnosis Date Noted  . Tachycardia   . Tracheostomy in place [facility])   . Oropharyngeal dysphagia   . Morbid obesity ([place])   . Tachypnea   . Reactive hypertension   . Prediabetes   . Hypokalemia   . Acute blood loss anemia   . Hyperglycemia 09/06/2017  . Sinus tachycardia 09/06/2017  . Hypernatremia 09/06/2017  . Acute respiratory failure with hypoxemia ([place])   . Tracheostomy status ([place])   . Transaminitis 08/19/2017  . Electrolyte imbalance 08/19/2017  . Influenza B   . ARDS (adult respiratory distress syndrome) ([place])   . Influenza A 08/18/2017  . Acute lung injury 08/18/2017  . Multifocal pneumonia 08/17/2017  . Acute respiratory failure ([place]) 08/17/2017  . Sepsis ([place]) 08/17/2017  . Hypothyroidism     Orientation RESPIRATION BLADDER Height & Weight     Self, Time, Situation, Place  Normal Continent Weight: 224 lb 3.3 oz (101.7 kg) Height:  5\' 4"  (162.6 cm)  BEHAVIORAL SYMPTOMS/MOOD NEUROLOGICAL BOWEL NUTRITION STATUS      Continent (Please see d/c summary)  AMBULATORY STATUS COMMUNICATION OF NEEDS Skin   Limited Assist Verbally Skin abrasions(Open wound on buttocks, foam dressing, PRN)                       Personal Care Assistance Level of Assistance   Bathing, Dressing, Feeding Bathing Assistance: Limited assistance Feeding assistance: Independent Dressing Assistance: Limited assistance     Functional Limitations Info  Sight, Hearing, Speech Sight Info: Adequate Hearing Info: Adequate Speech Info: Adequate    SPECIAL CARE FACTORS FREQUENCY  PT (By licensed PT), OT (By licensed OT)     PT Frequency: 3x OT Frequency: 3x            Contractures Contractures Info: Not present    Additional Factors Info  Code Status, Allergies Code Status Info: Full Code Allergies Info: Sulfa Antibiotics           Current Medications (09/09/2017):  This is the current hospital active medication list Current Facility-Administered Medications  Medication Dose Route Frequency Provider Last Rate Last Dose  . 0.9 %  sodium chloride infusion   Intravenous Continuous Collene Gobble, MD 10 mL/hr at 09/03/17 0700    . acetaminophen (TYLENOL) solution 650 mg  650 mg Per Tube Q6H PRN Rush Farmer, MD   650 mg at 09/03/17 9417  . albuterol (PROVENTIL) (2.5 MG/3ML) 0.083% nebulizer solution 2.5 mg  2.5 mg Nebulization TID Rush Farmer, MD   2.5 mg at 09/09/17 0734  . amLODipine (NORVASC) tablet 10 mg  10 mg Oral Daily Regalado, Belkys A, MD   10 mg at 09/09/17 0955  . bisacodyl (DULCOLAX) suppository 10 mg  10 mg Rectal Daily Kathi Ludwig, MD  10 mg at 09/03/17 0930  . docusate (COLACE) 50 MG/5ML liquid 100 mg  100 mg Per Tube BID Rush Farmer, MD   100 mg at 09/09/17 0955  . enoxaparin (LOVENOX) injection 50 mg  50 mg Subcutaneous Q24H Rosita Fire, MD   50 mg at 09/09/17 1255  . feeding supplement (JEVITY 1.5 CAL/FIBER) liquid 1,000 mL  1,000 mL Per Tube Q24H Regalado, Belkys A, MD      . Derrill Memo ON 09/10/2017] feeding supplement (PRO-STAT SUGAR FREE 64) liquid 30 mL  30 mL Per Tube Daily Regalado, Belkys A, MD      . free water 250 mL  250 mL Per Tube Q4H Rush Farmer, MD   250 mL at 09/09/17 1300  . hydrALAZINE  (APRESOLINE) injection 10 mg  10 mg Intravenous Q6H PRN Katherine Roan, MD   10 mg at 09/06/17 1752  . insulin aspart (novoLOG) injection 0-15 Units  0-15 Units Subcutaneous Q4H Juanito Doom, MD   2 Units at 09/09/17 1255  . levalbuterol (XOPENEX) nebulizer solution 1.25 mg  1.25 mg Nebulization Q6H PRN Ivor Costa, MD   1.25 mg at 08/24/17 1455  . MEDLINE mouth rinse  15 mL Mouth Rinse QID Rush Farmer, MD   15 mL at 09/08/17 0402  . multivitamin liquid 15 mL  15 mL Oral Daily Oretha Milch D, MD   15 mL at 09/09/17 0955  . ondansetron (ZOFRAN) injection 4 mg  4 mg Intravenous Q8H PRN Ivor Costa, MD      . potassium chloride (KLOR-CON) packet 40 mEq  40 mEq Oral Once Regalado, Belkys A, MD      . RESOURCE THICKENUP CLEAR   Oral PRN Regalado, Belkys A, MD      . sodium chloride flush (NS) 0.9 % injection 10-40 mL  10-40 mL Intracatheter Q12H Simonne Maffucci B, MD   10 mL at 09/09/17 1000  . sodium chloride flush (NS) 0.9 % injection 10-40 mL  10-40 mL Intracatheter PRN Juanito Doom, MD         Discharge Medications: Please see discharge summary for a list of discharge medications.  Relevant Imaging Results:  Relevant Lab Results:   Additional Information SSN: [id number]  Eileen Stanford, LCSW

## 2017-09-10 ENCOUNTER — Encounter (HOSPITAL_COMMUNITY): Payer: Self-pay | Admitting: Nurse Practitioner

## 2017-09-10 ENCOUNTER — Inpatient Hospital Stay (HOSPITAL_COMMUNITY)
Admit: 2017-09-10 | Discharge: 2017-09-15 | DRG: 945 | Disposition: A | Discharge status: Home / Self Care | Payer: Self-pay | Attending: Physical Medicine & Rehabilitation | Admitting: Physical Medicine & Rehabilitation

## 2017-09-10 DIAGNOSIS — J80 Acute respiratory distress syndrome: Secondary | ICD-10-CM | POA: Diagnosis present

## 2017-09-10 DIAGNOSIS — Z930 Tracheostomy status: Secondary | ICD-10-CM

## 2017-09-10 DIAGNOSIS — D62 Acute posthemorrhagic anemia: Secondary | ICD-10-CM | POA: Diagnosis present

## 2017-09-10 DIAGNOSIS — Z9911 Dependence on respirator [ventilator] status: Secondary | ICD-10-CM

## 2017-09-10 DIAGNOSIS — I10 Essential (primary) hypertension: Secondary | ICD-10-CM | POA: Diagnosis present

## 2017-09-10 DIAGNOSIS — R1310 Dysphagia, unspecified: Secondary | ICD-10-CM | POA: Diagnosis present

## 2017-09-10 DIAGNOSIS — E669 Obesity, unspecified: Secondary | ICD-10-CM | POA: Diagnosis present

## 2017-09-10 DIAGNOSIS — E039 Hypothyroidism, unspecified: Secondary | ICD-10-CM | POA: Diagnosis present

## 2017-09-10 DIAGNOSIS — Z6835 Body mass index (BMI) 35.0-35.9, adult: Secondary | ICD-10-CM

## 2017-09-10 DIAGNOSIS — D6489 Other specified anemias: Secondary | ICD-10-CM | POA: Diagnosis present

## 2017-09-10 DIAGNOSIS — E876 Hypokalemia: Secondary | ICD-10-CM | POA: Diagnosis present

## 2017-09-10 DIAGNOSIS — R5381 Other malaise: Principal | ICD-10-CM

## 2017-09-10 LAB — CBC
HCT: 36.4 % (ref 36.0–46.0)
Hemoglobin: 11 g/dL — ABNORMAL LOW (ref 12.0–15.0)
MCH: 24.3 pg — ABNORMAL LOW (ref 26.0–34.0)
MCHC: 30.2 g/dL (ref 30.0–36.0)
MCV: 80.4 fL (ref 78.0–100.0)
PLATELETS: 307 10*3/uL (ref 150–400)
RBC: 4.53 MIL/uL (ref 3.87–5.11)
RDW: 19.5 % — AB (ref 11.5–15.5)
WBC: 12.3 10*3/uL — AB (ref 4.0–10.5)

## 2017-09-10 LAB — CREATININE, SERUM: Creatinine, Ser: 0.6 mg/dL (ref 0.44–1.00)

## 2017-09-10 LAB — CBC WITH DIFFERENTIAL/PLATELET
BASOS ABS: 0.1 10*3/uL (ref 0.0–0.1)
Basophils Relative: 1 %
Eosinophils Absolute: 0.4 10*3/uL (ref 0.0–0.7)
Eosinophils Relative: 4 %
HEMATOCRIT: 35 % — AB (ref 36.0–46.0)
HEMOGLOBIN: 10.7 g/dL — AB (ref 12.0–15.0)
Lymphocytes Relative: 23 %
Lymphs Abs: 2.6 10*3/uL (ref 0.7–4.0)
MCH: 24.3 pg — ABNORMAL LOW (ref 26.0–34.0)
MCHC: 30.6 g/dL (ref 30.0–36.0)
MCV: 79.5 fL (ref 78.0–100.0)
MONO ABS: 0.8 10*3/uL (ref 0.1–1.0)
Monocytes Relative: 7 %
NEUTROS ABS: 7.4 10*3/uL (ref 1.7–7.7)
NEUTROS PCT: 65 %
Platelets: 266 10*3/uL (ref 150–400)
RBC: 4.4 MIL/uL (ref 3.87–5.11)
RDW: 19.4 % — AB (ref 11.5–15.5)
WBC: 11.3 10*3/uL — ABNORMAL HIGH (ref 4.0–10.5)

## 2017-09-10 LAB — BASIC METABOLIC PANEL
ANION GAP: 12 (ref 5–15)
BUN: 12 mg/dL (ref 6–20)
CO2: 24 mmol/L (ref 22–32)
Calcium: 9.2 mg/dL (ref 8.9–10.3)
Chloride: 103 mmol/L (ref 101–111)
Creatinine, Ser: 0.61 mg/dL (ref 0.44–1.00)
GLUCOSE: 187 mg/dL — AB (ref 65–99)
POTASSIUM: 3 mmol/L — AB (ref 3.5–5.1)
Sodium: 139 mmol/L (ref 135–145)

## 2017-09-10 LAB — HEPATIC FUNCTION PANEL
ALBUMIN: 3 g/dL — AB (ref 3.5–5.0)
ALT: 102 U/L — AB (ref 14–54)
AST: 56 U/L — AB (ref 15–41)
Alkaline Phosphatase: 89 U/L (ref 38–126)
TOTAL PROTEIN: 7 g/dL (ref 6.5–8.1)
Total Bilirubin: 0.4 mg/dL (ref 0.3–1.2)

## 2017-09-10 LAB — PROTIME-INR
INR: 1.06
Prothrombin Time: 13.7 seconds (ref 11.4–15.2)

## 2017-09-10 LAB — GLUCOSE, CAPILLARY
GLUCOSE-CAPILLARY: 139 mg/dL — AB (ref 65–99)
Glucose-Capillary: 123 mg/dL — ABNORMAL HIGH (ref 65–99)
Glucose-Capillary: 205 mg/dL — ABNORMAL HIGH (ref 65–99)

## 2017-09-10 LAB — TSH: TSH: 1.191 u[IU]/mL (ref 0.350–4.500)

## 2017-09-10 LAB — T4, FREE: Free T4: 1.06 ng/dL (ref 0.61–1.12)

## 2017-09-10 LAB — APTT: APTT: 27 s (ref 24–36)

## 2017-09-10 MED ORDER — ENOXAPARIN SODIUM 60 MG/0.6ML ~~LOC~~ SOLN
50.0000 mg | SUBCUTANEOUS | Status: DC
  Administered 2017-09-10 – 2017-09-14 (×5): 50 mg via SUBCUTANEOUS
  Filled 2017-09-10 (×5): qty 0.6

## 2017-09-10 MED ORDER — ONDANSETRON HCL 4 MG PO TABS: 4.0000 mg | ORAL_TABLET | Freq: Four times a day (QID) | ORAL | Status: DC | PRN

## 2017-09-10 MED ORDER — RESOURCE THICKENUP CLEAR PO POWD
ORAL | Status: DC | PRN
  Filled 2017-09-10: qty 125

## 2017-09-10 MED ORDER — AMLODIPINE BESYLATE 10 MG PO TABS
10.0000 mg | ORAL_TABLET | Freq: Every day | ORAL | Status: DC
  Administered 2017-09-11 – 2017-09-15 (×5): 10 mg via ORAL
  Filled 2017-09-10 (×5): qty 1

## 2017-09-10 MED ORDER — LEVALBUTEROL HCL 1.25 MG/0.5ML IN NEBU
1.2500 mg | INHALATION_SOLUTION | Freq: Four times a day (QID) | RESPIRATORY_TRACT | Status: DC | PRN
  Filled 2017-09-10: qty 0.5

## 2017-09-10 MED ORDER — SORBITOL 70 % SOLN: 30.0000 mL | Freq: Every day | Status: DC | PRN

## 2017-09-10 MED ORDER — DOCUSATE SODIUM 50 MG/5ML PO LIQD
100.0000 mg | Freq: Two times a day (BID) | ORAL | Status: DC
  Filled 2017-09-10 (×6): qty 10

## 2017-09-10 MED ORDER — ENOXAPARIN SODIUM 60 MG/0.6ML ~~LOC~~ SOLN: 50.0000 mg | SUBCUTANEOUS | Status: DC

## 2017-09-10 MED ORDER — ADULT MULTIVITAMIN LIQUID CH
15.0000 mL | Freq: Every day | ORAL | Status: DC
  Filled 2017-09-10 (×6): qty 15

## 2017-09-10 MED ORDER — ACETAMINOPHEN 160 MG/5ML PO SOLN: 650.0000 mg | Freq: Four times a day (QID) | ORAL | Status: DC | PRN

## 2017-09-10 MED ORDER — ONDANSETRON HCL 4 MG/2ML IJ SOLN: 4.0000 mg | Freq: Four times a day (QID) | INTRAMUSCULAR | Status: DC | PRN

## 2017-09-10 MED ORDER — PRO-STAT SUGAR FREE PO LIQD
30.0000 mL | Freq: Every day | ORAL | Status: DC
  Filled 2017-09-10 (×5): qty 30

## 2017-09-10 MED ORDER — ALBUTEROL SULFATE (2.5 MG/3ML) 0.083% IN NEBU: 2.5000 mg | INHALATION_SOLUTION | RESPIRATORY_TRACT | Status: DC | PRN

## 2017-09-10 MED ORDER — ALBUTEROL SULFATE (2.5 MG/3ML) 0.083% IN NEBU
2.5000 mg | INHALATION_SOLUTION | Freq: Three times a day (TID) | RESPIRATORY_TRACT | Status: DC
  Administered 2017-09-10 (×2): 2.5 mg via RESPIRATORY_TRACT
  Filled 2017-09-10 (×2): qty 3

## 2017-09-10 NOTE — Progress Notes (Signed)
Title: A Randomized, Double-Blind, Placebo-Controlled Dose Ranging Study Evaluating the Safety Pharmacokinetics and Clinical Benefit of FLU-IGIV in Hospitalized Patients with Serious Influenza A infection. IA-001 (ClinicalTrials.gov Identifier: [id number], Protocol No: IA-001, [facility] Protocol #[id number])  RESEARCH SUBJECT. This research study is sponsored by [employer]   Protocol:  - amendment : #4 -> 16 Dec 2016   -  Administrative Change Memo - 03/JAN/2019  - Investigator Bronchure: 4.0 - 16 Dec 2016 - double checked to be correct YES ...................................................................................................  Clinical Research Coordinator / Research RN note : This visit for Subject Cassie [name] with DOB: 1965/08/28 on 09/10/2017 for the above protocol is Visit/Encounter [facility] Discharge  and is for purpose of research .  Subject expressed continued interest and consent in continuing as a study subject. Subject confirmed that there was no change in contact information (e.g. address, telephone, email). Subject thanked for participation in research and contribution to science.   In this visit 09/10/2017 the subject will be evaluated by investigator named Dr. Brand Males . This research coordinator has verified that the investigator is uptodate with his/her training logs  Procedures completed per the above mentioned protocol. Subject was alert and able to communicate through capped trach. Subject denied all flu symptoms. Subject is being discharged today to inpatient rehab unit. Discussed Day 30 visit with the subject and she is aggreable to return to outpatient facility if she is no longer in rehab unit. Refer to the subjects paper source binder for further documentation of the visit.    Signed by  Lockney Bing, [place], [place] Coordinator  [facility]  [place], [place] 1:01 PM 09/10/2017

## 2017-09-10 NOTE — Progress Notes (Signed)
Patient ID: Cassie [name], female   DOB: 1966/02/28, 52 y.o.   MRN: [id number] Patient admitted to [phone] via wheelchair, escorted by nursing staff.  Patient verbalized understanding of rehab process as explained by RN.  Patient understands fall prevention policy.  Her trach is intact, she states the other unit was not performing trach care at all.  Performed trach care, small amount of yellow dried mucous in inner cannula.  Patient tolerating PMSV and reports she is able to cough up some phlegm through her mouth now.  PICC line red at insertion site, will alert MD.  Patient appears to be in no immediate distress at this time.  Brita Romp, RN

## 2017-09-10 NOTE — Progress Notes (Signed)
Cristina Gong, RN  Rehab Admission Coordinator  Physical Medicine and Rehabilitation  PMR Pre-admission  Signed  Date of Service:  09/09/2017 4:05 PM       Related encounter: ED to Hosp-Admission (Discharged) from 08/17/2017 in [place]           [] Hide copied text  [] H[place] for details   PMR Admission Coordinator Pre-Admission Assessment  Patient: Cassie [name] is an 52 y.o., female MRN: [id number] DOB: 15-Jul-1966 Height: 5\' 4"  (162.6 cm) Weight: 101.7 kg (224 lb 3.3 oz)                                                                                                                                                  Insurance Information  PRIMARY: uninsured       Medicaid Application Date:       Case Manager:  Disability Application Date:       Case Worker:   Emergency Tax adviser Information    Name Relation Home Work Mobile   Pierre Part Significant other [phone]       Current Medical History  Patient Admitting Diagnosis: Debility  History of Present Illness:  GBT:DVVOHYWVP Gardneris a 52 y.o.right handed femalewith history of hypothyroid, obesity, recent travel to [place] on vacation.Admitted on 08/17/17 with acute hypoxic respiratory failure due to severe influenza A superimposed on pneumonia. She was treated with IVIG X 2 v/s placebo for influenza A phase two study. She continued to have fevers and was treated with antibiotics, Tamiflu as well as IV lasix but developed ARDS with septic shock requiring intubation. She had difficulty with extubation attempts due to stridor requiring tracheostomy on 2/8. She was extubated to ATC and downsized to#4Cuffless02/15/2019 with and then to capon Monday, 09/12/2017 and consider decannulation 09/13/2017.Diet has been advanced to dysphagia #3 nectar liquids.Subcutaneous Lovenox for DVT prophylaxis.  Past Medical History        Past Medical History:  Diagnosis Date  . Hypothyroidism     Family History  family history includes Kidney disease in her father.  Prior Rehab/Hospitalizations:  Has the patient had major surgery during 100 days prior to admission? No  Current Medications   Current Facility-Administered Medications:  .  0.9 %  sodium chloride infusion, , Intravenous, Continuous, Byrum, Rose Fillers, MD, Last Rate: 10 mL/hr at 09/03/17 0700 .  acetaminophen (TYLENOL) solution 650 mg, 650 mg, Per Tube, Q6H PRN, Rush Farmer, MD, 650 mg at 09/03/17 7106 .  albuterol (PROVENTIL) (2.5 MG/3ML) 0.083% nebulizer solution 2.5 mg, 2.5 mg, Nebulization, TID, Rush Farmer, MD, 2.5 mg at 09/09/17 0734 .  amLODipine (NORVASC) tablet 10 mg, 10 mg, Oral, Daily, Regalado, Belkys A, MD, 10 mg at 09/09/17 0955 .  bisacodyl (DULCOLAX) suppository 10 mg,  10 mg, Rectal, Daily, Kathi Ludwig, MD, 10 mg at 09/03/17 0930 .  docusate (COLACE) 50 MG/5ML liquid 100 mg, 100 mg, Per Tube, BID, Rush Farmer, MD, 100 mg at 09/09/17 0955 .  enoxaparin (LOVENOX) injection 50 mg, 50 mg, Subcutaneous, Q24H, Rosita Fire, MD, 50 mg at 09/09/17 1255 .  feeding supplement (JEVITY 1.5 CAL/FIBER) liquid 1,000 mL, 1,000 mL, Per Tube, Q24H, Regalado, Belkys A, MD .  Derrill Memo ON 09/10/2017] feeding supplement (PRO-STAT SUGAR FREE 64) liquid 30 mL, 30 mL, Per Tube, Daily, Regalado, Belkys A, MD .  free water 250 mL, 250 mL, Per Tube, Q4H, Rush Farmer, MD, 250 mL at 09/09/17 1300 .  hydrALAZINE (APRESOLINE) injection 10 mg, 10 mg, Intravenous, Q6H PRN, Katherine Roan, MD, 10 mg at 09/06/17 1752 .  insulin aspart (novoLOG) injection 0-15 Units, 0-15 Units, Subcutaneous, Q4H, Simonne Maffucci B, MD, 2 Units at 09/09/17 1255 .  levalbuterol (XOPENEX) nebulizer solution 1.25 mg, 1.25 mg, Nebulization, Q6H PRN, Ivor Costa, MD, 1.25 mg at 08/24/17 1455 .  MEDLINE mouth rinse, 15 mL, Mouth Rinse, QID, Rush Farmer, MD, 15  mL at 09/08/17 0402 .  multivitamin liquid 15 mL, 15 mL, Oral, Daily, Oretha Milch D, MD, 15 mL at 09/09/17 0955 .  ondansetron (ZOFRAN) injection 4 mg, 4 mg, Intravenous, Q8H PRN, Ivor Costa, MD .  potassium chloride (KLOR-CON) packet 40 mEq, 40 mEq, Oral, Once, Regalado, Belkys A, MD .  RESOURCE THICKENUP CLEAR, , Oral, PRN, Regalado, Belkys A, MD .  sodium chloride flush (NS) 0.9 % injection 10-40 mL, 10-40 mL, Intracatheter, Q12H, McQuaid, Douglas B, MD, 10 mL at 09/09/17 1000 .  sodium chloride flush (NS) 0.9 % injection 10-40 mL, 10-40 mL, Intracatheter, PRN, Juanito Doom, MD  Patients Current Diet: DIET DYS 3 Room service appropriate? 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 None of the above  Prior Functional Level Prior Function Level of Independence: Independent  Self Care: Did the patient need help bathing, dressing, using the toilet or eating?  Independent  Indoor Mobility: Did the patient need assistance with walking from room to room (with or without device)? Independent  Stairs: Did the patient need assistance with internal or external stairs (with or without device)? Independent  Functional Cognition: Did the patient need help planning regular tasks such as shopping or remembering to take medications? Independent  Current Functional Level Cognition  Overall Cognitive Status: Difficult to assess Difficult to assess due to:  Tracheostomy Orientation Level: Oriented X4 General Comments: Appears intact    Extremity Assessment (includes Sensation/Coordination)  Upper Extremity Assessment: Generalized weakness(able to hold pen to write)  Lower Extremity Assessment: Generalized weakness    ADLs  Overall ADL's : Needs assistance/impaired Eating/Feeding: NPO Grooming: Set up, Sitting Upper Body Bathing: Moderate assistance, Sitting Lower Body Bathing: Moderate assistance, Sit to/from stand Upper Body Dressing : Moderate assistance Lower Body Dressing: Moderate assistance, Sit to/from stand Toilet Transfer: Moderate assistance, Stand-pivot Toilet Transfer Details (indicate cue type and reason): mod A for initial boost,  assist for balance with pivotal steps to recliner Toileting- Clothing Manipulation and Hygiene: Moderate assistance, Sit to/from stand Functional mobility during ADLs: Moderate assistance General ADL Comments: HHA for balance only    Mobility  Overal bed mobility: Needs Assistance Bed Mobility: Supine to Sit Supine to sit: HOB elevated, Min guard General bed mobility comments: min guard for safety    Transfers  Overall transfer level: Needs assistance Equipment used: Rolling walker (2 wheeled) Transfers: Sit to/from Stand Sit to Stand: Min assist Stand pivot transfers: Mod assist General transfer comment: assist to steady upon standing; 2 attempts to stand prior to achieving standing    Ambulation / Gait / Stairs / Wheelchair Mobility  Ambulation/Gait Ambulation/Gait assistance: Museum/gallery curator (Feet): 150 Feet Assistive device: Rolling walker (2 wheeled) Gait Pattern/deviations: Step-through pattern, Decreased stride length, Drifts right/left General Gait Details: assist for balance and managing RW at times especially with turning; pt with increased unsteadiness with horizontal head turns Gait velocity: decr Gait velocity interpretation: Below normal speed for  age/gender    Posture / Balance Balance Overall balance assessment: Needs assistance Sitting-balance support: No upper extremity supported, Feet supported Sitting balance-Leahy Scale: Good Standing balance support: Single extremity supported, During functional activity Standing balance-Leahy Scale: Poor Standing balance comment: dependent on external support    Special needs/care consideration BiPAP/CPAP  N/a CPM  N/a Continuous Drip IV  N/a Dialysis  N/a Life Vest  N/a Oxygen 28 % trach collar Special Bed  N/a Trach Size switched to #4 uncuffed on 2/15; use of PMSV with supervision Wound Vac n/a Skin ecchymosis to bilateral upper extremities Bowel mgmt: continent LBM 09/08/17 Bladder mgmt: continent Diabetic mgmt n/a   Previous Home Environment Living Arrangements: Spouse/significant other(lives with fiance, Josh)  Lives With: Significant other Available Help at Discharge: Family, Available 24 hours/day(Josh avaialble as needed) Type of Home: House Home Layout: Two level Alternate Level Stairs-Number of Steps: flight Home Access: Stairs to enter Bathroom Shower/Tub: Chiropodist: Standard Bathroom Accessibility: Yes How Accessible: Accessible via walker Home Care Services: No Additional Comments: Has home in [place] and also 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) Contact Information: Josh, fiance Anticipated Caregiver: Josh Anticipated Caregiver's Contact Information: [phone] Ability/Limitations of Caregiver: Merrily Pew also manages their buisness Caregiver Availability: 24/7 Discharge Plan Discussed with Primary Caregiver: Yes Is Caregiver In Agreement with Plan?: Yes Does Caregiver/Family have Issues with Lodging/Transportation while Pt is in Rehab?: No  Goals/Additional Needs Patient/Family Goal for Rehab: Mod I to supervision with PT, OT, and SLP Expected length of stay: ELOS 10 to 14 days Pt/Family Agrees to Admission and willing to participate: Yes Program Orientation Provided & Reviewed with Pt/Caregiver Including Roles  & Responsibilities: Yes  Decrease burden of Care through IP rehab admission: n/a  Possible need for SNF placement upon discharge:not anticipated  Patient Condition: This patient's medical and functional status has changed since the consult dated: 09/07/2017 in which the Rehabilitation Physician determined and documented that the patient's condition is appropriate for intensive rehabilitative care in an inpatient rehabilitation facility.  See "History of Present Illness" (above) for medical update. Functional changes are: min to mod assist. Patient's medical and functional status update has been discussed with the Rehabilitation physician and patient remains appropriate for inpatient rehabilitation. Will admit to inpatient rehab on Saturday 09/10/2017 when bed is available.  Preadmission Screen Completed By:  Cleatrice Burke, 09/09/2017 4:05 PM ______________________________________________________________________   Discussed status with Dr. Naaman Plummer on 09/09/2017 at 1611 and received telephone approval for admission on Saturday when bed is available 09/10/2017  Admission Coordinator:  Cleatrice Burke, time 9450 Date 09/09/2017.             Cosigned by: Meredith Staggers, MD at 09/10/2017 8:55 AM   Revision History

## 2017-09-10 NOTE — H&P (Signed)
Physical Medicine and Rehabilitation Admission H&P       Chief Complaint  Patient presents with  . Pneumonia  : HPI: Cassie [name] a 52 y.o. right handed femalewith history of hypothyroid, obesity, recent travel. Per chart review patient lives with spouse. Independent prior to admission.  Admitted on 08/17/17 with acute hypoxic respiratory failure due to severe influenza A superimposed on pneumonia. History taken from chart review.She was treated with IVIG X 2 v/s placebo for influenza A phase two study. She continued to have fevers and was treated with antibiotics, Tamiflu as well as IV lasix but developed ARDS with septic shock requiring intubation. She had difficulty with extubation attempts due to stridor requiring tracheostomy on 2/8. She was extubated to ATC and downsized to #4 Cuffless 09/09/2017 with and then to cap on Monday, 09/12/2017 and consider decannulation 09/13/2017.  Diet has been advanced to dysphagia #3 nectar liquids. Subcutaneous Lovenox for DVT prophylaxis. PT and OT evaluations completed and CIR recommended due to debility. Patient was admitted for a comprehensive rehabilitation program    Review of Systems  Constitutional: Positive for fever.  HENT: Negative for hearing loss.   Eyes: Negative for blurred vision.  Respiratory: Positive for cough and shortness of breath.   Cardiovascular: Positive for leg swelling.  Gastrointestinal: Positive for constipation. Negative for nausea and vomiting.  Genitourinary: Negative for dysuria, flank pain and hematuria.  Musculoskeletal: Positive for myalgias.  Skin: Negative for rash.  Neurological: Positive for headaches. Negative for seizures.  All other systems reviewed and are negative.      Past Medical History:  Diagnosis Date  . Hypothyroidism         Past Surgical History:  Procedure Laterality Date  . DENTAL SURGERY          Family History  Problem Relation Age of Onset  . Kidney  disease Father    Social History:  reports that  has never smoked. she has never used smokeless tobacco. She reports that she does not drink alcohol or use drugs. Allergies:      Allergies  Allergen Reactions  . Sulfa Antibiotics          Medications Prior to Admission  Medication Sig Dispense Refill  . Aspirin-Salicylamide-Caffeine (BC HEADACHE POWDER PO) Take 1 Package by mouth as needed (pain).    . Diphenhydramine-PE-APAP (DELSYM COUGH/COLD NIGHT TIME PO) Take 5 mLs by mouth as needed (cough).    . Ginger Root POWD Take 2.5 mLs by mouth daily. Dissolve in tea    . Kava, Piper methysticum, (KAVA KAVA ROOT PO) Take 2.5 mLs by mouth daily. Dissolve in tea    . pseudoephedrine (SUDAFED) 120 MG 12 hr tablet Take 120 mg by mouth daily as needed for congestion.    Marland Kitchen thyroid (ARMOUR) 90 MG tablet Take 90 mg by mouth daily.    . Turmeric 400 MG CAPS Take by mouth.      Drug Regimen Review Drug regimen was reviewed and remains appropriate with no significant issues identified  Home: Home Living Family/patient expects to be discharged to:: Private residence Living Arrangements: Spouse/significant other Available Help at Discharge: Family Type of Home: House Home Layout: Two level Alternate Level Stairs-Number of Steps: flight Bathroom Shower/Tub: Chiropodist: Standard Home Equipment: None Additional Comments: Difficult to get specifics due to trach   Functional History: Prior Function Level of Independence: Independent  Functional Status:  Mobility: Bed Mobility Overal bed mobility: Needs Assistance Bed Mobility: Supine to Sit  Supine to sit: Min assist, HOB elevated General bed mobility comments: Assist to elevate trunk into sitting. Transfers Overall transfer level: Needs assistance Equipment used: None Transfers: Sit to/from Stand, Stand Pivot Transfers Sit to Stand: Mod assist Stand pivot transfers: Mod assist General  transfer comment: Assist for balance and to bring hips up. Used HHA to take pivotal steps bed to recliner Ambulation/Gait Ambulation/Gait assistance: Min assist, +2 physical assistance Ambulation Distance (Feet): 60 Feet Assistive device: Rolling walker (2 wheeled) Gait Pattern/deviations: Step-through pattern, Decreased stride length, Trunk flexed, Wide base of support General Gait Details: Assist for balance and support. Amb on 28% trach collar with SpO2 >90% and HR to 140's.  Gait velocity: decr Gait velocity interpretation: Below normal speed for age/gender  ADL: ADL Overall ADL's : Needs assistance/impaired Eating/Feeding: NPO Grooming: Set up, Sitting Upper Body Bathing: Moderate assistance, Sitting Lower Body Bathing: Moderate assistance, Sit to/from stand Upper Body Dressing : Moderate assistance Lower Body Dressing: Moderate assistance, Sit to/from stand Toilet Transfer: Moderate assistance, Buyer, retail Details (indicate cue type and reason): mod A for initial boost, assist for balance with pivotal steps to recliner Toileting- Clothing Manipulation and Hygiene: Moderate assistance, Sit to/from stand Functional mobility during ADLs: Moderate assistance General ADL Comments: HHA for balance only  Cognition: Cognition Overall Cognitive Status: Difficult to assess Orientation Level: Oriented X4 Cognition Arousal/Alertness: Awake/alert Behavior During Therapy: WFL for tasks assessed/performed Overall Cognitive Status: Difficult to assess General Comments: Appears intact Difficult to assess due to: Tracheostomy  Physical Exam: Blood pressure (!) 156/101, pulse 100, temperature 98.1 F (36.7 C), temperature source Oral, resp. rate (!) 21, height '5\' 4"'  (1.626 m), weight 95.6 kg (210 lb 12.2 oz), last menstrual period 08/17/2017, SpO2 95 %. Physical Exam  Vitals reviewed. HENT:  Head: Normocephalic.  Eyes: EOM are normal. Right eye exhibits no discharge.  Left eye exhibits no discharge.  Neck:  Trach tube in place. #4 with PMV.  Poor phonation  Cardiovascular: Normal rate and regular rhythm. Exam reveals no friction rub.  No murmur heard. Respiratory: No respiratory distress. She has no wheezes. She has no rales.  Fair inspiratory effort without wheeze  GI: Soft. Bowel sounds are normal. She exhibits no distension.  Musculoskeletal: She exhibits no edema, tenderness or deformity.  Psychiatric: She has a normal mood and affect. Her behavior is normal. Thought content normal.  Skin. Warm and dry Musculoskeletal: No edema or tenderness in extremities Neurological: She isalert. Motor: 4/5 UE prox to distal. LE: 3/5 HF and KE and 4/5 prox to distal. No sensory findings. Normal cognition, DTR's 1+, sensation normal Psych: pleasant and appropriate   LabResultsLast48Hours  Results for orders placed or performed during the hospital encounter of 08/17/17 (from the past 48 hour(s))  Glucose, capillary     Status: Abnormal   Collection Time: 09/06/17  4:28 PM  Result Value Ref Range   Glucose-Capillary 143 (H) 65 - 99 mg/dL   Comment 1 Notify RN    Comment 2 Document in Chart   Glucose, capillary     Status: Abnormal   Collection Time: 09/06/17  7:54 PM  Result Value Ref Range   Glucose-Capillary 149 (H) 65 - 99 mg/dL   Comment 1 Notify RN    Comment 2 Document in Chart   Glucose, capillary     Status: Abnormal   Collection Time: 09/06/17 11:34 PM  Result Value Ref Range   Glucose-Capillary 152 (H) 65 - 99 mg/dL   Comment 1 Notify RN  Comment 2 Document in Chart   Glucose, capillary     Status: Abnormal   Collection Time: 09/07/17  3:38 AM  Result Value Ref Range   Glucose-Capillary 146 (H) 65 - 99 mg/dL   Comment 1 Notify RN    Comment 2 Document in Chart   Basic metabolic panel     Status: Abnormal   Collection Time: 09/07/17  4:26 AM  Result Value Ref Range   Sodium 150 (H) 135 - 145 mmol/L     Potassium 3.2 (L) 3.5 - 5.1 mmol/L   Chloride 113 (H) 101 - 111 mmol/L   CO2 25 22 - 32 mmol/L   Glucose, Bld 143 (H) 65 - 99 mg/dL   BUN 32 (H) 6 - 20 mg/dL   Creatinine, Ser 0.67 0.44 - 1.00 mg/dL   Calcium 9.5 8.9 - 10.3 mg/dL   GFR calc non Af Amer >60 >60 mL/min   GFR calc Af Amer >60 >60 mL/min    Comment: (NOTE) The eGFR has been calculated using the CKD EPI equation. This calculation has not been validated in all clinical situations. eGFR's persistently <60 mL/min signify possible Chronic Kidney Disease.    Anion gap 12 5 - 15    Comment: Performed at [facility] [address] [place], [place] [address]  Glucose, capillary     Status: Abnormal   Collection Time: 09/07/17  8:31 AM  Result Value Ref Range   Glucose-Capillary 145 (H) 65 - 99 mg/dL   Comment 1 Notify RN    Comment 2 Document in Chart   Glucose, capillary     Status: Abnormal   Collection Time: 09/07/17 12:35 PM  Result Value Ref Range   Glucose-Capillary 133 (H) 65 - 99 mg/dL   Comment 1 Notify RN    Comment 2 Document in Chart   Glucose, capillary     Status: Abnormal   Collection Time: 09/07/17  4:28 PM  Result Value Ref Range   Glucose-Capillary 147 (H) 65 - 99 mg/dL  Glucose, capillary     Status: Abnormal   Collection Time: 09/07/17  8:03 PM  Result Value Ref Range   Glucose-Capillary 109 (H) 65 - 99 mg/dL   Comment 1 Notify RN    Comment 2 Document in Chart   Glucose, capillary     Status: Abnormal   Collection Time: 09/07/17 11:31 PM  Result Value Ref Range   Glucose-Capillary 161 (H) 65 - 99 mg/dL   Comment 1 Notify RN    Comment 2 Document in Chart   Basic metabolic panel     Status: Abnormal   Collection Time: 09/08/17  5:00 AM  Result Value Ref Range   Sodium 142 135 - 145 mmol/L   Potassium 2.9 (L) 3.5 - 5.1 mmol/L   Chloride 104 101 - 111 mmol/L   CO2 25 22 - 32 mmol/L   Glucose, Bld 161 (H) 65 - 99 mg/dL   BUN 23 (H) 6  - 20 mg/dL   Creatinine, Ser 0.57 0.44 - 1.00 mg/dL   Calcium 9.1 8.9 - 10.3 mg/dL   GFR calc non Af Amer >60 >60 mL/min   GFR calc Af Amer >60 >60 mL/min    Comment: (NOTE) The eGFR has been calculated using the CKD EPI equation. This calculation has not been validated in all clinical situations. eGFR's persistently <60 mL/min signify possible Chronic Kidney Disease.    Anion gap 13 5 - 15    Comment: Performed  at [facility], [place] [address] [place], [place] [address]  Glucose, capillary     Status: Abnormal   Collection Time: 09/08/17  5:21 AM  Result Value Ref Range   Glucose-Capillary 160 (H) 65 - 99 mg/dL  Glucose, capillary     Status: Abnormal   Collection Time: 09/08/17  8:05 AM  Result Value Ref Range   Glucose-Capillary 120 (H) 65 - 99 mg/dL   Comment 1 Notify RN    Comment 2 Document in Chart   Glucose, capillary     Status: Abnormal   Collection Time: 09/08/17 12:23 PM  Result Value Ref Range   Glucose-Capillary 144 (H) 65 - 99 mg/dL   Comment 1 Notify RN    Comment 2 Document in Chart      ImagingResults(Last48hours)  No results found.       Medical Problem List and Plan: 1.  Debility secondary to ARDS/VDRF related to influenza A             -admit to inpatient rehab 2.  DVT Prophylaxis/Anticoagulation: Subcutaneous Lovenox. Check vascular study 3. Pain Management: Tylenol as needed 4. Mood: Provide emotional support 5. Neuropsych: This patient is capable of making decisions on her own behalf. 6. Skin/Wound Care: Routine skin checks 7. Fluids/Electrolytes/Nutrition: Routine I&O's with follow-up chemistries upon admit             -remove PICC 8. Tracheostomy 09/02/2017. Presently with #4 CUFFLESS 09/09/2017. Plan to cap if tolerated 09/12/2017 and then consider decannulation 9. Dysphagia. Dysphagia #3 nectar thick liquids. Follow-up speech therapy. Tube feeds can be discontinued and monitor nutritional intake,  remove NGT 10. Hypertension. Norvasc 10 mg daily 11. Anemia due to critical illness. Follow-up CBC   Post Admission Physician Evaluation: 1. Functional deficits secondary  to debility. 2. Patient is admitted to receive collaborative, interdisciplinary care between the physiatrist, rehab nursing staff, and therapy team. 3. Patient's level of medical complexity and substantial therapy needs in context of that medical necessity cannot be provided at a lesser intensity of care such as a SNF. 4. Patient has experienced substantial functional loss from his/her baseline which was documented above under the "Functional History" and "Functional Status" headings.  Judging by the patient's diagnosis, physical exam, and functional history, the patient has potential for functional progress which will result in measurable gains while on inpatient rehab.  These gains will be of substantial and practical use upon discharge  in facilitating mobility and self-care at the household level. 5. Physiatrist will provide 24 hour management of medical needs as well as oversight of the therapy plan/treatment and provide guidance as appropriate regarding the interaction of the two. 6. The Preadmission Screening has been reviewed and patient status is unchanged unless otherwise stated above. 7. 24 hour rehab nursing will assist with bladder management, bowel management, safety, skin/wound care, disease management, medication administration, pain management and patient education  and help integrate therapy concepts, techniques,education, etc. 8. PT will assess and treat for/with: Lower extremity strength, range of motion, stamina, balance, functional mobility, safety, adaptive techniques and equipment, activity tolerance, family ed.   Goals are: mod I to supervision. 9. OT will assess and treat for/with: ADL's, functional mobility, safety, upper extremity strength, adaptive techniques and equipment, family ed, activity  tolerance.   Goals are: mod I to supervision. Therapy may proceed with showering this patient. 10. SLP will assess and treat for/with: speech/phonation, swallowing.  Goals are: mod I. 11. Case Management and Social Worker will assess and  treat for psychological issues and discharge planning. 12. Team conference will be held weekly to assess progress toward goals and to determine barriers to discharge. 13. Patient will receive at least 3 hours of therapy per day at least 5 days per week. 14. ELOS: 10-14 days       15. Prognosis:  excellent     Meredith Staggers, MD, [facility] 09/10/2017  Lavon Paganini Picacho, PA-C 09/08/2017

## 2017-09-10 NOTE — Progress Notes (Signed)
Jamse Arn, MD      Jamse Arn, MD  Physician  Physical Medicine and Rehabilitation      Consult Note  Signed     Date of Service:  09/07/2017  8:45 AM         Related encounter: ED to Hosp-Admission (Discharged) from 08/17/2017 in [place] All Collapse All            Expand widget buttonCollapse widget button     Hide copied text   Hover for detailscustomization button                                                                                                                                       untitled image              Physical Medicine and Rehabilitation Consult        Reason for Consult:  Debility  Referring Physician: Dr. Lonny Prude        HPI: Cassie [name] is a 52 y.o. female with history of hypothyroid, obesity, recent travel;  who was admitted on 08/17/17 with acute hypoxic respiratory failure due to severe influenza A superimposed on  pneumonia. History taken from chart review. She was treated with IVIG X 2 v/s placebo for influenza A phase two study. She continued to have fevers and was treated with antibiotics, Tamiflu as well as IV lasix but developed ARDS with septic shock requiring intubation. She had difficulty with extubation attempts due to stridor requiring tracheostomy on 2/8. She was extubated to ATC and downsized to #6 cuffed on 2/13. To start PMSV trials and NPO pending repeat MBS.  PT evaluation done yesterday and CIR recommended due to debility.           Review of Systems   Unable to perform ROS: Patient nonverbal   All other systems reviewed and are negative.                Past Medical History:    Diagnosis   Date    .   Hypothyroidism                     Past Surgical History:    Procedure   Laterality   Date    .   DENTAL SURGERY                        Family History    Problem   Relation   Age of Onset    .   Kidney disease   Father              Social  History:  reports that  has never smoked. she has never used smokeless tobacco. She reports that she does not drink alcohol or use drugs.              Allergies    Allergen   Reactions    .   Sulfa Antibiotics                     Medications Prior to Admission    Medication   Sig   Dispense   Refill    .   Aspirin-Salicylamide-Caffeine (BC HEADACHE POWDER PO)   Take 1 Package by mouth as needed (pain).            .   Diphenhydramine-PE-APAP (DELSYM COUGH/COLD NIGHT TIME PO)   Take 5 mLs by mouth as needed (cough).            .   Ginger Root POWD   Take 2.5 mLs by mouth daily. Dissolve in tea            .   Kava, Piper methysticum, (KAVA KAVA ROOT PO)   Take 2.5 mLs by mouth daily. Dissolve in tea            .   pseudoephedrine (SUDAFED) 120 MG 12 hr tablet   Take 120 mg by mouth daily as needed for congestion.            Marland Kitchen   thyroid (ARMOUR) 90 MG tablet   Take 90 mg by mouth daily.            .   Turmeric 400 MG CAPS   Take by mouth.                  Home:  Home Living  Family/patient expects to be discharged to:: Private residence  Living Arrangements: Spouse/significant other  Available Help at Discharge: Family  Home Layout: Two Laie: None  Additional Comments: Difficult to get specifics due to trach   Functional History:  Prior Function  Level of Independence: Independent  Functional Status:   Mobility:  Bed Mobility  Overal bed mobility: Needs Assistance  Bed Mobility: Supine to Sit  Supine to sit: Min assist, HOB elevated  General bed mobility comments: Assist to  elevate trunk into sitting.  Transfers  Overall transfer level: Needs assistance  Equipment used: Rolling walker (2 wheeled)  Transfers: Sit to/from Stand, Stand Pivot Transfers  Sit to Stand: Min assist, +2 safety/equipment  Stand pivot transfers: Min assist, +2 safety/equipment  General transfer comment: Assist for balance and to bring hips up. Used walker to take pivotal steps bed to recliner  Ambulation/Gait  Ambulation/Gait assistance: Min assist, +2 physical assistance  Ambulation Distance (Feet): 60 Feet  Assistive device: Rolling walker (2 wheeled)  Gait Pattern/deviations: Step-through pattern, Decreased stride length, Trunk flexed, Wide base of support  General Gait Details: Assist for balance and support. Amb on 28% trach collar with SpO2 >90% and HR to 140's.   Gait velocity: decr  Gait velocity interpretation: Below normal speed for age/gender       ADL:       Cognition:  Cognition  Overall Cognitive Status: Difficult to assess  Orientation Level: Oriented to person  Cognition  Arousal/Alertness: Awake/alert  Behavior During Therapy: WFL for tasks assessed/performed  Overall Cognitive Status: Difficult to assess  General Comments: Appears intact  Difficult to assess due to: Tracheostomy        Blood pressure Marland Kitchen)  176/118, pulse (!) 103, temperature 98.6 F (37 C), temperature source Oral, resp. rate 18, height 5\' 4"  (1.626 m), weight 95.2 kg (209 lb 14.1 oz), last menstrual period 08/17/2017, SpO2 98 %.  Physical Exam   Vitals reviewed.  Constitutional: She appears well-developed.  Obese   HENT:  Facial edema   Eyes: EOM are normal. Right eye exhibits no discharge. Left eye exhibits no discharge.   Neck:  + Trach   Cardiovascular: Regular rhythm.  + Tachycardia   Respiratory: Effort normal and breath sounds normal.   GI: Soft. Bowel sounds are normal.  Musculoskeletal:  No edema or tenderness in extremities   Neurological: She is alert.  Motor: 4/5 grossly throughout   Skin: Skin is warm and dry.  Psychiatric: She has a normal mood and affect. Her behavior is normal.         Lab Results Last 24 Hours  Imaging Results (Last 48 hours)          Assessment/Plan:  Diagnosis: Debility  Labs independently reviewed.  Records reviewed and summated above.     1.Does the need for close, 24 hr/day medical supervision in concert with the patient's rehab needs make it unreasonable for this patient to be served in a less intensive setting? Yes    2.Co-Morbidities requiring supervision/potential complications: NPO (advance diet as tolerated), tracheostomy status, ARDS with septic shock, hypothyroidism (cont meds, ensure appropriate mood and energy level for therapies), morbid obesity (Body mass index is 36.03 kg/m., diet and exercise education, encourage weight loss to increase endurance and promote overall health), Tachycardia (monitor in accordance with pain and increasing activity), tachypnea (monitor RR and O2 Sats with increased physical exertion), HTN (monitor and provide prns in accordance with  increased physical exertion and pain), prediabetes (Monitor in accordance with exercise and adjust meds as necessary), hypernatremia (cont to monitor, treat if necessary), hypokalemia (continue to monitor and replete as necessary), ABLA (transfuse if necessary to ensure appropriate perfusion for increased activity tolerance)   3.Due to safety, skin/wound care, disease management and patient education, does the patient require 24 hr/day rehab nursing? Yes   4.Does the patient require coordinated care of a physician, rehab nurse, PT (1-2 hrs/day, 5 days/week), OT (1-2 hrs/day, 5 days/week) and SLP (1-2 hrs/day, 5 days/week) to address physical and functional deficits in the context of the above medical diagnosis(es)? Yes Addressing deficits in the following areas: balance, endurance, locomotion, strength, transferring, bathing, dressing, feeding, grooming, toileting, speech, swallowing and psychosocial support   5.Can the patient actively participate in an intensive therapy program of at least 3 hrs of therapy per day at least 5 days per week? Potentially   6.The potential for patient to make measurable gains while on inpatient rehab is excellent   7.Anticipated functional outcomes upon discharge from inpatient rehab are modified independent and supervision  with PT, modified independent and supervision with OT, modified independent with SLP.   8.Estimated rehab length of stay to reach the above functional goals is: 16-19 days.   9.Anticipated D/C setting: Home   10.Anticipated post D/C treatments: HH therapy and Home excercise program   11.Overall Rehab/Functional Prognosis: excellent      RECOMMENDATIONS:  This patient's condition is appropriate for continued rehabilitative care in the following setting: CIR once medically stable  Patient has agreed to participate in recommended program. Yes  Note that insurance prior authorization may be required for reimbursement for  recommended care.     Comment: Rehab Admissions Coordinator to follow up.     Delice Lesch, MD, ABPMR  Bary Leriche, PA-C  09/07/2017                Revision History                                        Routing History

## 2017-09-10 NOTE — Evaluation (Signed)
Physical Therapy Assessment and Plan  Patient Details  Name: Cassie [name] MRN: [id number] Date of Birth: [birth year]  PT Diagnosis: Difficulty walking and Muscle weakness Rehab Potential: Excellent ELOS: 5-7 days    Today's Date: 09/10/2017 PT Individual Time: 1615-1710 PT Individual Time Calculation (min): 55 min    Problem List:  Patient Active Problem List   Diagnosis Date Noted  . Acute respiratory distress syndrome (ARDS) ([place]) 09/10/2017  . Debility 09/10/2017  . Tachycardia   . Tracheostomy in place [facility])   . Oropharyngeal dysphagia   . Morbid obesity ([place])   . Tachypnea   . Reactive hypertension   . Prediabetes   . Hypokalemia   . Acute blood loss anemia   . Hyperglycemia 09/06/2017  . Sinus tachycardia 09/06/2017  . Hypernatremia 09/06/2017  . Acute respiratory failure with hypoxemia ([place])   . Tracheostomy status ([place])   . Transaminitis 08/19/2017  . Electrolyte imbalance 08/19/2017  . Influenza B   . ARDS (adult respiratory distress syndrome) ([place])   . Influenza A 08/18/2017  . Acute lung injury 08/18/2017  . Multifocal pneumonia 08/17/2017  . Acute respiratory failure ([place]) 08/17/2017  . Sepsis ([place]) 08/17/2017  . Hypothyroidism     Past Medical History:  Past Medical History:  Diagnosis Date  . Hypothyroidism    Past Surgical History:  Past Surgical History:  Procedure Laterality Date  . DENTAL SURGERY      Assessment & Plan Clinical Impression: Patient is a 52 y.o.right handed femalewith history of hypothyroid, obesity, recent travel.Per chart review patient lives with spouse. Independent prior to admission. Admitted on 08/17/17 with acute hypoxic respiratory failure due to severe influenza A superimposed on pneumonia. History taken from chart review.She was treated with IVIG X 2 v/s placebo for influenza A phase two study. She continued to have fevers and was treated with antibiotics, Tamiflu as well as IV lasix but developed ARDS with  septic shock requiring intubation. She had difficulty with extubation attempts due to stridor requiring tracheostomy on 2/8. She was extubated to ATC and downsized to#[id number]/15/2019 with and then to capon Monday, 09/12/2017 and consider decannulation 09/13/2017.  Patient transferred to CIR on 09/10/2017 .   Patient currently requires min with mobility secondary to muscle weakness, decreased cardiorespiratoy endurance and decreased oxygen support and decreased standing balance and decreased balance strategies.  Prior to hospitalization, patient was independent  with mobility and lived with Significant other in a [place] home.  Home access is 13Stairs to enter.  Patient will benefit from skilled PT intervention to maximize safe functional mobility, minimize fall risk and decrease caregiver burden for planned discharge home with intermittent assist.  Anticipate patient will benefit from follow up OP at discharge.  PT - End of Session Activity Tolerance: Tolerates 10 - 20 min activity with multiple rests Endurance Deficit: Yes PT Assessment Rehab Potential (ACUTE/IP ONLY): Excellent PT Barriers to Discharge: Inaccessible home environment;Decreased caregiver support;Medical stability;Trach PT Patient demonstrates impairments in the following area(s): Balance;Edema;Endurance;Motor;Nutrition;Pain;Safety;Skin Integrity PT Transfers Functional Problem(s): Bed Mobility;Bed to Chair;Car;Furniture;Floor PT Locomotion Functional Problem(s): Ambulation;Wheelchair Mobility;Stairs PT Plan PT Intensity: Minimum of 1-2 x/day ,45 to 90 minutes PT Frequency: 5 out of 7 days PT Duration Estimated Length of Stay: 5-7 days  PT Treatment/Interventions: Ambulation/gait training;Balance/vestibular training;Cognitive remediation/compensation;Community reintegration;Discharge planning;Disease management/prevention;DME/adaptive equipment instruction;Functional mobility training;Functional electrical  stimulation;Neuromuscular re-education;Pain management;Patient/family education;Psychosocial support;Skin care/wound management;Stair training;Therapeutic Activities;Therapeutic Exercise;UE/LE Strength taining/ROM;UE/LE Coordination activities;Visual/perceptual remediation/compensation;Wheelchair propulsion/positioning PT Transfers Anticipated Outcome(s): Mod I with LRAD  PT Locomotion Anticipated Outcome(s): Mod I with  LRAD for household distances.  PT Recommendation Follow Up Recommendations: Outpatient PT Patient destination: Home Equipment Recommended: To be determined;Cane  Skilled Therapeutic Intervention Pt received supine in bed and agreeable to PT. Supine>sit transfer with supervision assist. PT instructed patient in PT Evaluation and initiated treatment intervention; see below for results. PT educated patient in [place], rehab potential, rehab goals, and discharge recommendations. Min assist gait training up to 139f as list below as well as min assist transfers with stand pivot technique as indicated. Pt returned to room and performed ambulatory transfer to bed with min assist. Sit>supine completed with supervision assist, and  Pt left supine in bed with call bell in reach and all needs met.       PT Evaluation Precautions/Restrictions   General   Vital SignsTherapy Vitals Pulse Rate: 92 Resp: 18 Patient Position (if appropriate): Lying Oxygen Therapy SpO2: 96 % O2 Device: Not Delivered Pain   denies  Home Living/Prior Functioning Home Living Available Help at Discharge: Family;Available 24 hours/day(Josh avaialble as needed) Type of Home: Apartment Home Access: Stairs to enter Entrance Stairs-Number of Steps: 13 Entrance Stairs-Rails: Right Home Layout: One level Bathroom Shower/Tub: TResearch officer, trade unionAccessibility: Yes Additional Comments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  Vision/Perception  Perception Perception: Within Functional Limits Praxis Praxis: Intact  Cognition Overall Cognitive Status: Within Functional Limits for tasks assessed Orientation Level: Oriented X4 Memory: Appears intact Awareness: Appears intact Problem Solving: Appears intact Safety/Judgment: Appears intact Sensation Sensation Light Touch: Appears Intact Proprioception: Appears Intact Coordination Gross Motor Movements are Fluid and Coordinated: Yes Fine Motor Movements are Fluid and Coordinated: Yes Finger Nose Finger Test: [facility]Heel Shin Test: [facility] Motor  Motor Motor: Within Functional Limits Motor - Skilled Clinical Observations: generalized weakness  Mobility Bed Mobility Bed Mobility: Rolling Right;Rolling Left;Supine to Sit;Sit to Supine Rolling Right: 5: Supervision Rolling Left: 5: Supervision Right Sidelying to Sit: 5: Supervision Supine to Sit: 5: Supervision Sit to Supine: 5: Supervision Transfers Transfers: Yes Sit to Stand: 4: Min assist Stand Pivot Transfers: 4: Min aPresenter, broadcastingtransfer: min assist to truck height  Locomotion  Ambulation Ambulation: Yes Ambulation/Gait Assistance: 4: Min assist Ambulation Distance (Feet): 170 Feet Assistive device: None Gait Gait: Yes Gait Pattern: Wide base of support;Step-through pattern;Decreased stride length Stairs / Additional Locomotion Stairs: Yes Stairs Assistance: 4: Min guard Stair Management Technique: Two rails Number of Stairs: 8 Height of Stairs: 6 Ramp: 4: Min assist Curb: 4: Min assist(UE support on rail ) WProduct managerMobility: No  Gait over Unlevel surface: min assist. No AD  Trunk/Postural  Assessment  Cervical Assessment Cervical Assessment: Within Functional Limits Thoracic Assessment Thoracic Assessment: Within Functional Limits Lumbar Assessment Lumbar Assessment: Within Functional Limits Postural Control Postural Control: Within Functional Limits  Balance Balance Balance Assessed: Yes Static Sitting Balance Static Sitting - Level of Assistance: 6: Modified independent (Device/Increase time) Dynamic Sitting Balance Dynamic Sitting - Level of Assistance: 5: Stand by assistance Static Standing Balance Static Standing - Level of Assistance: 5: Stand by assistance Dynamic Standing Balance Dynamic Standing - Level of Assistance: 4: Min assist Extremity Assessment      RLE Assessment RLE Assessment: Within Functional Limits(grossly 4+/5 to 5/5) LLE  Assessment LLE Assessment: Within Functional Limits(grossly 4+/5 to 5/5)   See Function Navigator for Current Functional Status.   Refer to Care Plan for Long Term Goals  Recommendations for other services: Neuropsych and Therapeutic Recreation  Outing/community reintegration  Discharge Criteria: Patient will be discharged from PT if patient refuses treatment 3 consecutive times without medical reason, if treatment goals not met, if there is a change in medical status, if patient makes no progress towards goals or if patient is discharged from hospital.  The above assessment, treatment plan, treatment alternatives and goals were discussed and mutually agreed upon: by patient and by family  Lorie Phenix 09/10/2017, 5:44 PM

## 2017-09-10 NOTE — Discharge Summary (Signed)
Physician Discharge Summary  Cassie [name] QAS:[id number] DOB: 1965-11-13 DOA: 08/17/2017  PCP: Patient, No Pcp Per  Admit date: 08/17/2017 Discharge date: 09/10/2017  Admitted From: Home  Disposition:  Transfer to CIR  Recommendations for Outpatient Follow-up:  1. Needs Pulmonologist evaluation for trach.  2. Continue speech therapy.  3. Monitor oral intake, follow electrolytes.,     Discharge Condition: Stable.  CODE STATUS: full code.  Diet recommendation: Dysphagia 3 diet   Brief/Interim Summary: HPI/Recap of past 24 hours:  Cassie [name] is a 52 y.o. year old female with medical history significant for hypothyroidism on Armour who presented on 08/17/2017 with cough and shortness of breath and was found to have acute hypoxic respiratory failure secondary to oriented in the setting of severe influenza A pneumonia with presumed superimposed bacterial pneumonia. Has  had a long s, complicated hospital course previously requiring pressor support for septic shock and AKI. she was intubated for respiratory support on 1/25, she failed extubation due to upper airway obstruction and stridor.  Tracheostomy was placed on 2/8.  After tolerating trach collar patient was transferred to stepdown unit on 2/11.  SUBJECTIVE She is sitting in the chair. She is able to speak now. Wants NG tube removed. She can not eat with the tube.  Denies dyspnea.   Assessment/Plan: Principal Problem:   Multifocal pneumonia Active Problems:   Acute respiratory failure (HCC)   Sepsis (HCC)   Hypothyroidism   Influenza A   Acute lung injury   Transaminitis   Electrolyte imbalance   Influenza B   ARDS (adult respiratory distress syndrome) (HCC)   Hyperglycemia   Sinus tachycardia   Hypernatremia   Acute respiratory failure with hypoxemia (HCC)   Tracheostomy status (HCC)   Tachycardia   Tracheostomy in place (HCC)   Oropharyngeal dysphagia   Morbid obesity (HCC)   Tachypnea   Reactive  hypertension   Prediabetes   Hypokalemia   Acute blood loss anemia    Acute hypoxic respiratory failure, improving -ARDS secondary to CAP and influenza A pneumonia, resolved -On 5 L via trach -Chest x-ray from 2/11 shows no new focal infiltrate, stable -Status post completion ofTamiflu, broad-spectrum empiric antibiotics regimen on 2/2 -Pulm following for trach.  - Chedule albuterol nebs TID, as needed Xopenex -Change to cuffless 6 on 2-13----on 2-14 change to scuffles 4. May de cannulate next week.  -Dr Lamonte Sakai  informed of patient transfer to CIR, they will follow up on patient at [facility] on Monday.   Hypovolemic hyponatremia sodium decreasing today at 150.  Likely in setting of decreased oral intake since depending on NG tube and previous IV diuresis for volume overload - Hold off on further diuresis Resolved.   Hypertension Range 166/101- 181/84 in the last 24 hours. No home blood pressure meds - As needed Hydralazine -continue with Norvasc.   Sinus tachycardia Likely multifactorial etiology: Dehydration and increased thyroid hormone TSH suppressed and T4 high limit of normal Thyroid function could be related to critical illness Unclear amount of thyroid hormone in Armour -Advise discontinue Armour for now, consider Synthroid replacement deemed necessary -Free water flushes as mentioned above -needs repeat TSH and free T 4, Free T 3.  Improved.   Hypothyroidism;  Repeat TSH and free T 3 , T 4. To consider resume amodour.  await Free T 3 results. TSH and free T 4 normal. Might be able to resume low dose amodour.   Pharyngeal dysphagia N.p.o. for now Tube feeds via NG tube, patient would like NG  removed, is difficult to eat with tube.  Speech therapy following  Hyperglycemia No history of diabetes -Obtain A1c -Continue sliding scale insulin, monitor blood glucose levels continue SSI at CIR>   Anemia Likely due to critical illness Stable at 10 No signs or  symptoms of bleeding -Monitor on CBC  Hypokalemia;  Replete orally.  Follow B-met today   Septic shock, resolved Previously required pressors during day NICU.  For some time      Discharge Diagnoses:  Principal Problem:   Multifocal pneumonia Active Problems:   Acute respiratory failure (HCC)   Sepsis ([place])   Hypothyroidism   Influenza A   Acute lung injury   Transaminitis   Electrolyte imbalance   Influenza B   ARDS (adult respiratory distress syndrome) (HCC)   Hyperglycemia   Sinus tachycardia   Hypernatremia   Acute respiratory failure with hypoxemia (HCC)   Tracheostomy status (HCC)   Tachycardia   Tracheostomy in place [facility])   Oropharyngeal dysphagia   Morbid obesity (HCC)   Tachypnea   Reactive hypertension   Prediabetes   Hypokalemia   Acute blood loss anemia    Discharge Instructions   Allergies as of 09/10/2017      Reactions   Sulfa Antibiotics       Medication List    STOP taking these medications   BC HEADACHE POWDER PO   DELSYM COUGH/COLD NIGHT TIME PO   Ginger Root Powd   KAVA KAVA ROOT PO   pseudoephedrine 120 MG 12 hr tablet Commonly known as:  SUDAFED   thyroid 90 MG tablet Commonly known as:  ARMOUR   Turmeric 400 MG Caps     TAKE these medications   albuterol (2.5 MG/3ML) 0.083% nebulizer solution Commonly known as:  PROVENTIL Take 3 mLs (2.5 mg total) by nebulization 3 (three) times daily.   amLODipine 10 MG tablet Commonly known as:  NORVASC Take 1 tablet (10 mg total) by mouth daily.   docusate 50 MG/5ML liquid Commonly known as:  COLACE Place 10 mLs (100 mg total) into feeding tube 2 (two) times daily.   feeding supplement (JEVITY 1.5 CAL/FIBER) Liqd Place 1,000 mLs into feeding tube daily.   feeding supplement (PRO-STAT SUGAR FREE 64) Liqd Take 30 mLs by mouth 3 (three) times daily.   multivitamin Liqd Take 15 mLs by mouth daily.   potassium chloride SA 20 MEQ tablet Commonly known as:   K-DUR,KLOR-CON Take 2 tablets (40 mEq total) by mouth every 4 (four) hours.       Allergies  Allergen Reactions  . Sulfa Antibiotics     Consultations:  CCM   Procedures/Studies: Dg Chest 2 View  Result Date: 08/17/2017 CLINICAL DATA:  52 year old female with a history of pneumonia. Productive cough. EXAM: CHEST  2 VIEW COMPARISON:  None. FINDINGS: Cardiomediastinal silhouette obscured by overlying lung/pleural disease. Low lung volumes with patchy and linear opacity of the bilateral lungs, left greater than right. No pleural effusion.  No pneumothorax. IMPRESSION: Mixed airspace and interstitial opacities of the bilateral lungs, left greater than right. Given the history, findings most likely represent multifocal pneumonia, however, pulmonary edema could have this appearance. Electronically Signed   By: Corrie Mckusick D.O.   On: 08/17/2017 19:17   Dg Chest Port 1 View  Result Date: 09/05/2017 CLINICAL DATA:  Cough and shortness of Breath EXAM: PORTABLE CHEST 1 VIEW COMPARISON:  09/02/2017 FINDINGS: Tracheostomy tube and feeding catheter are noted in satisfactory position. Right-sided PICC line is noted in satisfactory position.  The overall inspiratory effort is poor with crowding of the vascular markings. Bibasilar atelectatic changes are again seen. No new focal infiltrate is seen. No bony abnormality is noted. IMPRESSION: Stable bibasilar atelectatic changes Electronically Signed   By: Inez Catalina M.D.   On: 09/05/2017 08:39   Dg Chest Port 1 View  Result Date: 09/02/2017 CLINICAL DATA:  Status post tracheostomy EXAM: PORTABLE CHEST 1 VIEW COMPARISON:  Chest radiograph 09/02/2017 FINDINGS: Tracheostomy tube mid trachea. Enteric tube courses inferior to the diaphragm. Central venous catheter tip projects over the superior cavoatrial junction. Monitoring leads overlie the patient. Stable cardiomegaly. Grossly unchanged mid lower lung heterogeneous opacities. Probable small left pleural  effusion. IMPRESSION: Tracheostomy tube mid trachea. Otherwise stable exam. Electronically Signed   By: Lovey Newcomer M.D.   On: 09/02/2017 12:27   Portable Chest Xray  Result Date: 09/02/2017 CLINICAL DATA:  Respiratory distress EXAM: PORTABLE CHEST 1 VIEW COMPARISON:  09/01/2017 FINDINGS: Cardiac shadow is stable. Right-sided PICC line, endotracheal tube and nasogastric catheter are again noted and stable. Bibasilar atelectatic changes are again noted and stable. Stable vascular congestion is seen. IMPRESSION: No significant interval change from the prior exam. Electronically Signed   By: Inez Catalina M.D.   On: 09/02/2017 07:10   Dg Chest Port 1 View  Result Date: 09/01/2017 CLINICAL DATA:  Reintubated. EXAM: PORTABLE CHEST 1 VIEW COMPARISON:  09/01/2017 FINDINGS: Endotracheal tube with the tip 3.5 cm above the carina. Right-sided PICC line with the tip projecting over the SVC. Bilateral mild interstitial thickening. Bibasilar hazy airspace disease likely reflecting atelectasis. No pleural effusion or pneumothorax. Stable cardiomegaly. Nasogastric tube coursing below the diaphragm. No acute osseous abnormality. IMPRESSION: 1. Endotracheal tube with the tip 3.5 cm above the carina. 2. Cardiomegaly with pulmonary vascular congestion. Electronically Signed   By: Kathreen Devoid   On: 09/01/2017 11:03   Dg Chest Port 1 View  Result Date: 09/01/2017 CLINICAL DATA:  Ventilator dependence EXAM: PORTABLE CHEST 1 VIEW COMPARISON:  08/31/2017 FINDINGS: Endotracheal tube in good position. Central venous catheter tip at the cavoatrial junction unchanged. No pneumothorax. Cardiac enlargement. Bibasilar airspace disease left greater than right with mild interval improvement. Vascular congestion appears improved. NG tube enters the stomach. IMPRESSION: Endotracheal tube in satisfactory position. Bilateral airspace disease left greater than right with mild interval improvement. Electronically Signed   By: Franchot Gallo M.D.    On: 09/01/2017 07:03   Dg Chest Port 1 View  Result Date: 08/31/2017 CLINICAL DATA:  Ventilator dependent respiratory failure. Follow-up pneumonia. EXAM: PORTABLE CHEST 1 VIEW COMPARISON:  08/29/2017, 08/28/2017 and earlier. FINDINGS: Endotracheal tube tip in satisfactory position approximately 4 cm above the carina. Right arm PICC tip projects at or near the cavoatrial junction. Nasogastric tube courses below the diaphragm into the stomach. Suboptimal inspiration. Dense airspace consolidation with air bronchograms involving the left lower lobe. Stable moderate airspace opacities in the right lower lobe and right middle lobe. Stable moderate airspace opacities in the lingula. Mild pulmonary venous hypertension without overt edema. No new pulmonary parenchymal abnormalities. IMPRESSION: 1.  Support apparatus satisfactory. 2. Stable pneumonia in the lower lobes bilaterally, the right middle lobe and lingula, most confluent in the left lower lobe. 3. No new abnormalities. Electronically Signed   By: Evangeline Dakin M.D.   On: 08/31/2017 09:12   Dg Chest Port 1 View  Result Date: 08/29/2017 CLINICAL DATA:  Respiratory failure with shortness of breath EXAM: PORTABLE CHEST 1 VIEW COMPARISON:  Yesterday FINDINGS: Endotracheal tube tip between  the clavicular heads and carina. An orogastric tube reaches the stomach. Right IJ line with tip at the SVC. History of pneumonia with left more than right airspace disease. Left diaphragm is obscured. No edema, definite effusion, or pneumothorax. Cardiomegaly and vascular pedicle widening. IMPRESSION: 1. History of pneumonia/ARDS with unchanged left more than right airspace disease. 2. Stable low volumes are low. 3. Cardiomegaly. 4. Unremarkable hardware positioning. Electronically Signed   By: Monte Fantasia M.D.   On: 08/29/2017 06:48   Dg Chest Port 1 View  Result Date: 08/28/2017 CLINICAL DATA:  Pneumonia EXAM: PORTABLE CHEST 1 VIEW COMPARISON:  08/27/2017 FINDINGS:  Endotracheal tube tip measures 3.8 cm above the carina. Enteric tube tip is off the field of view but below the left hemidiaphragm. Right central venous catheter tip over the low SVC region. No pneumothorax. Shallow inspiration with atelectasis in the lung bases. Cardiac enlargement. No pulmonary vascular congestion. Left perihilar infiltration may indicate focal pneumonia. Asymmetrical edema could also have this appearance. Suggestion of small bilateral pleural effusions. No pneumothorax. IMPRESSION: Appliances appear in satisfactory position. Cardiac enlargement. Left perihilar infiltration could represent pneumonia or asymmetrical edema. Probable small bilateral pleural effusions. Shallow inspiration with atelectasis in both lung bases. Electronically Signed   By: Lucienne Capers M.D.   On: 08/28/2017 06:32   Dg Chest Port 1 View  Result Date: 08/27/2017 CLINICAL DATA:  Intubated. EXAM: PORTABLE CHEST 1 VIEW COMPARISON:  08/26/2017 FINDINGS: Endotracheal tube terminates 2.5 cm above the carina. Right jugular catheter terminates over the mid SV Enteric tube courses into the stomach with tip not imaged. The cardiac silhouette remains enlarged. Diffuse airspace opacities throughout the left mid and left lower lung and milder opacities on the right have not significantly changed. No large pleural effusion or pneumothorax is identified. IMPRESSION: Left greater than right lung airspace opacities without significant interval change and which may reflect pneumonia or edema. Electronically Signed   By: Logan Bores M.D.   On: 08/27/2017 07:48   Dg Chest Port 1 View  Result Date: 08/26/2017 CLINICAL DATA:  Followup ventilator support. EXAM: PORTABLE CHEST 1 VIEW COMPARISON:  08/25/2017 FINDINGS: Endotracheal tube tip is 2 cm above the carina. Nasogastric tube enters the stomach. Right internal jugular central line tip in the SVC above the right atrium. Diffuse airspace filling pattern continues to worsen consistent  with diffuse pneumonia and/or edema. This is most pronounced in the lower lobes. No visible effusion. IMPRESSION: Lines and tubes well positioned. Continued worsening of diffuse alveolar filling pattern consistent with pneumonia and/or edema. Findings more severe in the lower lobes. Electronically Signed   By: Nelson Chimes M.D.   On: 08/26/2017 07:43   Dg Chest Port 1 View  Result Date: 08/25/2017 CLINICAL DATA:  52 year old female with ARDS from influenza type A. EXAM: PORTABLE CHEST 1 VIEW COMPARISON:  08/24/2017 and earlier. FINDINGS: Portable AP semi upright view at 0417 hrs. Stable endotracheal tube and enteric tube. Stable right IJ central line. The patient remains mildly rotated to the left. Stable lung volumes. Stable cardiac size and mediastinal contours. Continued dense retrocardiac opacity with perihilar air bronchograms, greater on the left. Patchy and indistinct bilateral perihilar and upper lung opacity. Overall ventilation is stable since 08/22/2017. No superimposed pneumothorax.  Probable bilateral pleural effusions. Paucity bowel gas in the upper abdomen. IMPRESSION: 1.  Stable lines and tubes. 2. Continued stable ventilation. Bilateral basilar predominant consolidation. Likely left and possible right pleural effusions. Electronically Signed   By: Herminio Heads.D.  On: 08/25/2017 08:19   Dg Chest Port 1 View  Result Date: 08/24/2017 CLINICAL DATA:  Shortness of breath. EXAM: PORTABLE CHEST 1 VIEW COMPARISON:  Radiograph August 23, 2016. FINDINGS: Stable cardiomegaly. Endotracheal nasogastric tubes are unchanged in position. Right internal jugular catheter is unchanged in position. Stable bilateral perihilar and basilar opacities are noted most consistent with pneumonia or edema. No pneumothorax is noted. Probable left pleural effusion is again noted. Bony thorax is unremarkable. IMPRESSION: Stable support apparatus. Stable bilateral lung opacities as described above. Electronically Signed    By: Marijo Conception, M.D.   On: 08/24/2017 07:19   Dg Chest Port 1 View  Result Date: 08/23/2017 CLINICAL DATA:  52 year old female with ARDS from influenza type A. EXAM: PORTABLE CHEST 1 VIEW COMPARISON:  08/22/2017 and earlier. FINDINGS: Portable AP semi upright view at 0448 hours. Stable endotracheal tube tip between the level the clavicles and carina. Stable enteric tube looped in the stomach. Stable right IJ central line. Stable cardiac size and mediastinal contours. Stable lung volumes. Patchy and confluent bilateral pulmonary opacity, worst at the lung bases. Dense retrocardiac opacity obscuring much of the diaphragm as before. No pneumothorax. No large pleural effusion. Paucity of bowel gas in the upper abdomen. IMPRESSION: 1.  Stable lines and tubes. 2. Stable ventilation. Bilateral ARDS/pneumonia with basilar predominant pulmonary consolidation. Electronically Signed   By: Genevie Ann M.D.   On: 08/23/2017 07:04   Dg Chest Port 1 View  Result Date: 08/22/2017 CLINICAL DATA:  Intubated. EXAM: PORTABLE CHEST 1 VIEW COMPARISON:  08/21/2017 FINDINGS: Endotracheal tube terminates approximately 2.5 cm above the carina. Right jugular catheter terminates over the SVC. Enteric tube courses into the stomach with tip not imaged. The cardiac silhouette remains enlarged. Diffuse bilateral airspace opacities have mildly improved, most notably in the right mid and upper lung. No large pleural effusion or pneumothorax is identified. IMPRESSION: Mild improvement of diffuse bilateral airspace disease. Electronically Signed   By: Logan Bores M.D.   On: 08/22/2017 06:39   Dg Chest Port 1 View  Result Date: 08/21/2017 CLINICAL DATA:  Intubation EXAM: PORTABLE CHEST 1 VIEW COMPARISON:  Portable exam 1047 hours compared to 0531 hours FINDINGS: Tip of endotracheal tube projects 2.9 cm above carina. Nasogastric tube coiled in stomach. RIGHT jugular central venous catheter tip projects over proximal SVC. Enlargement of  cardiac silhouette. Severe diffuse BILATERAL airspace infiltrates unchanged. No pneumothorax. IMPRESSION: Tip of endotracheal tube projects 2.9 cm above carina. Enlargement of cardiac silhouette with persistent severe diffuse BILATERAL airspace infiltrates. Electronically Signed   By: Lavonia Dana M.D.   On: 08/21/2017 11:16   Dg Chest Port 1 View  Result Date: 08/21/2017 CLINICAL DATA:  Acute respiratory failure EXAM: PORTABLE CHEST 1 VIEW COMPARISON:  1/26/9 FINDINGS: Endotracheal tube, nasogastric catheter and right jugular central line are again seen and stable. Cardiac shadow is enlarged but stable. Diffuse bilateral infiltrates are again identified unchanged from the prior exam. No pneumothorax is seen. IMPRESSION: No change from the previous day. Electronically Signed   By: Inez Catalina M.D.   On: 08/21/2017 07:31   Dg Chest Port 1 View  Result Date: 08/20/2017 CLINICAL DATA:  Patient on ventilator. EXAM: PORTABLE CHEST 1 VIEW COMPARISON:  August 19, 2017 FINDINGS: The ETT is in good position. The NG tube terminates below today's film. The right IJ is stable. No pneumothorax. Bilateral pulmonary infiltrates remain with slight interval improvement. No other changes. IMPRESSION: 1. Stable support apparatus. 2. Bilateral pulmonary infiltrates remain with  minimal interval improvement. Electronically Signed   By: Dorise Bullion III M.D   On: 08/20/2017 07:27   Dg Chest Port 1 View  Result Date: 08/19/2017 CLINICAL DATA:  Central line placement. EXAM: PORTABLE CHEST 1 VIEW COMPARISON:  08/19/2017 FINDINGS: 1947 hours endotracheal tube tip is 2.9 cm above the base of the carina. The NG tube passes into the stomach although the distal tip position is not included on the film. Right IJ central line is new in the interval with the tip overlying the mid SVC level. No right pneumothorax. Cardiopericardial silhouette appears upper normal to enlarged. There is apparent interval progression of bilateral patchy  airspace disease, potentially accentuated by the lower lung volumes on current study. IMPRESSION: Right IJ central line tip overlies expected location of the mid SVC. No pneumothorax. More pronounced bilateral diffuse airspace opacity potentially accentuated by the lower volume film. Electronically Signed   By: Misty Stanley M.D.   On: 08/19/2017 20:19   Dg Chest Portable 1 View  Result Date: 08/19/2017 CLINICAL DATA:  Status post intubation today.  Multiorgan failure. EXAM: PORTABLE CHEST 1 VIEW COMPARISON:  Single-view of the chest earlier today. FINDINGS: New endotracheal tube is in place with the tip in good position just below the clavicular heads. New NG tube is looped in the stomach with the tip projecting below the inferior margin of the film. Diffuse bilateral airspace disease is worse on the left and unchanged. Heart size is upper normal. No pneumothorax or pleural effusion. IMPRESSION: ETT and NG tube projecting good position. No change in extensive bilateral airspace disease. Electronically Signed   By: Inge Rise M.D.   On: 08/19/2017 17:50   Dg Chest Port 1 View  Result Date: 08/19/2017 CLINICAL DATA:  Acute respiratory failure EXAM: PORTABLE CHEST 1 VIEW COMPARISON:  08/18/2017 and 08/17/2017 radiographs FINDINGS: Cardiomegaly again noted. Stable to slightly increased diffuse bilateral airspace disease noted. There is no evidence of pneumothorax. No other significant changes are identified. IMPRESSION: Stable to slightly increased diffuse bilateral airspace opacities. Electronically Signed   By: Margarette Canada M.D.   On: 08/19/2017 08:47   Dg Chest Port 1 View  Result Date: 08/18/2017 CLINICAL DATA:  Respiratory failure. EXAM: PORTABLE CHEST 1 VIEW COMPARISON:  08/17/2017. FINDINGS: Cardiomegaly again noted. Diffuse bilateral pulmonary infiltrates are again noted. These infiltrates could represent bilateral pneumonia and/or pulmonary edema. No interim improvement. Small left pleural  effusion cannot be excluded. No pneumothorax. IMPRESSION: Cardiomegaly again noted. Diffuse bilateral pulmonary infiltrates are again noted. These could represent changes of bilateral pneumonia and/or pulmonary edema. No interim change. Small left pleural effusion cannot be excluded. Electronically Signed   By: Marcello Moores  Register   On: 08/18/2017 14:16   Dg Abd Portable 1v  Result Date: 09/01/2017 CLINICAL DATA:  Check gastric catheter placement EXAM: PORTABLE ABDOMEN - 1 VIEW COMPARISON:  08/19/2017 FINDINGS: Scattered large and small bowel gas is noted. Gastric catheter is noted within the stomach. Bibasilar infiltrates are noted left greater than right. IMPRESSION: Gastric catheter within the stomach. Electronically Signed   By: Inez Catalina M.D.   On: 09/01/2017 11:04   Dg Abd Portable 1v  Result Date: 08/19/2017 CLINICAL DATA:  Status post NG tube placement today. EXAM: PORTABLE ABDOMEN - 1 VIEW COMPARISON:  None. FINDINGS: NG tube is looped in the stomach with the tip projecting in the mid to distal body. Bowel gas pattern is nonobstructive. Mild gaseous prominence of small bowel loops may be due to ileus. IMPRESSION: NG tube  in good position. Mild gaseous distention of small and large bowel suggestive of ileus. Electronically Signed   By: Inge Rise M.D.   On: 08/19/2017 17:52   Dg Swallowing Func-speech Pathology  Result Date: 09/08/2017 Objective Swallowing Evaluation: Type of Study: MBS-Modified Barium Swallow Study  Patient Details Name: Cassie [name] MRN: [id number] Date of Birth: 1966-04-20 Today's Date: 09/08/2017 Time: SLP Start Time (ACUTE ONLY): 2353 -SLP Stop Time (ACUTE ONLY): 1256 SLP Time Calculation (min) (ACUTE ONLY): 18 min Past Medical History: Past Medical History: Diagnosis Date . Hypothyroidism  Past Surgical History: Past Surgical History: Procedure Laterality Date . DENTAL SURGERY   HPI: 52 y/o female with minimal past medical history admitted with ARDS from Influenza A.  Received Tamiflu, currently enrolled in a phase 2 clinical trial for an influenza investigational agent. She was intubated for airway support and respiratory distress (ARDS) on 1/25. Gradual improvement over the following several days. Attempted extubation on 2/7 but patient had significant stridor and respiratory distress so she was re-intubated shortly after. Tracheostomy placed 2/8. MBS 2/10 with recommendation for continued NPO.  Subjective: The patient was seen in radiology to determine current swallowing physiology.  Assessment / Plan / Recommendation CHL IP CLINICAL IMPRESSIONS 09/08/2017 Clinical Impression Pt's swallow function has improved from prior study on 09/04/17 with pt PMV placed. Mildly decreased timing and coordination of swallow resulted in flash penetration with thin and episode of trace aspiration (mild cough) with nectar with larger sip. Verbal cues for smaller sips nectar were effective with adequate pharyngeal strength without residual. Oral manipulation and transit functional with regular texture. Swallow function was also assessed without PMV in the event she will need to eat without valve and able to protect airway. Given decreased endurance and deconditioning will initiate a Dys 3 texture, nectar thick liquids, place PMV with meals/meds, pills whole in applesauce, no straws and continued ST. SLP Visit Diagnosis Dysphagia, pharyngeal phase (R13.13) Attention and concentration deficit following -- Frontal lobe and executive function deficit following -- Impact on safety and function Mild aspiration risk;Moderate aspiration risk   CHL IP TREATMENT RECOMMENDATION 09/08/2017 Treatment Recommendations Therapy as outlined in treatment plan below   Prognosis 09/08/2017 Prognosis for Safe Diet Advancement Good Barriers to Reach Goals -- Barriers/Prognosis Comment -- CHL IP DIET RECOMMENDATION 09/08/2017 SLP Diet Recommendations Dysphagia 3 (Mech soft) solids;Nectar thick liquid Liquid Administration  via Cup;No straw Medication Administration Whole meds with puree Compensations Slow rate;Small sips/bites Postural Changes Seated upright at 90 degrees   CHL IP OTHER RECOMMENDATIONS 09/08/2017 Recommended Consults -- Oral Care Recommendations Oral care BID Other Recommendations --   CHL IP FOLLOW UP RECOMMENDATIONS 09/08/2017 Follow up Recommendations Inpatient Rehab   CHL IP FREQUENCY AND DURATION 09/08/2017 Speech Therapy Frequency (ACUTE ONLY) min 2x/week Treatment Duration 2 weeks      CHL IP ORAL PHASE 09/08/2017 Oral Phase WFL Oral - Pudding Teaspoon -- Oral - Pudding Cup -- Oral - Honey Teaspoon -- Oral - Honey Cup -- Oral - Nectar Teaspoon -- Oral - Nectar Cup -- Oral - Nectar Straw -- Oral - Thin Teaspoon -- Oral - Thin Cup -- Oral - Thin Straw -- Oral - Puree -- Oral - Mech Soft -- Oral - Regular -- Oral - Multi-Consistency -- Oral - Pill -- Oral Phase - Comment --  CHL IP PHARYNGEAL PHASE 09/08/2017 Pharyngeal Phase Impaired Pharyngeal- Pudding Teaspoon -- Pharyngeal -- Pharyngeal- Pudding Cup -- Pharyngeal -- Pharyngeal- Honey Teaspoon -- Pharyngeal -- Pharyngeal- Honey Cup -- Pharyngeal -- Pharyngeal-  Nectar Teaspoon -- Pharyngeal -- Pharyngeal- Nectar Cup Penetration/Aspiration during swallow Pharyngeal Material enters airway, passes BELOW cords and not ejected out despite cough attempt by patient Pharyngeal- Nectar Straw -- Pharyngeal -- Pharyngeal- Thin Teaspoon NT Pharyngeal -- Pharyngeal- Thin Cup Penetration/Aspiration during swallow Pharyngeal Material enters airway, remains ABOVE vocal cords then ejected out Pharyngeal- Thin Straw -- Pharyngeal -- Pharyngeal- Puree WFL Pharyngeal -- Pharyngeal- Mechanical Soft -- Pharyngeal -- Pharyngeal- Regular WFL Pharyngeal -- Pharyngeal- Multi-consistency -- Pharyngeal -- Pharyngeal- Pill -- Pharyngeal -- Pharyngeal Comment --  CHL IP CERVICAL ESOPHAGEAL PHASE 09/08/2017 Cervical Esophageal Phase WFL Pudding Teaspoon -- Pudding Cup -- Honey Teaspoon -- Honey Cup  -- Nectar Teaspoon -- Nectar Cup -- Nectar Straw -- Thin Teaspoon -- Thin Cup -- Thin Straw -- Puree -- Mechanical Soft -- Regular -- Multi-consistency -- Pill -- Cervical Esophageal Comment -- No flowsheet data found. [place] Siren 09/08/2017, 4:06 PM Orbie Pyo Colvin Caroli.Ed CCC-SLP Pager [phone]              Dg Swallowing Func-speech Pathology  Result Date: 09/04/2017 Objective Swallowing Evaluation: Type of Study: MBS-Modified Barium Swallow Study  Patient Details Name: Cassie [name] MRN: [id number] Date of Birth: [birth year] Today's Date: 09/04/2017 Time: SLP Start Time (ACUTE ONLY): 1430 -SLP Stop Time (ACUTE ONLY): 1500 SLP Time Calculation (min) (ACUTE ONLY): 30 min Past Medical History: Past Medical History: Diagnosis Date . Hypothyroidism  Past Surgical History: Past Surgical History: Procedure Laterality Date . DENTAL SURGERY   HPI: 52 y/o female with minimal past medical history admitted with ARDS from Influenza A. Received Tamiflu, currently enrolled in a phase 2 clinical trial for an influenza investigational agent. She was intubated for airway support and respiratory distress (ARDS) on 1/25. Gradual improvement over the following several days. Attempted extubation on 2/7 but patient had significant stridor and respiratory distress so she was re-intubated shortly after. Tracheostomy placed 2/8.  Subjective: The patient was seen in radiology to determine current swallowing physiology.  Assessment / Plan / Recommendation CHL IP CLINICAL IMPRESSIONS 09/04/2017 Clinical Impression MBS was completed using thin liquids and pureed material.  The patient presented with a pharyngeal dysphagia characterized by delayed swallow initiation, decreased hyo-laryngeal excursion, decreased epiglottic movement, decrease laryngeal vestibule closure, decreased base of tongue movement and decreased pharyngeal constriction.  In addition, passive backflow through the cricopharyngeal segment to the level of the  pyriform sinuses was seen given pureed material.  These deficits led to vallecular and pyriform sinus residue.  In addition,  penetration prior to the swallow to the level of the vocal cords with subsequent SILENT aspiration after the swallow was seen given spoon sips of thin liquids.  Penetration into the laryngeal vestibule after the swallow with suspected silent aspiration was seen given pureed material.  Recommend that the patient remain NPO with alternate means for medication except for ice chips fully supervised by nursing staff with aggressive oral care at least 4 times per day.  ST will follow during acute stay for swallowing therapy and readiness for PO's.  The patient will most likely need repeat instrumental exam prior to initiation of an oral diet.   SLP Visit Diagnosis -- Attention and concentration deficit following -- Frontal lobe and executive function deficit following -- Impact on safety and function --   CHL IP TREATMENT RECOMMENDATION 09/04/2017 Treatment Recommendations Therapy as outlined in treatment plan below   Prognosis 09/04/2017 Prognosis for Safe Diet Advancement Good Barriers to Reach Goals -- Barriers/Prognosis Comment -- CHL IP DIET  RECOMMENDATION 09/04/2017 SLP Diet Recommendations NPO;Ice chips PRN after oral care Liquid Administration via -- Medication Administration Via alternative means Compensations -- Postural Changes --   CHL IP OTHER RECOMMENDATIONS 09/04/2017 Recommended Consults -- Oral Care Recommendations Oral care QID Other Recommendations Have oral suction available   CHL IP FOLLOW UP RECOMMENDATIONS 09/04/2017 Follow up Recommendations (No Data)   CHL IP FREQUENCY AND DURATION 09/04/2017 Speech Therapy Frequency (ACUTE ONLY) min 2x/week Treatment Duration 2 weeks      CHL IP ORAL PHASE 09/04/2017 Oral Phase WFL Oral - Pudding Teaspoon -- Oral - Pudding Cup -- Oral - Honey Teaspoon -- Oral - Honey Cup -- Oral - Nectar Teaspoon -- Oral - Nectar Cup -- Oral - Nectar Straw --  Oral - Thin Teaspoon -- Oral - Thin Cup -- Oral - Thin Straw -- Oral - Puree -- Oral - Mech Soft -- Oral - Regular -- Oral - Multi-Consistency -- Oral - Pill -- Oral Phase - Comment --  CHL IP PHARYNGEAL PHASE 09/04/2017 Pharyngeal Phase Impaired Pharyngeal- Pudding Teaspoon -- Pharyngeal -- Pharyngeal- Pudding Cup -- Pharyngeal -- Pharyngeal- Honey Teaspoon -- Pharyngeal -- Pharyngeal- Honey Cup -- Pharyngeal -- Pharyngeal- Nectar Teaspoon -- Pharyngeal -- Pharyngeal- Nectar Cup -- Pharyngeal -- Pharyngeal- Nectar Straw -- Pharyngeal -- Pharyngeal- Thin Teaspoon Delayed swallow initiation-pyriform sinuses;Reduced pharyngeal peristalsis;Reduced epiglottic inversion;Reduced anterior laryngeal mobility;Reduced laryngeal elevation;Reduced airway/laryngeal closure;Reduced tongue base retraction;Penetration/Aspiration before swallow;Penetration/Apiration after swallow Pharyngeal Material enters airway, passes BELOW cords without attempt by patient to eject out (silent aspiration) Pharyngeal- Thin Cup -- Pharyngeal -- Pharyngeal- Thin Straw -- Pharyngeal -- Pharyngeal- Puree Delayed swallow initiation-vallecula;Reduced pharyngeal peristalsis;Reduced epiglottic inversion;Reduced anterior laryngeal mobility;Reduced laryngeal elevation;Reduced airway/laryngeal closure;Reduced tongue base retraction;Penetration/Apiration after swallow Pharyngeal Material enters airway, CONTACTS cords and not ejected out Pharyngeal- Mechanical Soft -- Pharyngeal -- Pharyngeal- Regular -- Pharyngeal -- Pharyngeal- Multi-consistency -- Pharyngeal -- Pharyngeal- Pill -- Pharyngeal -- Pharyngeal Comment --  CHL IP CERVICAL ESOPHAGEAL PHASE 09/04/2017 Cervical Esophageal Phase WFL Pudding Teaspoon -- Pudding Cup -- Honey Teaspoon -- Honey Cup -- Nectar Teaspoon -- Nectar Cup -- Nectar Straw -- Thin Teaspoon -- Thin Cup -- Thin Straw -- Puree -- Mechanical Soft -- Regular -- Multi-consistency -- Pill -- Cervical Esophageal Comment -- Shelly Flatten,  MA, CCC-SLP Acute Rehab SLP [phone] Lamar Sprinkles 09/04/2017, 3:15 PM                   Discharge Exam: Vitals:   09/10/17 0753 09/10/17 0759  BP:  (!) 153/95  Pulse: 94 95  Resp: 17 14  Temp:  98.4 F (36.9 C)  SpO2: 100% 100%   Vitals:   09/10/17 0630 09/10/17 0752 09/10/17 0753 09/10/17 0759  BP:    (!) 153/95  Pulse:   94 95  Resp:   17 14  Temp:    98.4 F (36.9 C)  TempSrc:    Oral  SpO2:  97% 100% 100%  Weight: 94.6 kg (208 lb 9.6 oz)     Height:        General: Pt is alert, awake, not in acute distress, ng tube in place.  Cardiovascular: RRR, S1/S2 +, no rubs, no gallops Respiratory: CTA bilaterally, no wheezing, no rhonchi, trach  Abdominal: Soft, NT, ND, bowel sounds + Extremities: no edema, no cyanosis    The results of significant diagnostics from this hospitalization (including imaging, microbiology, ancillary and laboratory) are listed below for reference.     Microbiology: No results found for this or any  previous visit (from the past 240 hour(s)).   Labs: BNP (last 3 results) Recent Labs    08/18/17 0756  BNP 97.7   Basic Metabolic Panel: Recent Labs  Lab 09/04/17 1011 09/05/17 0552 09/06/17 0504 09/07/17 0426 09/08/17 0500 09/09/17 1153  NA 150* 152* 154* 150* 142 141  K 3.5 3.4* 3.0* 3.2* 2.9* 3.0*  CL 108 112* 112* 113* 104 106  CO2 _0 GLUCOSE 142* 181* 181* 143* 161* 138*  BUN 32* 32* 36* 32* 23* 17  CREATININE 0.67 0.83 0.78 0.67 0.57 0.58  CALCIUM 10.1 10.2 9.8 9.5 9.1 9.3  MG 2.0 2.1  --   --   --   --   PHOS 3.5 3.1  --   --   --   --    Liver Function Tests: No results for input(s): AST, ALT, ALKPHOS, BILITOT, PROT, ALBUMIN in the last 168 hours. No results for input(s): LIPASE, AMYLASE in the last 168 hours. No results for input(s): AMMONIA in the last 168 hours. CBC: Recent Labs  Lab 09/04/17 1011 09/05/17 0552 09/06/17 0504  WBC 9.2 8.3 9.8  HGB 10.1* 10.0* 10.8*  HCT 34.4* 34.4* 36.6   MCV 81.7 80.8 81.2  PLT 445* 374 410*   Cardiac Enzymes: No results for input(s): CKTOTAL, CKMB, CKMBINDEX, TROPONINI in the last 168 hours. BNP: Invalid input(s): POCBNP CBG: Recent Labs  Lab 09/09/17 1653 09/09/17 2059 09/10/17 0011 09/10/17 0332 09/10/17 0818  GLUCAP 112* 127* 123* 139* 205*   D-Dimer No results for input(s): DDIMER in the last 72 hours. Hgb A1c No results for input(s): HGBA1C in the last 72 hours. Lipid Profile No results for input(s): CHOL, HDL, LDLCALC, TRIG, CHOLHDL, LDLDIRECT in the last 72 hours. Thyroid function studies Recent Labs    09/10/17 0514  TSH 1.191   Anemia work up No results for input(s): VITAMINB12, FOLATE, FERRITIN, TIBC, IRON, RETICCTPCT in the last 72 hours. Urinalysis    Component Value Date/Time   COLORURINE YELLOW 08/20/2017 1019   APPEARANCEUR TURBID (A) 08/20/2017 1019   LABSPEC 1.029 08/20/2017 1019   PHURINE 5.0 08/20/2017 1019   GLUCOSEU 50 (A) 08/20/2017 1019   HGBUR SMALL (A) 08/20/2017 1019   BILIRUBINUR NEGATIVE 08/20/2017 1019   KETONESUR 5 (A) 08/20/2017 1019   PROTEINUR 100 (A) 08/20/2017 1019   NITRITE NEGATIVE 08/20/2017 1019   LEUKOCYTESUR NEGATIVE 08/20/2017 1019   Sepsis Labs Invalid input(s): PROCALCITONIN,  WBC,  LACTICIDVEN Microbiology No results found for this or any previous visit (from the past 240 hour(s)).   Time coordinating discharge: Over 30 minutes  SIGNED:   Elmarie Shiley, MD  [facility] 09/10/2017, 8:56 AM Pager [phone]  If 7PM-7AM, please contact night-coverage [email or web address] Password [email or web address]

## 2017-09-10 NOTE — Progress Notes (Signed)
Pt received discharge instructions and understood it. Pt has PICC on Rt. Arm & trach with passy muir valve. Gave the report to IPR and taking pt to IPR. HS [employer]

## 2017-09-11 ENCOUNTER — Inpatient Hospital Stay (HOSPITAL_COMMUNITY): Payer: Self-pay | Admitting: Occupational Therapy

## 2017-09-11 ENCOUNTER — Inpatient Hospital Stay (HOSPITAL_COMMUNITY): Payer: Self-pay

## 2017-09-11 DIAGNOSIS — R5381 Other malaise: Principal | ICD-10-CM

## 2017-09-11 DIAGNOSIS — Z930 Tracheostomy status: Secondary | ICD-10-CM

## 2017-09-11 DIAGNOSIS — L909 Atrophic disorder of skin, unspecified: Secondary | ICD-10-CM

## 2017-09-11 DIAGNOSIS — D62 Acute posthemorrhagic anemia: Secondary | ICD-10-CM

## 2017-09-11 DIAGNOSIS — I10 Essential (primary) hypertension: Secondary | ICD-10-CM

## 2017-09-11 DIAGNOSIS — R609 Edema, unspecified: Secondary | ICD-10-CM

## 2017-09-11 MED ORDER — METOPROLOL TARTRATE 12.5 MG HALF TABLET
12.5000 mg | ORAL_TABLET | Freq: Two times a day (BID) | ORAL | Status: DC
  Administered 2017-09-11 – 2017-09-15 (×9): 12.5 mg via ORAL
  Filled 2017-09-11 (×9): qty 1

## 2017-09-11 NOTE — Procedures (Signed)
During routine trach assessment this am, trach is not in good position. Attempted to advance trach back in but unable to do so.  MD at bedside and would like to remove trach.  Trach removed at this time.  Pt in no distress.  Spo2 96% on RA.  Stoma site cleaned and dressing applied.  RT will monitor

## 2017-09-11 NOTE — Progress Notes (Signed)
[facility]ITATION     PROGRESS NOTE    Subjective/Complaints: Patient had good night.  No problems with sleep.  Up with occupational therapy already this morning and seemed to tolerate well.  Respiratory therapist came into perform trach care and noticed that trach was out of place and was unable to advance trach back into the neck.  ROS: pt denies nausea, vomiting, diarrhea, cough, shortness of breath or chest pain .  Objective: Vital Signs: Blood pressure (!) 170/88, pulse (!) 102, temperature 98.9 F (37.2 C), temperature source Oral, resp. rate 19, weight 95 kg (209 lb 7 oz), last menstrual period 08/17/2017, SpO2 96 %. No results found. Recent Labs    09/10/17 1204 09/10/17 1349  WBC 11.3* 12.3*  HGB 10.7* 11.0*  HCT 35.0* 36.4  PLT 266 307   Recent Labs    09/09/17 1153 09/10/17 0923 09/10/17 1349  NA 141 139  --   K 3.0* 3.0*  --   CL 106 103  --   GLUCOSE 138* 187*  --   BUN 17 12  --   CREATININE 0.58 0.61 0.60  CALCIUM 9.3 9.2  --    CBG (last 3)  Recent Labs    09/10/17 0011 09/10/17 0332 09/10/17 0818  GLUCAP 123* 139* 205*    Wt Readings from Last 3 Encounters:  09/11/17 95 kg (209 lb 7 oz)  09/10/17 94.6 kg (208 lb 9.6 oz)    Physical Exam:  HENT:  Head:Normocephalic.  Eyes:EOMare normal. Right eye exhibits no discharge. Left eye exhibitsno discharge.  Neck: Trach tube protruding from throat.  No air movement through trach. Cardiovascular:RRR without murmur. No JVD  Respiratory: Lungs are clear without wheezes or rhonchi AL:PFXT.Bowel sounds are normal. She exhibitsno distension.  Musculoskeletal: She exhibits noedema,tendernessor deformity.  Psychiatric: She has anormal mood and affect. Herbehavior is normal.Thought contentnormal.  Skin. Warm and dry.  Small voltage slid over the upper gluteal crease with mild fibronecrotic debris.  There also are 3 2 mm patches of breakdown along the left  gluteus.  Area general a bit irritated and macerated. Musculoskeletal: No edema or tenderness in extremities Neurological: She isalert. Motor: 4/5 UE prox to distal. LE: 3/5 HF and KE and 4/5 prox to distal. No sensory findings. Normal cognition, DTR's 1+, sensation normal--motor and sensory exam are stable Psych: pleasant and appropriate     Assessment/Plan: 1.  Functional deficits secondary to debility/respiratory failure which require 3+ hours per day of interdisciplinary therapy in a comprehensive inpatient rehab setting. Physiatrist is providing close team supervision and 24 hour management of active medical problems listed below. Physiatrist and rehab team continue to assess barriers to discharge/monitor patient progress toward functional and medical goals.  Function:  Bathing Bathing position   Position: Shower  Bathing parts Body parts bathed by patient: Right arm, Right lower leg, Left arm, Left lower leg, Chest, Abdomen, Front perineal area, Right upper leg, Left upper leg Body parts bathed by helper: Buttocks, Back  Bathing assist Assist Level: Touching or steadying assistance(Pt > 75%)      Upper Body Dressing/Undressing Upper body dressing   What is the patient wearing?: Hospital gown                Upper body assist Assist Level: Touching or steadying assistance(Pt > 75%)      Lower Body Dressing/Undressing Lower body dressing   What is the patient wearing?: Hospital Gown, Non-skid slipper socks  Non-skid slipper socks- Performed by patient: Don/doff right sock, Don/doff left sock                    Lower body assist Assist for lower body dressing: Touching or steadying assistance (Pt > 75%)      Toileting Toileting   Toileting steps completed by patient: Adjust clothing prior to toileting, Performs perineal hygiene, Adjust clothing after toileting   Toileting Assistive Devices: Grab bar or rail  Toileting assist Assist level:  Touching or steadying assistance (Pt.75%)   Transfers Chair/bed transfer   Chair/bed transfer method: Stand pivot Chair/bed transfer assist level: Touching or steadying assistance (Pt > 75%) Chair/bed transfer assistive device: Armrests     Locomotion Ambulation     Max distance: 168ft Assist level: Touching or steadying assistance (Pt > 75%)   Wheelchair Wheelchair activity did not occur: N/A        Cognition Comprehension Comprehension assist level: Follows complex conversation/direction with extra time/assistive device  Expression Expression assist level: Expresses complex 90% of the time/cues < 10% of the time  Social Interaction Social Interaction assist level: Interacts appropriately with others with medication or extra time (anti-anxiety, antidepressant).  Problem Solving Problem solving assist level: Solves basic problems with no assist  Memory Memory assist level: Recognizes or recalls 90% of the time/requires cueing < 10% of the time   Medical Problem List and Plan: 1.Debilitysecondary to ARDS/VDRF relatedto influenza A -Beginning therapies today 2. DVT Prophylaxis/Anticoagulation: Subcutaneous Lovenox.  Doppler studies negative for DVT 3. Pain Management:Tylenol as needed 4. Mood:Provide emotional support 5. Neuropsych: This patientiscapable of making decisions on herown behalf. 6. Skin/Wound Care:Routine skin checks   -Apply foam dressing to gluteal wounds.  These are very superficial.  A gentle scrub with moistened gauze daily to remove debris would be helpful.  Otherwise keep area dry 7. Fluids/Electrolytes/Nutrition:Routine I&O's with follow-up chemistriesupon admit -remove PICC 8.Tracheostomy 09/02/2017.  Given that trach was already dislodged for an unknown period of time and patient tolerated without any issues, instead of trying to reinsert or force this back in we decided to remove trach and applied dressing.     -Observe  closely for tolerance but so far doing well.   9.Dysphagia.Dysphagia #3 nectar thickliquids. Follow-up speech therapy.  10.Hypertension. Norvasc 10 mg daily   -Needs better control.  Begin low-dose Lopressor 12.5 mg twice daily 11.Anemia due to critical illness. Follow-up CBC    LOS (Days) 1 A FACE TO FACE EVALUATION WAS PERFORMED  Meredith Staggers, MD 09/11/2017 10:21 AM

## 2017-09-11 NOTE — Plan of Care (Signed)
  Progressing Consults Skin Care Protocol Initiated - if Braden Score 18 or less Description If consults are not indicated, leave blank or document N/A 09/11/2017 0055 - Progressing by Cornell Barman, RN RH SKIN INTEGRITY RH STG SKIN FREE OF INFECTION/BREAKDOWN Description Patients skin will remain free from further breakdown or infection with mod assist.  09/11/2017 0055 - Progressing by Cornell Barman, RN RH STG MAINTAIN SKIN INTEGRITY WITH ASSISTANCE Description STG Maintain Skin Integrity With mod Assistance.  09/11/2017 0055 - Progressing by Cornell Barman, RN RH STG ABLE TO PERFORM INCISION/WOUND CARE W/ASSISTANCE Description STG Able To Perform Incision/Wound Care With mod Assistance.  09/11/2017 0055 - Progressing by Cornell Barman, RN RH SAFETY RH STG ADHERE TO SAFETY PRECAUTIONS W/ASSISTANCE/DEVICE Description STG Adhere to Safety Precautions With Mod I Assistance/Device.  09/11/2017 0055 - Progressing by Cornell Barman, RN RH PAIN MANAGEMENT RH STG PAIN MANAGED AT OR BELOW PT'S PAIN GOAL Description < 3  09/11/2017 0055 - Progressing by Cornell Barman, RN

## 2017-09-11 NOTE — Progress Notes (Signed)
Occupational Therapy Assessment and Plan  Patient Details  Name: Cassie [name] MRN: [id number] Date of Birth: 13-Feb-1966  OT Diagnosis: muscle weakness (generalized) Rehab Potential: Rehab Potential (ACUTE ONLY): Good ELOS: 7-10 days   Today's Date: 09/11/2017 OT Individual Time: 0700-0800 OT Individual Time Calculation (min): 60 min     Problem List:  Patient Active Problem List   Diagnosis Date Noted  . Acute respiratory distress syndrome (ARDS) ([place]) 09/10/2017  . Debility 09/10/2017  . Tachycardia   . Tracheostomy in place [place])   . Oropharyngeal dysphagia   . Morbid obesity ([place])   . Tachypnea   . Reactive hypertension   . Prediabetes   . Hypokalemia   . Acute blood loss anemia   . Hyperglycemia 09/06/2017  . Sinus tachycardia 09/06/2017  . Hypernatremia 09/06/2017  . Acute respiratory failure with hypoxemia ([place])   . Tracheostomy status ([place])   . Transaminitis 08/19/2017  . Electrolyte imbalance 08/19/2017  . Influenza B   . ARDS (adult respiratory distress syndrome) ([place])   . Influenza A 08/18/2017  . Acute lung injury 08/18/2017  . Multifocal pneumonia 08/17/2017  . Acute respiratory failure ([place]) 08/17/2017  . Sepsis ([place]) 08/17/2017  . Hypothyroidism     Past Medical History:  Past Medical History:  Diagnosis Date  . Hypothyroidism    Past Surgical History:  Past Surgical History:  Procedure Laterality Date  . DENTAL SURGERY      Assessment & Plan Clinical Impression:Cassie Gardneris a 52 y.o.right handed femalewith history of hypothyroid, obesity, recent travel.Per chart review patient lives with spouse. Independent prior to admission. Admitted on 08/17/17 with acute hypoxic respiratory failure due to severe influenza A superimposed on pneumonia. History taken from chart review.She was treated with IVIG X 2 v/s placebo for influenza A phase two study. She continued to have fevers and was treated with antibiotics, Tamiflu as well as IV  lasix but developed ARDS with septic shock requiring intubation. She had difficulty with extubation attempts due to stridor requiring tracheostomy on 2/8. She was extubated to ATC and downsized to#4[place]uffless02/15/2019 with and then to capon Monday, 09/12/2017 and consider decannulation 09/13/2017.Diet has been advanced to dysphagia #3 nectar liquids.Subcutaneous Lovenox for DVT prophylaxis.PTand OTevaluationscompletedand CIR recommended due to debility.Patient was admitted for a comprehensive rehabilitation program. Patient transferred to CIR on 09/10/2017 .    Patient currently requires min with basic self-care skills secondary to muscle weakness, decreased cardiorespiratoy endurance and decreased standing balance and decreased balance strategies.  Prior to hospitalization, patient could complete ADLs/IADls with independent .  Patient will benefit from skilled intervention to decrease level of assist with basic self-care skills, increase independence with basic self-care skills and increase level of independence with iADL prior to discharge home with care partner.  Anticipate patient will require intermittent supervision and no further OT follow recommended.  OT - End of Session Activity Tolerance: Tolerates 10 - 20 min activity with multiple rests Endurance Deficit: Yes Endurance Deficit Description: Requires rest breaks throughout bathing/dressing tasks OT Assessment Rehab Potential (ACUTE ONLY): Good OT Patient demonstrates impairments in the following area(s): Balance;Safety;Endurance OT Basic ADL's Functional Problem(s): Grooming;Bathing;Dressing;Toileting OT Transfers Functional Problem(s): Toilet;Tub/Shower OT Additional Impairment(s): None OT Plan OT Intensity: Minimum of 1-2 x/day, 45 to 90 minutes OT Frequency: 5 out of 7 days OT Duration/Estimated Length of Stay: 7-10 days OT Treatment/Interventions: Self Care/advanced ADL retraining;Therapeutic Activities;UE/LE  Coordination activities;Therapeutic Exercise;Patient/family education;Functional mobility training;Community reintegration;DME/adaptive equipment instruction;UE/LE Strength taining/ROM OT Self Feeding Anticipated Outcome(s): Mod I OT Basic Self-Care Anticipated  Outcome(s): Mod I OT Toileting Anticipated Outcome(s): Mod I OT Bathroom Transfers Anticipated Outcome(s): Mod I OT Recommendation Patient destination: Home Follow Up Recommendations: None Equipment Recommended: To be determined   Skilled Therapeutic Intervention Pt seen for OT eval and ADL bathing/dressing session. Pt in supine upon arrival, agreeable to tx session. She transferd to EOB mod I using hospital bed functions. She ambulated throughout room with HHA into bathroom and completed functional transfers throughout session with min A. She bathed seated on tub bench, crossing ankle over knee in figure four position in order to wash B feet. She required assist to wash buttock as she stood at grab bars for support, unable to reach around to reach buttock thoroughly. She exited bathroom with HHA, x2 LOB episodes during return to EOB, once requiring mod A to regain balance.  She returned to recliner at end of session, left seated set-up with breakfast tray and all needs in reach. Education provided throughout session regarding role of OT, POC,benefits of OOB, 3:1 Hospital stay to recovery ratio and d/c planning.   OT Evaluation Precautions/Restrictions  Precautions Precautions: Fall Restrictions Weight Bearing Restrictions: No General Chart Reviewed: Yes Pain   No/denies pain Home Living/Prior Functioning Home Living Family/patient expects to be discharged to:: Private residence Living Arrangements: Spouse/significant other Available Help at Discharge: Family, Available 24 hours/day(Cassie [name] avaialble as needed) Type of Home: Apartment Home Access: Stairs to enter [employer] of Steps: 13 Entrance Stairs-Rails:  Right Home Layout: One level Bathroom Shower/Tub: Research officer, trade union Accessibility: Yes Additional Comments: Has home in [place] and also 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 They split their time in between  Lives With: Significant other Prior Function Level of Independence: Independent with basic ADLs, Independent with homemaking with ambulation, Independent with homemaking with wheelchair  Able to Take Stairs?: Yes Vocation: Full time employment Vocation Requirements: Freight forwarder for [employer] Vision/History: Wears glasses Wears Glasses: Reading only Patient Visual Report: No change from baseline Vision Assessment?: No apparent visual deficits Perception  Perception: Within Functional Limits Praxis Praxis: Intact Cognition Overall Cognitive Status: Within Functional Limits for tasks assessed Arousal/Alertness: Awake/alert Orientation Level: Person;Place;Situation Person: Oriented Place: Oriented Situation: Oriented Year: 2019 Month: February Day of Week: Correct Memory: Appears intact Immediate Memory Recall: Sock;Blue;Bed Memory Recall: Blue;Bed;Sock Memory Recall Sock: Without Cue Memory Recall Blue: Without Cue Memory Recall Bed: Without Cue Awareness: Appears intact Problem Solving: Appears intact Safety/Judgment: Appears intact Sensation Sensation Light Touch: Appears Intact Proprioception: Appears Intact Coordination Gross Motor Movements are Fluid and Coordinated: Yes Fine Motor Movements are Fluid and Coordinated: Yes Finger Nose Finger Test: [facility] Motor  Motor Motor: Within Functional Limits Motor - Skilled Clinical Observations: generalized weakness  Trunk/Postural Assessment  Cervical Assessment Cervical Assessment: Within Functional Limits Thoracic Assessment Thoracic Assessment: Within Functional Limits Lumbar Assessment Lumbar Assessment: Within Functional Limits Postural Control Postural Control: Within Functional  Limits  Balance Balance Balance Assessed: Yes Static Sitting Balance Static Sitting - Level of Assistance: 6: Modified independent (Device/Increase time) Dynamic Sitting Balance Dynamic Sitting - Level of Assistance: 5: Stand by assistance Sitting balance - Comments: Sitting to complete bathing/dressing tasks Static Standing Balance Static Standing - Balance Support: During functional activity Static Standing - Level of Assistance: 5: Stand by assistance;4: Min assist Dynamic Standing Balance Dynamic Standing - Balance Support: During functional activity;No upper extremity supported Dynamic Standing - Level of Assistance: 4: Min assist Dynamic Standing - Comments: Standing during LB bathing/dressing and toileting tasks Extremity/Trunk Assessment RUE Assessment RUE Assessment: Within Functional  Limits(Generalized weakness) LUE Assessment LUE Assessment: Within Functional Limits(Generalized weakness)   See Function Navigator for Current Functional Status.   Refer to Care Plan for Long Term Goals  Recommendations for other services: None    Discharge Criteria: Patient will be discharged from OT if patient refuses treatment 3 consecutive times without medical reason, if treatment goals not met, if there is a change in medical status, if patient makes no progress towards goals or if patient is discharged from hospital.  The above assessment, treatment plan, treatment alternatives and goals were discussed and mutually agreed upon: by patient  Rounds, Amy L 09/11/2017, 8:19 AM

## 2017-09-11 NOTE — Plan of Care (Signed)
  Progressing Consults RH GENERAL PATIENT EDUCATION Description See Patient Education module for education specifics. 09/11/2017 1233 - Progressing by Brita Romp, RN Skin Care Protocol Initiated - if Braden Score 18 or less Description If consults are not indicated, leave blank or document N/A 09/11/2017 1233 - Progressing by Brita Romp, RN RH SKIN INTEGRITY RH STG SKIN FREE OF INFECTION/BREAKDOWN Description Patients skin will remain free from further breakdown or infection with mod assist.  09/11/2017 1233 - Progressing by Brita Romp, RN RH STG MAINTAIN SKIN INTEGRITY WITH ASSISTANCE Description STG Maintain Skin Integrity With mod Assistance.  09/11/2017 1233 - Progressing by Brita Romp, RN RH STG ABLE TO PERFORM INCISION/WOUND CARE W/ASSISTANCE Description STG Able To Perform Incision/Wound Care With mod Assistance.  09/11/2017 1233 - Progressing by Brita Romp, RN RH SAFETY RH STG ADHERE TO SAFETY PRECAUTIONS W/ASSISTANCE/DEVICE Description STG Adhere to Safety Precautions With Mod I Assistance/Device.  09/11/2017 1233 - Progressing by Brita Romp, RN RH PAIN MANAGEMENT RH STG PAIN MANAGED AT OR BELOW PT'S PAIN GOAL Description < 3  09/11/2017 1233 - Progressing by Brita Romp, RN

## 2017-09-11 NOTE — Progress Notes (Signed)
Trach care completed this AM. Humidified air via TC at HS. Complains of feeling dry. Drank 4-5 ginger ale's this shift. Spoke with patient R/T limiting drinks with sugar. Offered H2O, unsweetened tea or diet drinks, agreed to try these. Restless night, "the better I feel, the less I sleep", declined meds at this time for insomnia. Foam dressing to sacrum. Reports sacral pain, but declined med. Patrici Ranks A

## 2017-09-11 NOTE — Progress Notes (Signed)
LE venous duplex prelim: negative for DVT. Lalo Tromp Eunice, RDMS, RVT  

## 2017-09-12 ENCOUNTER — Inpatient Hospital Stay (HOSPITAL_COMMUNITY): Payer: Self-pay | Admitting: Speech Pathology

## 2017-09-12 ENCOUNTER — Inpatient Hospital Stay (HOSPITAL_COMMUNITY): Payer: Self-pay | Admitting: Physical Therapy

## 2017-09-12 ENCOUNTER — Inpatient Hospital Stay (HOSPITAL_COMMUNITY): Payer: Self-pay | Admitting: Occupational Therapy

## 2017-09-12 LAB — COMPREHENSIVE METABOLIC PANEL
ALK PHOS: 84 U/L (ref 38–126)
ALT: 97 U/L — ABNORMAL HIGH (ref 14–54)
ANION GAP: 12 (ref 5–15)
AST: 52 U/L — ABNORMAL HIGH (ref 15–41)
Albumin: 2.9 g/dL — ABNORMAL LOW (ref 3.5–5.0)
BUN: 7 mg/dL (ref 6–20)
CO2: 25 mmol/L (ref 22–32)
CREATININE: 0.57 mg/dL (ref 0.44–1.00)
Calcium: 9.1 mg/dL (ref 8.9–10.3)
Chloride: 102 mmol/L (ref 101–111)
Glucose, Bld: 131 mg/dL — ABNORMAL HIGH (ref 65–99)
Potassium: 3.1 mmol/L — ABNORMAL LOW (ref 3.5–5.1)
SODIUM: 139 mmol/L (ref 135–145)
TOTAL PROTEIN: 6.4 g/dL — AB (ref 6.5–8.1)
Total Bilirubin: 0.5 mg/dL (ref 0.3–1.2)

## 2017-09-12 LAB — CBC WITH DIFFERENTIAL/PLATELET
Basophils Absolute: 0 10*3/uL (ref 0.0–0.1)
Basophils Relative: 0 %
EOS ABS: 0.5 10*3/uL (ref 0.0–0.7)
EOS PCT: 6 %
HCT: 34.8 % — ABNORMAL LOW (ref 36.0–46.0)
HEMOGLOBIN: 10.8 g/dL — AB (ref 12.0–15.0)
LYMPHS ABS: 2.3 10*3/uL (ref 0.7–4.0)
LYMPHS PCT: 28 %
MCH: 24.5 pg — AB (ref 26.0–34.0)
MCHC: 31 g/dL (ref 30.0–36.0)
MCV: 79.1 fL (ref 78.0–100.0)
MONOS PCT: 7 %
Monocytes Absolute: 0.6 10*3/uL (ref 0.1–1.0)
NEUTROS PCT: 59 %
Neutro Abs: 4.9 10*3/uL (ref 1.7–7.7)
Platelets: 252 10*3/uL (ref 150–400)
RBC: 4.4 MIL/uL (ref 3.87–5.11)
RDW: 19.7 % — ABNORMAL HIGH (ref 11.5–15.5)
WBC: 8.4 10*3/uL (ref 4.0–10.5)

## 2017-09-12 LAB — T3, FREE: T3 FREE: 1.8 pg/mL — AB (ref 2.0–4.4)

## 2017-09-12 MED ORDER — POTASSIUM CHLORIDE CRYS ER 20 MEQ PO TBCR
20.0000 meq | EXTENDED_RELEASE_TABLET | Freq: Two times a day (BID) | ORAL | Status: DC
  Administered 2017-09-12 – 2017-09-15 (×7): 20 meq via ORAL
  Filled 2017-09-12 (×7): qty 1

## 2017-09-12 NOTE — Progress Notes (Signed)
[facility] PHYSICAL MEDICINE & REHABILITATION     PROGRESS NOTE    Subjective/Complaints: Patient had good night.  No problems with sleep.  Up with occupational therapy already this morning and seemed to tolerate well.  Respiratory therapist came into perform trach care and noticed that trach was out of place and was unable to advance trach back into the neck.  ROS: pt denies nausea, vomiting, diarrhea, cough, shortness of breath or chest pain .  Objective: Vital Signs: Blood pressure (!) 165/90, pulse 89, temperature 98.9 F (37.2 C), temperature source Oral, resp. rate 20, weight 96 kg (211 lb 10.3 oz), last menstrual period 08/17/2017, SpO2 96 %. Vas [place] Lower Extremity Venous (dvt)  Result Date: 09/11/2017  Lower Venous Study Indication: Edema. Examination Guidelines: A complete evaluation includes B-mode imaging, spectral doppler, color doppler, and power doppler as needed of all accessible portions of each vessel. Bilateral testing is considered an integral part of a complete examination. Limited examinations for reoccurring indications may be performed as noted. The reflux portion of the exam is performed with the patient in reverse Trendelenburg.  Right Venous Findings: +---------+---------------+---------+-----------+----------+-------+          CompressibilityPhasicitySpontaneityPropertiesSummary +---------+---------------+---------+-----------+----------+-------+ CFV      Full           Yes      Yes                          +---------+---------------+---------+-----------+----------+-------+ FV Prox  Full                                                 +---------+---------------+---------+-----------+----------+-------+ FV Mid   Full                                                 +---------+---------------+---------+-----------+----------+-------+ FV DistalFull                                                  +---------+---------------+---------+-----------+----------+-------+ PFV      Full                                                 +---------+---------------+---------+-----------+----------+-------+ POP      Full           Yes      Yes                          +---------+---------------+---------+-----------+----------+-------+ PTV      Full                                                 +---------+---------------+---------+-----------+----------+-------+ PERO     Full                                                 +---------+---------------+---------+-----------+----------+-------+  Left Venous Findings: +---------+---------------+---------+-----------+----------+-------+          CompressibilityPhasicitySpontaneityPropertiesSummary +---------+---------------+---------+-----------+----------+-------+ CFV      Full           Yes      Yes                          +---------+---------------+---------+-----------+----------+-------+ FV Prox  Full                                                 +---------+---------------+---------+-----------+----------+-------+ FV Mid   Full                                                 +---------+---------------+---------+-----------+----------+-------+ FV DistalFull                                                 +---------+---------------+---------+-----------+----------+-------+ PFV      Full                                                 +---------+---------------+---------+-----------+----------+-------+ POP      Full           Yes      Yes                          +---------+---------------+---------+-----------+----------+-------+ PTV      Full                                                 +---------+---------------+---------+-----------+----------+-------+ PERO     Full                                                  +---------+---------------+---------+-----------+----------+-------+    Final Interpretation: Right: There is no evidence of deep vein thrombosis in the lower extremity. No cystic structure found in the popliteal fossa. Left: There is no evidence of deep vein thrombosis in the lower extremity. No cystic structure found in the popliteal fossa.  *See table(s) above for measurements and observations. Electronically signed by Deitra Mayo on 09/11/2017 at 3:31:04 PM.   Recent Labs    09/10/17 1349 09/12/17 0626  WBC 12.3* 8.4  HGB 11.0* 10.8*  HCT 36.4 34.8*  PLT 307 252   Recent Labs    09/10/17 0923 09/10/17 1349 09/12/17 0626  NA 139  --  139  K 3.0*  --  3.1*  CL 103  --  102  GLUCOSE 187*  --  131*  BUN 12  --  7  CREATININE 0.61 0.60 0.57  CALCIUM 9.2  --  9.1   CBG (last 3)  Recent  Labs    09/10/17 0011 09/10/17 0332 09/10/17 0818  GLUCAP 123* 139* 205*    Wt Readings from Last 3 Encounters:  09/12/17 96 kg (211 lb 10.3 oz)  09/10/17 94.6 kg (208 lb 9.6 oz)    Physical Exam:  HENT:  Head:Normocephalic.  Eyes:EOMare normal. Right eye exhibits no discharge. Left eye exhibitsno discharge.  Neck:  Trach stoma remains open with air tracking through.  Dressing not occlusive.  Voice is just above a whisper Cardiovascular:RRR without murmur. No JVD   Respiratory: CTA Bilaterally without wheezes or rales. Normal effort  KY:HCWC.Bowel sounds are normal. She exhibitsno distension.  Musculoskeletal: She exhibits noedema,tendernessor deformity.  Psychiatric: She has anormal mood and affect. Herbehavior is normal.Thought contentnormal.  Skin. Warm and dry.  Small voltage slid over the upper gluteal crease with mild fibronecrotic debris.  There also are 3 2 mm patches of breakdown along the left gluteus.  Area general a bit irritated and macerated.--Gluteal area essentially unchanged.   Musculoskeletal: No edema or tenderness in  extremities Neurological: She isalert. Motor: 4/5 UE prox to distal. LE: 3+/5 HF and KE and 4/5 prox to distal. No sensory findings. Normal cognition, DTR's 1+, sensation normal. Psych: pleasant and appropriate     Assessment/Plan: 1.  Functional deficits secondary to debility/respiratory failure which require 3+ hours per day of interdisciplinary therapy in a comprehensive inpatient rehab setting. Physiatrist is providing close team supervision and 24 hour management of active medical problems listed below. Physiatrist and rehab team continue to assess barriers to discharge/monitor patient progress toward functional and medical goals.  Function:  Bathing Bathing position   Position: Shower  Bathing parts Body parts bathed by patient: Right arm, Right lower leg, Left arm, Left lower leg, Chest, Abdomen, Front perineal area, Right upper leg, Left upper leg Body parts bathed by helper: Buttocks, Back  Bathing assist Assist Level: Touching or steadying assistance(Pt > 75%)      Upper Body Dressing/Undressing Upper body dressing   What is the patient wearing?: Hospital gown                Upper body assist Assist Level: Touching or steadying assistance(Pt > 75%)      Lower Body Dressing/Undressing Lower body dressing   What is the patient wearing?: Hospital Gown, Non-skid slipper socks         Non-skid slipper socks- Performed by patient: Don/doff right sock, Don/doff left sock                    Lower body assist Assist for lower body dressing: Touching or steadying assistance (Pt > 75%)      Toileting Toileting   Toileting steps completed by patient: Adjust clothing prior to toileting, Performs perineal hygiene, Adjust clothing after toileting   Toileting Assistive Devices: Grab bar or rail  Toileting assist Assist level: Supervision or verbal cues   Transfers Chair/bed transfer   Chair/bed transfer method: Stand pivot Chair/bed transfer assist  level: Touching or steadying assistance (Pt > 75%) Chair/bed transfer assistive device: Armrests     Locomotion Ambulation     Max distance: 150 ft Assist level: Supervision or verbal cues   Wheelchair Wheelchair activity did not occur: N/A        Cognition Comprehension Comprehension assist level: Follows complex conversation/direction with no assist  Expression Expression assist level: Expresses complex ideas: With extra time/assistive device  Social Interaction Social Interaction assist level: Interacts appropriately with others with medication or extra time (anti-anxiety,  antidepressant).  Problem Solving Problem solving assist level: Solves complex problems: With extra time  Memory Memory assist level: Recognizes or recalls 90% of the time/requires cueing < 10% of the time   Medical Problem List and Plan: 1.Debilitysecondary to ARDS/VDRF relatedto influenza A -Continue therapies.  Patient is quite motivated 2. DVT Prophylaxis/Anticoagulation: Subcutaneous Lovenox.  Doppler studies negative for DVT 3. Pain Management:Tylenol as needed 4. Mood:Provide emotional support 5. Neuropsych: This patientiscapable of making decisions on herown behalf. 6. Skin/Wound Care:Routine skin checks   -Continue foam dressing to gluteal wounds.  These are very superficial.  A gentle scrub with moistened gauze daily to remove debris would be helpful.  Otherwise keep area dry 7. Fluids/Electrolytes/Nutrition:Routine I&O's with follow-up chemistriesupon admit -removedPICC   -Add potassium supplement for hypokalemia 8.Tracheostomy 09/02/2017.  Patient with no issues after trach became dislodged and then later removed.  She does need an occlusive dressing in place which helps prevent air removal through the stoma.  Marland Kitchen   9.Dysphagia.Dysphagia #3 nectar thickliquids.  Advance per speech therapy.  10.Hypertension. Norvasc 10 mg daily   -Low-dose Lopressor 12.5 mg  twice daily initiated for better control 11.Anemia due to critical illness.  Hemoglobin stable at 10.8 12.  Elevated LFTs: Likely reactive.  Follow-up prior to discharge   LOS (Days) 2 A [place] T, MD 09/12/2017 11:11 AM

## 2017-09-12 NOTE — Progress Notes (Signed)
Occupational Therapy Session Note  Patient Details  Name: Cassie [name] MRN: [id number] Date of Birth: 02/14/1966  Today's Date: 09/12/2017 OT Individual Time: 1655-3748 OT Individual Time Calculation (min): 58 min    Short Term Goals: Week 1:  OT Short Term Goal 1 (Week 1): STG=LTG due to LOS  Skilled Therapeutic Interventions/Progress Updates:    Upon entering the room, pt seated on EOB awaiting therapist with no c/o pain this session. Skilled OT intervention with focus on functional mobility, transfers, and home set up. Pt ambulated 150' without use of AD and overall supervision for safety. Pt taking seated rest break. Pt performed various transfers in ADL apartment such as transferring on and off of love seat sofa and rocking recliner chair similar to home environment with overall supervision. Pt initially declined shower equipment and reports, " I take a bath and I can get in and out of tub." OT asked pt to demonstrate. Pt able to get into tub with steady assistance but unable to transfer from tub without max A to stand from seated surface. OT demonstrated transfer onto TTB and pt returned demonstration with close supervision. Pt agreeable to shower equipment and ambulated back to room in similar manner as above. Call bell and all needs within reach. Bed alarm activated.   Therapy Documentation Precautions:  Precautions Precautions: Fall Restrictions Weight Bearing Restrictions: No  See Function Navigator for Current Functional Status.   Therapy/Group: Individual Therapy  [name] 09/12/2017, 12:43 PM

## 2017-09-12 NOTE — Progress Notes (Signed)
Occupational Therapy Session Note  Patient Details  Name: Cassie [name] MRN: [id number] Date of Birth: 1966/04/11  Today's Date: 09/12/2017 OT Individual Time: 8921-1941 OT Individual Time Calculation (min): 31 min   Short Term Goals: Week 1:  OT Short Term Goal 1 (Week 1): STG=LTG due to LOS  Skilled Therapeutic Interventions/Progress Updates:    Pt greeted supine in bed. Requesting to complete oral care and toileting. Tx focus on dynamic standing, functional ambulation without device, and activity tolerance. Pt ambulated with HHA to sink to complete oral care/grooming tasks, then to toilet to void bladder. Pt completing 3/3 components of task with steady assist, standing for extra time for RN to inspect buttocks and apply clean sacral dressing. She then returned to EOB for a rest break. Pt doffing socks and sneakers to apply lotion to feet. She required extra time to don footwear again but able to complete herself, including tying laces. Next, she ambulated to RN station with HHA to retrieve ginger ale, then returned to room. Pt thickening ginger ale with supervision to achieve nectar thick consistency. She was left EOB with all needs within reach.   OT also tethered yellow theraband to bed for pt to work on gentle UE therex/endurance outside of therapies. Educated her on exercise techniques with verbalized understanding.   Therapy Documentation Precautions:  Precautions Precautions: Fall Restrictions Weight Bearing Restrictions: No Vital Signs: Therapy Vitals Temp: 99.6 F (37.6 C) Temp Source: Oral Pulse Rate: (!) 105 Resp: 18 BP: (!) 146/89 Patient Position (if appropriate): Sitting Oxygen Therapy SpO2: 96 % O2 Device: Not Delivered Pain: No c/o pain during session    ADL:    See Function Navigator for Current Functional Status.   Therapy/Group: Individual Therapy  Kaelyn Nauta A Tuwana Kapaun 09/12/2017, 4:58 PM

## 2017-09-12 NOTE — Evaluation (Signed)
Speech Language Pathology Assessment and Plan  Patient Details  Name: Cassie [name] MRN: [id number] Date of Birth: 1966-07-04  SLP Diagnosis: Voice disorder;Dysphagia  Rehab Potential: Excellent ELOS: 7 days    Today's Date: 09/12/2017 SLP Individual Time: 1400-1500 SLP Individual Time Calculation (min): 60 min   Problem List:  Patient Active Problem List   Diagnosis Date Noted  . Acute respiratory distress syndrome (ARDS) ([place]) 09/10/2017  . Debility 09/10/2017  . Tachycardia   . Tracheostomy in place [facility])   . Oropharyngeal dysphagia   . Morbid obesity ([place])   . Tachypnea   . Reactive hypertension   . Prediabetes   . Hypokalemia   . Acute blood loss anemia   . Hyperglycemia 09/06/2017  . Sinus tachycardia 09/06/2017  . Hypernatremia 09/06/2017  . Acute respiratory failure with hypoxemia ([place])   . Tracheostomy status ([place])   . Transaminitis 08/19/2017  . Electrolyte imbalance 08/19/2017  . Influenza B   . ARDS (adult respiratory distress syndrome) ([place])   . Influenza A 08/18/2017  . Acute lung injury 08/18/2017  . Multifocal pneumonia 08/17/2017  . Acute respiratory failure ([place]) 08/17/2017  . Sepsis ([place]) 08/17/2017  . Hypothyroidism    Past Medical History:  Past Medical History:  Diagnosis Date  . Hypothyroidism    Past Surgical History:  Past Surgical History:  Procedure Laterality Date  . DENTAL SURGERY      Assessment / Plan / Recommendation Clinical Impression Cassie [name] a 52 y.o.right handed femalewith history of hypothyroid, obesity, recent travel.Per chart review patient lives with spouse. Independent prior to admission. Admitted on 08/17/17 with acute hypoxic respiratory failure due to severe influenza A superimposed on pneumonia. History taken from chart review.She was treated with IVIG X 2 v/s placebo for influenza A phase two study. She continued to have fevers and was treated with antibiotics, Tamiflu as well as IV lasix  but developed ARDS with septic shock requiring intubation. She had difficulty with extubation attempts due to stridor requiring tracheostomy on 2/8. She was extubated to ATC and downsized to#4Cuffless02/15/2019 and was decannulated on 09/12/2017.Diet has been advanced to dysphagia #3 nectar liquids.Subcutaneous Lovenox for DVT prophylaxis. PTand OTevaluationscompletedand CIR recommended due to debility.Patient was admitted for a comprehensive rehabilitation program with voice evaluation and bedside swallow evaluations completed on 09/12/17.   Pt presents with functional cognitive linguistic skills. Pt is s/p lengthy intubation and present hoarse vocal quality and low vocal intensity impacting speech intelligibility at the sentence to conversation level (~75% intelligibility in quiet environment and ~ 50% in noisy environment). RMST performed to assess for any deficits in respiratory function that might contribute to decreased vocal intensity. Pt with deficits in both expiratory and inspiratory strength. Pt obtained value of 64cmH2O for expiratory strength and 35 cmH2O for inspiratory strength. Pt's expiratory reference is 86 cmH2O (LLN is 65) and inspiratory reference is 76 cmH2O (LLN is 36). Pt would benefit from RMST to increase speech intelligibility. Pt consumed thin liquids via cup and straw along with trials of regular diet textures without overt s/s of aspiration. Pt appears at reduced risk for aspiration with regular diet and thin liquids given, increased endurance, decannulation and current ambulatory status. Recommend diet upgrade with ST to follow for diet safety. Skilled ST is required to target voice and diet safety thereby increasing functional independence and reducing caregiver burden.     Skilled Therapeutic Interventions          Skilled treatment session focused on completion of voice and BSE, see  above. RMST evaluation was also completed.    SLP Assessment  Patient will need  skilled Speech Lanaguage Pathology Services during CIR admission    Recommendations  SLP Diet Recommendations: Age appropriate regular solids;Thin Liquid Administration via: Cup;Straw Medication Administration: Whole meds with liquid Supervision: Patient able to self feed;Intermittent supervision to cue for compensatory strategies Compensations: Slow rate;Small sips/bites Postural Changes and/or Swallow Maneuvers: Seated upright 90 degrees Oral Care Recommendations: Oral care BID Patient destination: Home Follow up Recommendations: Home Health SLP Equipment Recommended: None recommended by SLP    SLP Frequency 3 to 5 out of 7 days   SLP Duration  SLP Intensity  SLP Treatment/Interventions 7 days  Minumum of 1-2 x/day, 30 to 90 minutes  Dysphagia/aspiration precaution training;Speech/Language facilitation;Patient/family education    Pain    Prior Functioning Cognitive/Linguistic Baseline: Within functional limits Type of Home: Apartment  Lives With: Significant other Available Help at Discharge: Family;Available 24 hours/day Vocation: Full time employment  Function:  Eating Eating   Modified Consistency Diet: No Eating Assist Level: No help, No cues           Cognition Comprehension Comprehension assist level: Follows complex conversation/direction with no assist  Expression   Expression assist level: Expresses complex ideas: With extra time/assistive device  Social Interaction Social Interaction assist level: Interacts appropriately with others with medication or extra time (anti-anxiety, antidepressant).  Problem Solving Problem solving assist level: Solves complex problems: With extra time  Memory Memory assist level: Recognizes or recalls 90% of the time/requires cueing < 10% of the time   Short Term Goals: Week 1: SLP Short Term Goal 1 (Week 1): Pt will independently consume regular diet iwth thin liquids and no overt s/s of aspiration.  SLP Short Term Goal  2 (Week 1): Pt will increase vocal intensity to achieve ~ 75% speech intelligibility at the sentence level.  SLP Short Term Goal 3 (Week 1): Pt will perform 3 sets of 10 repetitions of IMST set at 35 cmH2O with self-perceived effort level of 8 out of 10. SLP Short Term Goal 4 (Week 1): Pt will perform 3 sets of 10 repetitions of EMST set at 60 cmH2O with self-perceived effort level of 8 outof 10.   Refer to Care Plan for Long Term Goals  Recommendations for other services: None   Discharge Criteria: Patient will be discharged from SLP if patient refuses treatment 3 consecutive times without medical reason, if treatment goals not met, if there is a change in medical status, if patient makes no progress towards goals or if patient is discharged from hospital.  The above assessment, treatment plan, treatment alternatives and goals were discussed and mutually agreed upon: by patient  Darneshia Demary 09/12/2017, 6:06 PM

## 2017-09-12 NOTE — Plan of Care (Signed)
  Progressing Consults RH GENERAL PATIENT EDUCATION Description See Patient Education module for education specifics. 09/12/2017 1343 - Progressing by Glean Salen, RN Skin Care Protocol Initiated - if Braden Score 18 or less Description If consults are not indicated, leave blank or document N/A 09/12/2017 1343 - Progressing by Glean Salen, RN RH SKIN INTEGRITY RH STG SKIN FREE OF INFECTION/BREAKDOWN Description Patients skin will remain free from further breakdown or infection with mod assist.  09/12/2017 1343 - Progressing by Glean Salen, RN RH STG MAINTAIN SKIN INTEGRITY WITH ASSISTANCE Description STG Maintain Skin Integrity With mod Assistance.  09/12/2017 1343 - Progressing by Glean Salen, RN Flowsheets Taken 09/12/2017 1343  STG: Maintain skin integrity with assistance 4-Minimal assistance RH STG ABLE TO PERFORM INCISION/WOUND CARE W/ASSISTANCE Description STG Able To Perform Incision/Wound Care With mod Assistance.  09/12/2017 1343 - Progressing by Glean Salen, RN Flowsheets Taken 09/12/2017 1343  STG: Pt will be able to perform incision/wound care with assistance 4-Minimal assistance RH SAFETY RH STG ADHERE TO SAFETY PRECAUTIONS W/ASSISTANCE/DEVICE Description STG Adhere to Safety Precautions With Mod I Assistance/Device.  09/12/2017 1343 - Progressing by Glean Salen, RN Flowsheets Taken 09/12/2017 1343  STG:Pt will adhere to safety precautions with assistance/device 4-Minimal assistance RH PAIN MANAGEMENT RH STG PAIN MANAGED AT OR BELOW PT'S PAIN GOAL Description < 3  09/12/2017 1343 - Progressing by Glean Salen, RN

## 2017-09-12 NOTE — Progress Notes (Addendum)
Physical Therapy Session Note  Patient Details  Name: Cassie [name] MRN: [id number] Date of Birth: [birth year]  Today's Date: 09/12/2017 PT Individual Time: 0810-0904 PT Individual Time Calculation (min): 54 min   Short Term Goals: Week 1:  PT Short Term Goal 1 (Week 1): STG=LTG due to ELOS   Skilled Therapeutic Interventions/Progress Updates:  Pt received sitting on EOB & agreeable to tx. Discussed possible outing with recreational therapist & therapist prior to d/c & pt agreeable if time allows prior to d/c. Pt ambulates room<>gym without AD & steady assist progressing to supervision with decreased step length, decreased stride length, and decreased gait speed. Pt completed Berg Balance Test & scored 35/56; educated pt on interpretation of score & current fall risk. Patient demonstrates increased fall risk as noted by score of 35/56 on Berg Balance Scale.  (<36= high risk for falls, close to 100%; 37-45 significant >80%; 46-51 moderate >50%; 52-55 lower >25%). Pt transferred to supine on mat table & performed BLE bridging with adductor hold for glute & hip strengthening. Pt unable to fully extend hips during exercise. At end of session pt left sitting on EOB with alarm set & all needs within reach.   SpO2 remained greater than or equal to 96% on room air throughout session.   Therapy Documentation Precautions:  Precautions Precautions: Fall Restrictions Weight Bearing Restrictions: No  Pain: Denies c/o pain.  Balance: Balance Balance Assessed: Yes Standardized Balance Assessment Standardized Balance Assessment: Berg Balance Test Berg Balance Test Sit to Stand: Able to stand without using hands and stabilize independently Standing Unsupported: Able to stand 2 minutes with supervision Sitting with Back Unsupported but Feet Supported on Floor or Stool: Able to sit 30 seconds Stand to Sit: Uses backs of legs against chair to control descent Transfers: Able to transfer with verbal  cueing and /or supervision Standing Unsupported with Eyes Closed: Able to stand 10 seconds with supervision Standing Ubsupported with Feet Together: Able to place feet together independently and stand for 1 minute with supervision From Standing, Reach Forward with Outstretched Arm: Can reach forward >12 cm safely (5") From Standing Position, Pick up Object from Floor: Able to pick up shoe, needs supervision From Standing Position, Turn to Look Behind Over each Shoulder: Looks behind from both sides and weight shifts well Turn 360 Degrees: Needs close supervision or verbal cueing(completes turns to both sides with close supervision) Standing Unsupported, Alternately Place Feet on Step/Stool: Able to complete 4 steps without aid or supervision Standing Unsupported, One Foot in Front: Able to take small step independently and hold 30 seconds Standing on One Leg: Tries to lift leg/unable to hold 3 seconds but remains standing independently Total Score: 35   See Function Navigator for Current Functional Status.   Therapy/Group: Individual Therapy  Waunita Schooner 09/12/2017, 9:17 AM

## 2017-09-12 NOTE — IPOC Note (Signed)
Overall Plan of Care [facility]) Patient Details Name: Nana Vastine MRN: [id number] DOB: 02-17-1966  Admitting Diagnosis: [place] [facility] Problems: Principal Problem:   Debility Active Problems:   Tracheostomy status ([place])   Reactive hypertension   Acute blood loss anemia   Acute respiratory distress syndrome (ARDS) ([facility])     Functional Problem List: Nursing Skin Integrity, Pain, Nutrition, Endurance, Edema  PT Balance, Edema, Endurance, Motor, Nutrition, Pain, Safety, Skin Integrity  OT Balance, Safety, Endurance  SLP    TR         Basic ADL's: OT Grooming, Bathing, Dressing, Toileting     Advanced  ADL's: OT       Transfers: PT Bed Mobility, Bed to Chair, Car, [employer], Futures trader, Metallurgist: PT Ambulation, Emergency planning/management officer, Stairs     Additional Impairments: OT None  SLP        TR      Anticipated Outcomes Item Anticipated Outcome  Self Feeding Mod I  Swallowing      Basic self-care  Mod I  Toileting  Mod I   Bathroom Transfers Mod I  Bowel/Bladder  Mod I  Transfers  Mod I with LRAD   Locomotion  Mod I with LRAD for household distances.   Communication     Cognition     Pain  < 3  Safety/Judgment  Mod I   Therapy Plan: PT Intensity: Minimum of 1-2 x/day ,45 to 90 minutes PT Frequency: 5 out of 7 days PT Duration Estimated Length of Stay: 5-7 days  OT Intensity: Minimum of 1-2 x/day, 45 to 90 minutes OT Frequency: 5 out of 7 days OT Duration/Estimated Length of Stay: 7-10 days      Team Interventions: Nursing Interventions Patient/Family Education, Disease Management/Prevention, Skin Care/Wound Management, Medication Management, Pain Management, Dysphagia/Aspiration Precaution Training  PT interventions Ambulation/gait training, Training and development officer, Cognitive remediation/compensation, Community reintegration, Discharge planning, Disease management/prevention, DME/adaptive equipment instruction,  Functional mobility training, Functional electrical stimulation, Neuromuscular re-education, Pain management, Patient/family education, Psychosocial support, Skin care/wound management, Stair training, Therapeutic Activities, Therapeutic Exercise, UE/LE Strength taining/ROM, UE/LE Coordination activities, Visual/perceptual remediation/compensation, Wheelchair propulsion/positioning  OT Interventions Self Care/advanced ADL retraining, Therapeutic Activities, UE/LE Coordination activities, Therapeutic Exercise, Patient/family education, Functional mobility training, Community reintegration, Engineer, drilling, UE/LE Strength taining/ROM  SLP Interventions    TR Interventions    SW/CM Interventions Discharge Planning, Psychosocial Support, Patient/Family Education   Barriers to Discharge MD  Medical stability  Nursing Trach    PT Inaccessible home environment, Decreased caregiver support, Medical stability, Therapist, nutritional    OT      SLP      SW       Team Discharge Planning: Destination: PT-Home ,OT- Home , SLP-  Projected Follow-up: PT-Outpatient PT, OT-  None, SLP-  Projected Equipment Needs: PT-To be determined, Cane, OT- To be determined, SLP-  Equipment Details: PT- , OT-  Patient/family involved in discharge planning: PT- Patient, Family member/caregiver,  OT-Patient, SLP-   MD ELOS: 7-10 days Medical Rehab Prognosis:  Excellent Assessment: The patient has been admitted for CIR therapies with the diagnosis of debility after respiratory failure. The team will be addressing functional mobility, strength, stamina, balance, safety, adaptive techniques and equipment, self-care, bowel and bladder mgt, patient and caregiver education, trach mgt, exercise tolerance, wound care, commnity reentry. Goals have been set at mod I for mobility and self-care.    Meredith Staggers, MD, [facility]      See Team Conference Notes  for weekly updates to the plan of care

## 2017-09-12 NOTE — Progress Notes (Signed)
O2 sats mid 90's during night without trach, RA. Slept better last night than Saturday night. Reports taking melatonin occasionally PTA. LOB couple times, while ambulating to BR. Concerned about current medical situation. "will I ever be normal again?" Foam dressing to buttocks. Cassie [name] A

## 2017-09-13 ENCOUNTER — Inpatient Hospital Stay (HOSPITAL_COMMUNITY): Payer: Self-pay | Admitting: Physical Therapy

## 2017-09-13 ENCOUNTER — Other Ambulatory Visit: Payer: Self-pay

## 2017-09-13 ENCOUNTER — Inpatient Hospital Stay (HOSPITAL_COMMUNITY): Payer: Self-pay

## 2017-09-13 ENCOUNTER — Inpatient Hospital Stay (HOSPITAL_COMMUNITY): Payer: Self-pay | Admitting: Occupational Therapy

## 2017-09-13 DIAGNOSIS — J80 Acute respiratory distress syndrome: Secondary | ICD-10-CM

## 2017-09-13 NOTE — Progress Notes (Signed)
[place] PHYSICAL MEDICINE & REHABILITATION     PROGRESS NOTE    Subjective/Complaints: Patient in bed sitting up.  Pleased with progress and feels that she is getting better by the day.  No new complaints this morning.  Happy that liquids were upgraded to thin. ROS: pt denies nausea, vomiting, diarrhea, cough, shortness of breath or chest pain     Objective: Vital Signs: Blood pressure (!) 163/87, pulse 97, temperature 98 F (36.7 C), temperature source Oral, resp. rate 18, weight 96.3 kg (212 lb 4.9 oz), last menstrual period 08/17/2017, SpO2 95 %. Vas [place] Lower Extremity Venous (dvt)  Result Date: 09/11/2017  Lower Venous Study Indication: Edema. Examination Guidelines: A complete evaluation includes B-mode imaging, spectral doppler, color doppler, and power doppler as needed of all accessible portions of each vessel. Bilateral testing is considered an integral part of a complete examination. Limited examinations for reoccurring indications may be performed as noted. The reflux portion of the exam is performed with the patient in reverse Trendelenburg.  Right Venous Findings: +---------+---------------+---------+-----------+----------+-------+          CompressibilityPhasicitySpontaneityPropertiesSummary +---------+---------------+---------+-----------+----------+-------+ CFV      Full           Yes      Yes                          +---------+---------------+---------+-----------+----------+-------+ FV Prox  Full                                                 +---------+---------------+---------+-----------+----------+-------+ FV Mid   Full                                                 +---------+---------------+---------+-----------+----------+-------+ FV DistalFull                                                 +---------+---------------+---------+-----------+----------+-------+ PFV      Full                                                  +---------+---------------+---------+-----------+----------+-------+ POP      Full           Yes      Yes                          +---------+---------------+---------+-----------+----------+-------+ PTV      Full                                                 +---------+---------------+---------+-----------+----------+-------+ PERO     Full                                                 +---------+---------------+---------+-----------+----------+-------+  Left Venous Findings: +---------+---------------+---------+-----------+----------+-------+          CompressibilityPhasicitySpontaneityPropertiesSummary +---------+---------------+---------+-----------+----------+-------+ CFV      Full           Yes      Yes                          +---------+---------------+---------+-----------+----------+-------+ FV Prox  Full                                                 +---------+---------------+---------+-----------+----------+-------+ FV Mid   Full                                                 +---------+---------------+---------+-----------+----------+-------+ FV DistalFull                                                 +---------+---------------+---------+-----------+----------+-------+ PFV      Full                                                 +---------+---------------+---------+-----------+----------+-------+ POP      Full           Yes      Yes                          +---------+---------------+---------+-----------+----------+-------+ PTV      Full                                                 +---------+---------------+---------+-----------+----------+-------+ PERO     Full                                                 +---------+---------------+---------+-----------+----------+-------+    Final Interpretation: Right: There is no evidence of deep vein thrombosis in the lower extremity. No cystic structure found in  the popliteal fossa. Left: There is no evidence of deep vein thrombosis in the lower extremity. No cystic structure found in the popliteal fossa.  *See table(s) above for measurements and observations. Electronically signed by Deitra Mayo on 09/11/2017 at 3:31:04 PM.   Recent Labs    09/10/17 1349 09/12/17 0626  WBC 12.3* 8.4  HGB 11.0* 10.8*  HCT 36.4 34.8*  PLT 307 252   Recent Labs    09/10/17 0923 09/10/17 1349 09/12/17 0626  NA 139  --  139  K 3.0*  --  3.1*  CL 103  --  102  GLUCOSE 187*  --  131*  BUN 12  --  7  CREATININE 0.61 0.60 0.57  CALCIUM 9.2  --  9.1   CBG (last 3)  No  results for input(s): GLUCAP in the last 72 hours.  Wt Readings from Last 3 Encounters:  09/13/17 96.3 kg (212 lb 4.9 oz)  09/10/17 94.6 kg (208 lb 9.6 oz)    Physical Exam:  HENT:  Head:Normocephalic.  Eyes:EOMare normal. Right eye exhibits no discharge. Left eye exhibitsno discharge.  Neck:  Trach stoma closed with occlusive dressing.  No visible or audible air leakage noted with phonation Cardiovascular:RRR without murmur. No JVD   Respiratory: CTA Bilaterally without wheezes or rales. Normal effort   EX:HBZJ.Bowel sounds are normal. She exhibitsno distension.  Musculoskeletal: She exhibits noedema,tendernessor deformity.  Psychiatric: She has anormal mood and affect. Herbehavior is normal.Thought contentnormal.  Skin. Warm and dry.  Small voltage slid over the upper gluteal crease with mild fibronecrotic debris.  There also are 3 2 mm patches of breakdown along the left gluteus.  Area general a bit irritated and macerated.--Gluteal area essentially unchanged.   Musculoskeletal: No edema or tenderness in extremities Neurological: She isalert. Motor: 4/5 UE prox to distal. LE: 3+/5 HF and KE and 4/5 prox to distal. No sensory findings. Normal cognition, DTR's 1+, sensation normal. Psych: pleasant and appropriate     Assessment/Plan: 1.  Functional  deficits secondary to debility/respiratory failure which require 3+ hours per day of interdisciplinary therapy in a comprehensive inpatient rehab setting. Physiatrist is providing close team supervision and 24 hour management of active medical problems listed below. Physiatrist and rehab team continue to assess barriers to discharge/monitor patient progress toward functional and medical goals.  Function:  Bathing Bathing position   Position: Shower  Bathing parts Body parts bathed by patient: Right arm, Right lower leg, Left arm, Left lower leg, Chest, Abdomen, Front perineal area, Right upper leg, Left upper leg Body parts bathed by helper: Buttocks, Back  Bathing assist Assist Level: Touching or steadying assistance(Pt > 75%)      Upper Body Dressing/Undressing Upper body dressing   What is the patient wearing?: Hospital gown                Upper body assist Assist Level: Touching or steadying assistance(Pt > 75%)      Lower Body Dressing/Undressing Lower body dressing   What is the patient wearing?: Socks, Shoes         Non-skid slipper socks- Performed by patient: Don/doff right sock, Don/doff left sock   Socks - Performed by patient: Don/doff right sock, Don/doff left sock   Shoes - Performed by patient: Don/doff right shoe, Don/doff left shoe, Fasten right, Fasten left            Lower body assist Assist for lower body dressing: Touching or steadying assistance (Pt > 75%)      Toileting Toileting   Toileting steps completed by patient: Adjust clothing prior to toileting, Performs perineal hygiene, Adjust clothing after toileting   Toileting Assistive Devices: Grab bar or rail  Toileting assist Assist level: Touching or steadying assistance (Pt.75%)   Transfers Chair/bed transfer   Chair/bed transfer method: Ambulatory Chair/bed transfer assist level: Supervision or verbal cues Chair/bed transfer assistive device: Armrests     Locomotion Ambulation      Max distance: 150 ft Assist level: Supervision or verbal cues   Wheelchair Wheelchair activity did not occur: N/A        Cognition Comprehension Comprehension assist level: Follows complex conversation/direction with no assist  Expression Expression assist level: Expresses complex ideas: With extra time/assistive device  Social Interaction Social Interaction assist level: Interacts appropriately with others  with medication or extra time (anti-anxiety, antidepressant).  Problem Solving Problem solving assist level: Solves complex problems: With extra time  Memory Memory assist level: Recognizes or recalls 90% of the time/requires cueing < 10% of the time   Medical Problem List and Plan: 1.Debilitysecondary to ARDS/VDRF relatedto influenza A -Continue therapies.  Team conference today.  Patient making swift progress 2. DVT Prophylaxis/Anticoagulation: Subcutaneous Lovenox.  Doppler studies negative for DVT 3. Pain Management:Tylenol as needed 4. Mood:Provide emotional support 5. Neuropsych: This patientiscapable of making decisions on herown behalf. 6. Skin/Wound Care:Routine skin checks   -Continue foam dressing to gluteal wounds.  These are very superficial.  gentle scrub with moistened gauze daily to remove debris would be helpful.  Otherwise keep area dry 7. 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  Patient with no issues after trach became dislodged and then later removed.     -Continue occlusive dressing.  Patient tolerating decannulation without any issue.   9.Dysphagia.Dysphagia #3 advance to thin liquids by speech 10.Hypertension. Norvasc 10 mg daily   -Low-dose Lopressor 12.5 mg twice daily initiated with improved blood pressure control 11.Anemia due to critical illness.  Hemoglobin stable at 10.8 12.   Elevated LFTs: Likely reactive.  Follow-up prior to discharge   LOS (Days) [place] T, MD 09/13/2017 9:09 AM

## 2017-09-13 NOTE — Progress Notes (Signed)
Physical Therapy Session Note  Patient Details  Name: Cassie [name] MRN: [id number] Date of Birth: Mar 05, 1966  Today's Date: 09/13/2017 PT Individual Time: 1478-2956 and 1405-1530 PT Individual Time Calculation (min): 40 min and 25 min   Short Term Goals: Week 1:  PT Short Term Goal 1 (Week 1): STG=LTG due to ELOS   Skilled Therapeutic Interventions/Progress Updates:  Treatment 1: Pt received in room & agreeable to tx, no c/o pain reported. Pt on room air throughout session & SpO2 remained > or = 96%. Pt ambulated unit<>gift shop with supervision progressing to mod I for endurance training & to increase activity tolerance. After ambulating around gift shop pt required seated rest break 2/2 fatigue. Pt asking questions regarding recovery with therapist providing education regarding this and activities to engage in at home. Pt reports she will be by herself from ~4pm-midnight while her fiance is at work. Back on unit therapist provided pt with energy conservation handouts & reviewed information with pt. Also discussed potential outing tomorrow & pt agreeable. At end of session pt left sitting on EOB with alarm set & all needs within reach.   Treatment 2: Pt received in room & agreeable to tx, no c/o pain reported. Discussed anticipated d/c date, f/u OPPT, and need for supervision with stair negotiation at home (pt reports she has 19 steps with R rail to 2nd floor apartment). Pt ambulates throughout unit without AD & distant supervision. Pt negotiates 8 steps + 8 steps (6") laterally with BUE supported on R rail with seated rest breaks in between 2/2 fatigue. Pt reports her limbs just feel heavy. Pt completed sit<>stand transfers without BUE support for BLE strengthening. Pt completed floor transfer with extra time, significant BUE support to push to sitting on EOM, and supervision overall. At end of session pt left sitting on EOB with alarm set & all needs within reach. Pt's SpO2 remained > or = 96%  on room air throughout session.   Therapy Documentation Precautions:  Precautions Precautions: Fall Restrictions Weight Bearing Restrictions: No   See Function Navigator for Current Functional Status.   Therapy/Group: Individual Therapy  Waunita Schooner 09/13/2017, 2:39 PM

## 2017-09-13 NOTE — Progress Notes (Signed)
Patient information reviewed and entered into [facility] system by Jackeline Gutknecht, RN, CRRN, PPS Coordinator.  Information including medical coding and functional independence measure will be reviewed and updated through discharge.    

## 2017-09-13 NOTE — Progress Notes (Signed)
Physical Therapy Session Note  Patient Details  Name: Cassie [name] MRN: [id number] Date of Birth: 05-May-1966  Today's Date: 09/13/2017 PT Individual Time: 4159-0172 PT Individual Time Calculation (min): 40 min   Short Term Goals: Week 1:  PT Short Term Goal 1 (Week 1): STG=LTG due to ELOS   Skilled Therapeutic Interventions/Progress Updates:   Pt sitting EOB and agreeable to therapy, denies pain. Total assist w/c transport to gym. Focused on LE strengthening and eccentric quad control w/ negotiating steps. Negotiated 8, 6", steps w/ R handrail support as per home set up w/ min guard. Pt has 13 steps total, but moderate increase in work of breathing w/ 8 and poor eccentric control while descending. Worked on descending movement pattern, single leg bend while tapping forward w/ contralateral heel, 10x2 bilaterally. Performed same task from 1" step, 10x2 w/ min guard. Performed kinetron in standing at 10 cm/sec, 30 sec x4 reps, to work on LE strengthening. Ambulated back to room, 100', min guard. Ended session sitting EOB, call bell within reach and all needs met.   Therapy Documentation Precautions:  Precautions Precautions: Fall Restrictions Weight Bearing Restrictions: No  See Function Navigator for Current Functional Status.   Therapy/Group: Individual Therapy  Ramiya Delahunty K Arnette 09/13/2017, 9:59 AM

## 2017-09-13 NOTE — Progress Notes (Signed)
Speech Language Pathology Daily Session Note  Patient Details  Name: Cassie [name] MRN: [id number] Date of Birth: [birth year]  Today's Date: 09/13/2017 SLP Individual Time: 3557-3220 SLP Individual Time Calculation (min): 27 min  Short Term Goals: Week 1: SLP Short Term Goal 1 (Week 1): Pt will independently consume regular diet iwth thin liquids and no overt s/s of aspiration.  SLP Short Term Goal 2 (Week 1): Pt will increase vocal intensity to achieve ~ 75% speech intelligibility at the sentence level.  SLP Short Term Goal 3 (Week 1): Pt will perform 3 sets of 10 repetitions of IMST set at 35 cmH2O with self-perceived effort level of 8 out of 10. SLP Short Term Goal 4 (Week 1): Pt will perform 3 sets of 10 repetitions of EMST set at 60 cmH2O with self-perceived effort level of 8 outof 10.   Skilled Therapeutic Interventions:Skilled ST services focused on speech skills. SLP facilitated vocal intensity skills with RMT devices, pt returned demonstration of IMST at 35 then adjusted to 37 cm H2O with percevied effortful level of 7 and EMST at 60cm H2O with self perceived effort level of 8. Pt completed 3 sets of 10 and verbally recall exercises instructions in order to carryover exercises outside of therapy time. Pt demonstarted 70% intellgibility at snetence level and recall speech intellgibility startgeties after initial instruction. Pt was left in room with call bell within reach. Reccomend to continue skilled ST services.      Function:  Eating Eating                 Cognition Comprehension Comprehension assist level: Follows complex conversation/direction with no assist  Expression   Expression assist level: Expresses complex 90% of the time/cues < 10% of the time  Social Interaction Social Interaction assist level: Interacts appropriately with others with medication or extra time (anti-anxiety, antidepressant).  Problem Solving Problem solving assist level: Solves complex  problems: With extra time  Memory Memory assist level: Recognizes or recalls 90% of the time/requires cueing < 10% of the time    Pain Pain Assessment Pain Assessment: No/denies pain  Therapy/Group: Individual Therapy  Pearlie Nies  [facility] 09/13/2017, 1:57 PM

## 2017-09-13 NOTE — Progress Notes (Signed)
Occupational Therapy Session Note  Patient Details  Name: Cassie [name] MRN: [id number] Date of Birth: 1966-07-23  Today's Date: 09/13/2017 OT Individual Time: 1500-1600 OT Individual Time Calculation (min): 60 min    Short Term Goals: Week 1:  OT Short Term Goal 1 (Week 1): STG=LTG due to LOS  Skilled Therapeutic Interventions/Progress Updates:     Upon entering the room, pt seated on EOB awaiting therapist with no c/o pain this session. Pt agreeable to OT intervention. Pt ambulating without use of AD in room to obtain clothing items with supervision. Pt transferred onto TTB for bathing at shower level. Pt remained seated on bench with lateral leans to wash buttocks and figure 4 position to wash feet and shave legs with overall supervision. Pt returning to sit on EOB to don clothing items with increased time secondary to fatigue and overall supervision for safety. Pt standing at sink for grooming tasks as well with supervision. OT continued education regarding energy conservation in regards to self care and also thinking about community outing planned for tomorrow. Pt remained supine in bed at end of session with call bell and all needed items within reach. Bed alarm activated.   Therapy Documentation Precautions:  Precautions Precautions: Fall Restrictions Weight Bearing Restrictions: No General:   Vital Signs: Therapy Vitals Temp: 98 F (36.7 C) Temp Source: Oral Pulse Rate: 95 Resp: 18 BP: (!) 153/82 Patient Position (if appropriate): Lying Oxygen Therapy SpO2: 95 % O2 Device: Not Delivered Pain: Pain Assessment Pain Assessment: No/denies pain  See Function Navigator for Current Functional Status.   Therapy/Group: Individual Therapy  [name] 09/13/2017, 4:22 PM

## 2017-09-14 ENCOUNTER — Inpatient Hospital Stay (HOSPITAL_COMMUNITY): Payer: Self-pay | Admitting: Physical Therapy

## 2017-09-14 ENCOUNTER — Inpatient Hospital Stay (HOSPITAL_COMMUNITY): Payer: Self-pay | Admitting: Occupational Therapy

## 2017-09-14 ENCOUNTER — Inpatient Hospital Stay (HOSPITAL_COMMUNITY): Payer: Self-pay | Admitting: Speech Pathology

## 2017-09-14 ENCOUNTER — Inpatient Hospital Stay (HOSPITAL_COMMUNITY): Payer: Self-pay | Admitting: *Deleted

## 2017-09-14 NOTE — Progress Notes (Signed)
Occupational Therapy Discharge Summary  Patient Details  Name: Cassie [name] MRN: [id number] Date of Birth: April 16, 1966    Patient has met 8 of 8 long term goals due to improved activity tolerance, improved balance, ability to compensate for deficits, improved attention, improved awareness and improved coordination.  Patient to discharge at overall Modified Independent level.    Reasons goals not met: all goals met  Recommendation:  No further OT services needed after discharge  Equipment: TTB  Reasons for discharge: treatment goals met  Patient/family agrees with progress made and goals achieved: Yes  OT Discharge Precautions/Restrictions  Precautions Precautions: Fall Vital Signs Therapy Vitals Temp: 98 F (36.7 C) Temp Source: Oral Pulse Rate: (!) 103 Resp: 16 BP: (!) 154/87 Patient Position (if appropriate): Sitting Oxygen Therapy SpO2: 98 % O2 Device: Not Delivered Pain Pain Assessment Pain Assessment: No/denies pain Vision Baseline Vision/History: Wears glasses Wears Glasses: Reading only Cognition Overall Cognitive Status: Within Functional Limits for tasks assessed Arousal/Alertness: Awake/alert Orientation Level: Oriented X4 Sensation Sensation Light Touch: Appears Intact Proprioception: Appears Intact Coordination Gross Motor Movements are Fluid and Coordinated: Yes Fine Motor Movements are Fluid and Coordinated: Yes Mobility  Bed Mobility Bed Mobility: Rolling Left;Rolling Right;Supine to Sit;Sit to Supine Rolling Right: 6: Modified independent (Device/Increase time) Rolling Left: 6: Modified independent (Device/Increase time) Supine to Sit: 6: Modified independent (Device/Increase time)  Trunk/Postural Assessment  Cervical Assessment Cervical Assessment: Within Functional Limits Thoracic Assessment Thoracic Assessment: Within Functional Limits Lumbar Assessment Lumbar Assessment: Within Functional Limits Postural Control Postural  Control: Within Functional Limits  Balance Balance Balance Assessed: Yes Static Sitting Balance Static Sitting - Level of Assistance: 6: Modified independent (Device/Increase time) Dynamic Sitting Balance Dynamic Sitting - Level of Assistance: 6: Modified independent (Device/Increase time) Static Standing Balance Static Standing - Level of Assistance: 6: Modified independent (Device/Increase time) Dynamic Standing Balance Dynamic Standing - Level of Assistance: 6: Modified independent (Device/Increase time) Extremity/Trunk Assessment RUE Assessment RUE Assessment: Within Functional Limits LUE Assessment LUE Assessment: Within Functional Limits   See Function Navigator for Current Functional Status.  Darleen Crocker P 09/14/2017, 5:08 PM

## 2017-09-14 NOTE — Progress Notes (Signed)
[place] PHYSICAL MEDICINE & REHABILITATION     PROGRESS NOTE    Subjective/Complaints: Feeling well. Had another good day in therapy.   ROS: pt denies nausea, vomiting, diarrhea, cough, shortness of breath or chest pain    Objective: Vital Signs: Blood pressure (!) 169/96, pulse (!) 102, temperature 97.8 F (36.6 C), temperature source Oral, resp. rate 18, weight 96.2 kg (212 lb 1.3 oz), last menstrual period 08/17/2017, SpO2 98 %. Vas [place] Lower Extremity Venous (dvt)  Result Date: 09/11/2017  Lower Venous Study Indication: Edema. Examination Guidelines: A complete evaluation includes B-mode imaging, spectral doppler, color doppler, and power doppler as needed of all accessible portions of each vessel. Bilateral testing is considered an integral part of a complete examination. Limited examinations for reoccurring indications may be performed as noted. The reflux portion of the exam is performed with the patient in reverse Trendelenburg.  Right Venous Findings: +---------+---------------+---------+-----------+----------+-------+          CompressibilityPhasicitySpontaneityPropertiesSummary +---------+---------------+---------+-----------+----------+-------+ CFV      Full           Yes      Yes                          +---------+---------------+---------+-----------+----------+-------+ FV Prox  Full                                                 +---------+---------------+---------+-----------+----------+-------+ FV Mid   Full                                                 +---------+---------------+---------+-----------+----------+-------+ FV DistalFull                                                 +---------+---------------+---------+-----------+----------+-------+ PFV      Full                                                 +---------+---------------+---------+-----------+----------+-------+ POP      Full           Yes      Yes                           +---------+---------------+---------+-----------+----------+-------+ PTV      Full                                                 +---------+---------------+---------+-----------+----------+-------+ PERO     Full                                                 +---------+---------------+---------+-----------+----------+-------+  Left Venous Findings: +---------+---------------+---------+-----------+----------+-------+  CompressibilityPhasicitySpontaneityPropertiesSummary +---------+---------------+---------+-----------+----------+-------+ CFV      Full           Yes      Yes                          +---------+---------------+---------+-----------+----------+-------+ FV Prox  Full                                                 +---------+---------------+---------+-----------+----------+-------+ FV Mid   Full                                                 +---------+---------------+---------+-----------+----------+-------+ FV DistalFull                                                 +---------+---------------+---------+-----------+----------+-------+ PFV      Full                                                 +---------+---------------+---------+-----------+----------+-------+ POP      Full           Yes      Yes                          +---------+---------------+---------+-----------+----------+-------+ PTV      Full                                                 +---------+---------------+---------+-----------+----------+-------+ PERO     Full                                                 +---------+---------------+---------+-----------+----------+-------+    Final Interpretation: Right: There is no evidence of deep vein thrombosis in the lower extremity. No cystic structure found in the popliteal fossa. Left: There is no evidence of deep vein thrombosis in the lower extremity. No cystic structure found in the  popliteal fossa.  *See table(s) above for measurements and observations. Electronically signed by Deitra Mayo on 09/11/2017 at 3:31:04 PM.   Recent Labs    09/12/17 0626  WBC 8.4  HGB 10.8*  HCT 34.8*  PLT 252   Recent Labs    09/12/17 0626  NA 139  K 3.1*  CL 102  GLUCOSE 131*  BUN 7  CREATININE 0.57  CALCIUM 9.1   CBG (last 3)  No results for input(s): GLUCAP in the last 72 hours.  Wt Readings from Last 3 Encounters:  09/14/17 96.2 kg (212 lb 1.3 oz)  09/10/17 94.6 kg (208 lb 9.6 oz)    Physical Exam:  HENT:  Head:Normocephalic.  Eyes:EOMare normal. Right eye exhibits no discharge.  Left eye exhibitsno discharge.  Neck:  Trach stoma closing Cardiovascular: Regular rate without JVD Respiratory: Clear with normal effort KN:LZJQ.Bowel sounds are normal. She exhibitsno distension.  Musculoskeletal: She exhibits noedema,tendernessor deformity.  Psychiatric: She has anormal mood and affect. Herbehavior is normal.Thought contentnormal.  Skin. Warm and dry.  Gluteal wounds stable. Musculoskeletal: No edema or tenderness in extremities Neurological: She isalert. Motor: 4/5 UE prox to distal. LE: 4/5 HF and KE and 4/5 prox to distal. No sensory findings. Normal cognition, DTR's 1+, sensation normal. Psych: pleasant and appropriate     Assessment/Plan: 1.  Functional deficits secondary to debility/respiratory failure which require 3+ hours per day of interdisciplinary therapy in a comprehensive inpatient rehab setting. Physiatrist is providing close team supervision and 24 hour management of active medical problems listed below. Physiatrist and rehab team continue to assess barriers to discharge/monitor patient progress toward functional and medical goals.  Function:  Bathing Bathing position   Position: Shower  Bathing parts Body parts bathed by patient: Right arm, Right lower leg, Left arm, Left lower leg, Chest, Abdomen, Front perineal  area, Right upper leg, Left upper leg, Buttocks Body parts bathed by helper: Back  Bathing assist Assist Level: Supervision or verbal cues      Upper Body Dressing/Undressing Upper body dressing   What is the patient wearing?: Pull over shirt/dress     Pull over shirt/dress - Perfomed by patient: Thread/unthread right sleeve, Thread/unthread left sleeve, Put head through opening, Pull shirt over trunk          Upper body assist Assist Level: Supervision or verbal cues      Lower Body Dressing/Undressing Lower body dressing   What is the patient wearing?: Non-skid slipper socks         Non-skid slipper socks- Performed by patient: Don/doff right sock, Don/doff left sock   Socks - Performed by patient: Don/doff right sock, Don/doff left sock   Shoes - Performed by patient: Don/doff right shoe, Don/doff left shoe, Fasten right, Fasten left            Lower body assist Assist for lower body dressing: Touching or steadying assistance (Pt > 75%)      Toileting Toileting   Toileting steps completed by patient: Performs perineal hygiene, Adjust clothing after toileting, Adjust clothing prior to toileting   Toileting Assistive Devices: Grab bar or rail  Toileting assist Assist level: Supervision or verbal cues   Transfers Chair/bed transfer   Chair/bed transfer method: Ambulatory Chair/bed transfer assist level: No Help, no cues, assistive device, takes more than a reasonable amount of time Chair/bed transfer assistive device: Armrests     Locomotion Ambulation     Max distance: 150 ft  Assist level: No help, No cues, assistive device, takes more than a reasonable amount of time   Wheelchair Wheelchair activity did not occur: N/A        Cognition Comprehension Comprehension assist level: Follows complex conversation/direction with no assist  Expression Expression assist level: Expresses complex 90% of the time/cues < 10% of the time  Social Interaction Social  Interaction assist level: Interacts appropriately with others with medication or extra time (anti-anxiety, antidepressant).  Problem Solving Problem solving assist level: Solves complex problems: With extra time  Memory Memory assist level: Recognizes or recalls 90% of the time/requires cueing < 10% of the time   Medical Problem List and Plan: 1.Debilitysecondary to ARDS/VDRF relatedto influenza A -Continue therapies.  Making progress toward goals   -We discussed discharge today.  I explained to her that she would not go home and return to work immediately.  Nor will she drive immediately.  I will see her in follow-up 1 week and discuss vocational and driving issues.  She does have 17-19 steps to enter her place of business but only 3 steps to enter home. 2. DVT Prophylaxis/Anticoagulation: Subcutaneous Lovenox.  Doppler studies negative for DVT 3. Pain Management:Tylenol as needed 4. Mood:Provide emotional support 5. Neuropsych: This patientiscapable of making decisions on herown behalf. 6. Skin/Wound Care:Routine skin checks   -Continue foam dressing to gluteal wounds.   7. Fluids/Electrolytes/Nutrition:Routine I&O's with follow-up chemistriesupon admit -removed PICC   -Added potassium supplement for hypokalemia--recheck tomorrow. 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  Patient with no issues after trach became dislodged and then later removed.     -Continue occlusive dressing.  Patient tolerating decannulation without any issue.   9.Dysphagia. Now on regular diet with thin liquids and tolerating nicely 10.Hypertension. Norvasc 10 mg daily   -Low-dose Lopressor 12.5 mg twice daily has been helpful.  Diastolic blood pressure creeping up once again.  Continue to monitor 11.Anemia due to critical illness.  Hemoglobin stable at 10.8 12.  Elevated LFTs: Likely reactive.  Follow-up prior tomorrow.   LOS (Days) 4 A [place] T, MD 09/14/2017 9:39 AM

## 2017-09-14 NOTE — Patient Care Conference (Signed)
Inpatient RehabilitationTeam Conference and Plan of Care Update Date: 09/13/2017   Time: 2:50 PM    Patient Name: Cassie [name]      Medical Record Number: [id number]  Date of Birth: 13-Jun-1966 Sex: Female         Room/Bed: 4W23C/4W23C-01 Payor Info: Payor: MEDICAID POTENTIAL / Plan: MEDICAID POTENTIAL / Product Type: *No Product type* /    Admitting Diagnosis: FLU  Admit Date/Time:  09/10/2017 12:55 PM Admission Comments: No comment available   Primary Diagnosis:  Debility Principal Problem: Debility  Patient Active Problem List   Diagnosis Date Noted  . Acute respiratory distress syndrome (ARDS) ([place]) 09/10/2017  . Debility 09/10/2017  . Tachycardia   . Tracheostomy in place [facility])   . Oropharyngeal dysphagia   . Morbid obesity ([place])   . Tachypnea   . Reactive hypertension   . Prediabetes   . Hypokalemia   . Acute blood loss anemia   . Hyperglycemia 09/06/2017  . Sinus tachycardia 09/06/2017  . Hypernatremia 09/06/2017  . Acute respiratory failure with hypoxemia ([place])   . Tracheostomy status ([place])   . Transaminitis 08/19/2017  . Electrolyte imbalance 08/19/2017  . Influenza B   . ARDS (adult respiratory distress syndrome) ([place])   . Influenza A 08/18/2017  . Acute lung injury 08/18/2017  . Multifocal pneumonia 08/17/2017  . Acute respiratory failure ([place]) 08/17/2017  . Sepsis ([place]) 08/17/2017  . Hypothyroidism     Expected Discharge Date: Expected Discharge Date: 09/16/17  Team Members Present: Physician leading conference: Dr. Alger Simons Social Worker Present: Lennart Pall, LCSW Nurse Present: Benjie Karvonen, RN PT Present: Lavone Nian, PT OT Present: Benay Pillow, OT SLP Present: Weston Anna, SLP PPS Coordinator present : Daiva Nakayama, RN, CRRN     Current Status/Progress Goal Weekly Team Focus  Medical   Debility related to respiratory failure after influenza  Maximize activity tolerance  Removal of trach, management of trach stoma.    Bowel/Bladder   Continent of B/B LBM 09/13/17  Continue to be continent of b/b with min assist  monitor pt for constipation assess for bm stand by assist to br   Swallow/Nutrition/ Hydration             ADL's   supervision - steady assistance for all self care and functional transfers  mod I overall  d/c planning, pt/family edu, self care retraining, balance, endurance, strengthening   Mobility   supervision<>steady assist overall without AD  mod I with LRAD  balance, strengthening, endurance, pt education, d/c planning    Communication             Safety/Cognition/ Behavioral Observations            Pain   pt denies any pain  pt will remain pain free tylenol prn  assess pt q shift and prn for pain medicate as ordered and reassess for effectiveness notify MD for unrelieved pain   Skin   shearing injury to left buttock- foam dressing decanulated trach site with foam dressing   trach site without any s/s of infection continued healing improvement of shearing injury no further breakdown  assess skin q shift and prn contnue treatment as ordered    Rehab Goals Patient on target to meet rehab goals: Yes *See Care Plan and progress notes for long and short-term goals.     Barriers to Discharge  Current Status/Progress Possible Resolutions Date Resolved   Physician    Medical stability        Continue management  of medical conditions to allow discharge home      [place] home environment;Decreased caregiver support;Medical stability;[name]                 OT                  SLP                SW                Discharge Planning/Teaching Needs:  plan home with intermittent support      Team Discussion:  [name] out and doing well.  Supervision currently with mod ind goals.  Did planned failure with tub today - recommend tub bench.  Steps at work a concern for pt due to poor endurance.  On track for d/c end of week.  Revisions to  Treatment Plan:  None    Continued Need for Acute Rehabilitation Level of Care: The patient requires daily medical management by a physician with specialized training in physical medicine and rehabilitation for the following conditions: Daily direction of a multidisciplinary physical rehabilitation program to ensure safe treatment while eliciting the highest outcome that is of practical value to the patient.: Yes Daily medical management of patient stability for increased activity during participation in an intensive rehabilitation regime.: Yes Daily analysis of laboratory values and/or radiology reports with any subsequent need for medication adjustment of medical intervention for : Pulmonary problems;Nutritional problems  Jakolby Sedivy 09/14/2017, 9:34 AM

## 2017-09-14 NOTE — Progress Notes (Signed)
Physical Therapy Session Note  Patient Details  Name: Cassie [name] MRN: [id number] Date of Birth: 27-Jun-1966  Today's Date: 09/14/2017 PT Individual Time: 6384-6659 PT Individual Time Calculation (min): 24 min   Short Term Goals: Week 1:  PT Short Term Goal 1 (Week 1): STG=LTG due to ELOS   Skilled Therapeutic Interventions/Progress Updates: Pt presented in bed with SLP present agreeable to therapy. Pt participated in ambulation throughout facility for increased endurance. Pt took elevator to first floor and ambulated around gift shop, pt then requesting to use bathroom. Pt went to public toilet mod I and ambulated to main entrance. Pt returned to unit without rest breaks and returned to room. Left pt at sink with needs met.      Therapy Documentation Precautions:  Precautions Precautions: Fall Restrictions Weight Bearing Restrictions: No General:   Vital Signs: Therapy Vitals Temp: 98 F (36.7 C) Temp Source: Oral Pulse Rate: (!) 103 Resp: 16 BP: (!) 154/87 Patient Position (if appropriate): Sitting Oxygen Therapy SpO2: 98 % O2 Device: Not Delivered   See Function Navigator for Current Functional Status.   Therapy/Group: Individual Therapy  Haelie Clapp  Charolette Bultman, PTA  09/14/2017, 3:24 PM

## 2017-09-14 NOTE — Progress Notes (Signed)
Physical Therapy Discharge Summary  Patient Details  Name: Cassie [name] MRN: [id number] Date of Birth: Mar 22, 1966  Today's Date: 09/14/2017   Patient has met 10 of 10 long term goals due to improved activity tolerance, improved balance, improved postural control, increased strength, ability to compensate for deficits, improved awareness and improved coordination.  Patient to discharge at an ambulatory level supervision for stair negotiation, mod I for ambulation without AD.     Reasons goals not met: n/a  Recommendation:  Patient will benefit from ongoing skilled PT services in outpatient setting to continue to advance safe functional mobility, address ongoing impairments in decreased strength & endurance, impaired balance, and minimize fall risk.  Equipment: No equipment provided  Reasons for discharge: treatment goals met  Patient/family agrees with progress made and goals achieved: Yes  PT Discharge Precautions/Restrictions Restrictions Weight Bearing Restrictions: No  Cognition Overall Cognitive Status: Within Functional Limits for tasks assessed Orientation Level: Oriented X4  Motor  Motor Motor - Discharge Observations: general weakness overall, BLE weakness   Mobility Bed Mobility Bed Mobility: Rolling Left;Rolling Right;Supine to Sit;Sit to Supine Rolling Right: 6: Modified independent (Device/Increase time) Rolling Left: 6: Modified independent (Device/Increase time) Supine to Sit: 6: Modified independent (Device/Increase time) Sit to Supine: 6: Modified independent (Device/Increase time) Transfers Transfers: Yes Sit to Stand: 6: Modified independent (Device/Increase time)  Locomotion  Ambulation Ambulation: Yes Ambulation/Gait Assistance: 6: Modified independent (Device/Increase time) Ambulation Distance (Feet): 500 Feet Assistive device: None Gait Gait Pattern: Decreased step length - right;Decreased step length - left;Decreased stride  length(decreased gait speed) Stairs / Additional Locomotion Stairs: Yes Stairs Assistance: 5: Supervision Stair Management Technique: (12 steps (6" + 3") with B rails and supervision, 4 steps (6") without rails and supervision with step-to pattern) Ramp: 6: Modified independent (Device) Curb: 5: Building services engineer Mobility: No   Balance Balance Balance Assessed: Yes Standardized Balance Assessment Standardized Balance Assessment: Furniture conservator/restorer Berg Balance Test Sit to Stand: Able to stand without using hands and stabilize independently Standing Unsupported: Able to stand safely 2 minutes Sitting with Back Unsupported but Feet Supported on Floor or Stool: Able to sit safely and securely 2 minutes Stand to Sit: Sits safely with minimal use of hands Transfers: Able to transfer safely, minor use of hands Standing Unsupported with Eyes Closed: Able to stand 10 seconds safely Standing Ubsupported with Feet Together: Able to place feet together independently and stand 1 minute safely From Standing, Reach Forward with Outstretched Arm: Can reach forward >12 cm safely (5") From Standing Position, Pick up Object from Floor: Able to pick up shoe safely and easily From Standing Position, Turn to Look Behind Over each Shoulder: Looks behind from both sides and weight shifts well Turn 360 Degrees: Needs close supervision or verbal cueing Standing Unsupported, Alternately Place Feet on Step/Stool: Able to stand independently and safely and complete 8 steps in 20 seconds(completes 8 steps in <15 seconds) Standing Unsupported, One Foot in Front: Able to place foot tandem independently and hold 30 seconds Standing on One Leg: Able to lift leg independently and hold equal to or more than 3 seconds Total Score: 50  Extremity Assessment  RUE Assessment RUE Assessment: Within Functional Limits LUE Assessment LUE Assessment: Within Functional Limits RLE Assessment RLE  Assessment: Within Functional Limits(general weakness) LLE Assessment LLE Assessment: Within Functional Limits(general weakness)   See Function Navigator for Current Functional Status.  Waunita Schooner 09/14/2017, 4:37 PM

## 2017-09-14 NOTE — Progress Notes (Signed)
Occupational Therapy Session Note  Patient Details  Name: Cassie [name] MRN: [id number] Date of Birth: March 23, 1966  Today's Date: 09/14/2017 OT Individual Time: 1030-1200 OT Individual Time Calculation (min): 90 min    Short Term Goals: Week 1:  OT Short Term Goal 1 (Week 1): STG=LTG due to LOS  Skilled Therapeutic Interventions/Progress Updates:    Pt engaged in community outing with focus on functional mobility without device, balance on uneven surfaces, safety awareness and potential fall risk. Pt performing all tasks at an overall mod I level. Pt made mod I in room once returning to unit with plans for upcoming discharge 2/20. Pt educated on energy conservation strategies for community mobility as well. Pt returning to room at end of session with all needs within reach.   Therapy Documentation Precautions:  Precautions Precautions: Fall Restrictions Weight Bearing Restrictions: No  Pain: Pain Assessment Pain Assessment: No/denies pain Pain Score: 0-No pain  See Function Navigator for Current Functional Status.   Therapy/Group: concurrent  [name] 09/14/2017, 12:41 PM

## 2017-09-14 NOTE — Progress Notes (Addendum)
Physical Therapy Session Note  Patient Details  Name: Cassie [name] MRN: [id number] Date of Birth: 1966/07/09  Today's Date: 09/14/2017 PT Individual Time: 0805-0905 PT Individual Time Calculation (min): 60 min   Short Term Goals: Week 1:  PT Short Term Goal 1 (Week 1): STG=LTG due to ELOS   Skilled Therapeutic Interventions/Progress Updates:  Pt received in room & agreeable to tx. No c/o pain reported. Discussed home set up & pt clarifies that she has 3 steps without rails to enter home but 19 steps with R rail to access apartment over her work. Upon discussion therapist & pt agree that since pt will not be returning to work immediately that she only has to negotiate 3 steps without rails to access home. Pt ambulates throughout unit without AD & mod I. Therapist provided instruction for step-to pattern and pt able to negotiate 4 steps without rails multiple times during session with close supervision. Educated pt on need for supervision with stair negotiation at home and educated her on proper positioning of caregiver with pt voicing understanding. Pt negotiated uneven surface & ramp with mod I, curb with supervision, and completed bed mobility with mod I. Pt completed Berg Balance Test & scored 50/56; educated pt on interpretation of score & current fall risk. Patient demonstrates increased fall risk as noted by score of 50/56 on Berg Balance Scale.  (<36= high risk for falls, close to 100%; 37-45 significant >80%; 46-51 moderate >50%; 52-55 lower >25%). Pt utilized nu-step on level 3 x 5 minutes with BLE only for endurance & strength training. Back in room pt with continent void on toilet with mod I. At end of session pt left sitting on EOB with all needs within reach & bed alarm set. Pt voices no concerns regarding d/c home this week. Educated pt on home modifications (remove throw rugs & tripping hazards).   Pt's SpO2 = 99% on room air during session.   Addendum: Pt completed car transfer at  elevated truck height - pt reports she uses a running board to step onto to access truck but with simulated seat height pt does not need to. Educated pt on need to sit then transfer legs into car as she demonstrated impaired balance when stepping in with 1 leg at a time. Pt able to return demonstrate with supervision.    Therapy Documentation Precautions:  Precautions Precautions: Fall Restrictions Weight Bearing Restrictions: No  Balance: Balance Balance Assessed: Yes Standardized Balance Assessment Standardized Balance Assessment: Berg Balance Test Berg Balance Test Sit to Stand: Able to stand without using hands and stabilize independently Standing Unsupported: Able to stand safely 2 minutes Sitting with Back Unsupported but Feet Supported on Floor or Stool: Able to sit safely and securely 2 minutes Stand to Sit: Sits safely with minimal use of hands Transfers: Able to transfer safely, minor use of hands Standing Unsupported with Eyes Closed: Able to stand 10 seconds safely Standing Ubsupported with Feet Together: Able to place feet together independently and stand 1 minute safely From Standing, Reach Forward with Outstretched Arm: Can reach forward >12 cm safely (5") From Standing Position, Pick up Object from Floor: Able to pick up shoe safely and easily From Standing Position, Turn to Look Behind Over each Shoulder: Looks behind from both sides and weight shifts well Turn 360 Degrees: Needs close supervision or verbal cueing Standing Unsupported, Alternately Place Feet on Step/Stool: Able to stand independently and safely and complete 8 steps in 20 seconds(completes 8 steps in <15 seconds) Standing  Unsupported, One Foot in Front: Able to place foot tandem independently and hold 30 seconds Standing on One Leg: Able to lift leg independently and hold equal to or more than 3 seconds Total Score: 50   See Function Navigator for Current Functional Status.   Therapy/Group:  Individual Therapy  Waunita Schooner 09/14/2017, 9:28 AM

## 2017-09-14 NOTE — Progress Notes (Signed)
Speech Language Pathology Session Note & Discharge Summary  Patient Details  Name: Cassie [name] MRN: [id number] Date of Birth: [birth year]  Today's Date: 09/14/2017 SLP Individual Time: 1300-1330 SLP Individual Time Calculation (min): 30 min   Skilled Therapeutic Interventions:  Skilled treatment session focused on speech goals. SLP facilitated session by providing supervision verbal cues for accuracy with RMT exercises. Patient performed 25 repetitions EMST exercises at 60 cm H2O and IMST exercises at 25 cm H2O with a self-perceived effort level of 8/10. The patient's IMST device was downgraded from 37 cm H2O in order for patient to perform exercises efficiently. Patient was ~90-100% intelligible in a quiet environment at the sentence level with Mod I. Patient left upright in bed with all needs within reach.    Patient has met 2 of 2 long term goals.  Patient to discharge at overall Modified Independent level.   Reasons goals not met: N/A    Clinical Impression/Discharge Summary: Patient has made excellent gains and has met 2 of 2 LTG's this admission. Currently, patient is consuming regular textures with thin liquids without overt s/s of aspiration with Mod I. Patient is also performing RMT exercises with supervision verbal cues and is ~90-100% intelligible at the sentence level with Mod I due to a low vocal intensity. Patient education is complete and patient will discharge home. Patient can perform RMT exercises independently and does not need f/u SLP services at this time, however, patient may benefit from possible ENT consult in the future if voice does not improve.   Care Partner:  Caregiver Able to Provide Assistance: Yes     Recommendation:  None      Equipment: N/A   Reasons for discharge: Treatment goals met   Patient/Family Agrees with Progress Made and Goals Achieved: Yes   Function:   Cognition Comprehension Comprehension assist level: Follows complex  conversation/direction with no assist  Expression   Expression assist level: Expresses complex 90% of the time/cues < 10% of the time  Social Interaction Social Interaction assist level: Interacts appropriately with others with medication or extra time (anti-anxiety, antidepressant).  Problem Solving Problem solving assist level: Solves complex problems: With extra time  Memory Memory assist level: Recognizes or recalls 90% of the time/requires cueing < 10% of the time   Robynn Marcel 09/14/2017, 3:07 PM

## 2017-09-15 ENCOUNTER — Encounter (HOSPITAL_COMMUNITY): Payer: Self-pay | Admitting: *Deleted

## 2017-09-15 ENCOUNTER — Inpatient Hospital Stay (HOSPITAL_COMMUNITY): Payer: Self-pay | Admitting: Speech Pathology

## 2017-09-15 LAB — COMPREHENSIVE METABOLIC PANEL
ALBUMIN: 3.1 g/dL — AB (ref 3.5–5.0)
ALK PHOS: 93 U/L (ref 38–126)
ALT: 86 U/L — AB (ref 14–54)
AST: 42 U/L — AB (ref 15–41)
Anion gap: 11 (ref 5–15)
BILIRUBIN TOTAL: 0.2 mg/dL — AB (ref 0.3–1.2)
CALCIUM: 9.6 mg/dL (ref 8.9–10.3)
CO2: 24 mmol/L (ref 22–32)
Chloride: 103 mmol/L (ref 101–111)
Creatinine, Ser: 0.64 mg/dL (ref 0.44–1.00)
GFR calc Af Amer: >60 mL/min (ref >60)
GFR calc non Af Amer: >60 mL/min (ref >60)
GLUCOSE: 136 mg/dL — AB (ref 65–99)
Potassium: 3.6 mmol/L (ref 3.5–5.1)
Sodium: 138 mmol/L (ref 135–145)
TOTAL PROTEIN: 6.6 g/dL (ref 6.5–8.1)

## 2017-09-15 MED ORDER — METOPROLOL TARTRATE 25 MG PO TABS: 12.5000 mg | ORAL_TABLET | Freq: Two times a day (BID) | ORAL | 1 refills | Status: DC

## 2017-09-15 MED ORDER — ALBUTEROL SULFATE HFA 108 (90 BASE) MCG/ACT IN AERS: 2.0000 | INHALATION_SPRAY | Freq: Four times a day (QID) | RESPIRATORY_TRACT | 2 refills | Status: DC | PRN

## 2017-09-15 MED ORDER — AMLODIPINE BESYLATE 10 MG PO TABS: 10.0000 mg | ORAL_TABLET | Freq: Every day | ORAL | 0 refills | Status: DC

## 2017-09-15 NOTE — Progress Notes (Signed)
Social Work  Discharge Note  The overall goal for the admission was met for:   Discharge location: Yes - home with fiance, Josh, able to provide supervision/ assist as needed.  Length of Stay: Yes -  5 days  Discharge activity level: Yes - modified independent  Home/community participation: Yes  Services provided included: MD, RD, PT, OT, SLP, RN, TR, Pharmacy and SW  Financial Services: uninsured  Follow-up services arranged: tub bench via [place] See prior SW note re: follow up therapy services.  Comments (or additional information):  Patient/Family verbalized understanding of follow-up arrangements: Yes  Individual responsible for coordination of the follow-up plan: pt  Confirmed correct DME delivered: Mirakle Tomlin 09/15/2017    Guthrie Lemme

## 2017-09-15 NOTE — Progress Notes (Addendum)
Discharged instructions given by Linna Hoff PA no further questions noted. Acute MD came to see patient before discharge.Family member in room. RN returned to room to discharge patient but patient was not in room per environmentalist patient went home. NT saw patient pushing cart and helped her along the way.

## 2017-09-15 NOTE — Progress Notes (Signed)
Title: A Randomized, Double-Blind, Placebo-Controlled Dose Ranging Study Evaluating the Safety Pharmacokinetics and Clinical Benefit of FLU-IGIV in Hospitalized Patients with Serious Influenza A infection. IA-001 (ClinicalTrials.gov Identifier: [id number], Protocol No: IA-001, [facility] Protocol #[id number])  RESEARCH SUBJECT. This research study is sponsored by [employer]   Protocol:  - amendment : #4 -> 16 Dec 2016   -  Administrative Change Memo - 03/JAN/2019  - Investigator Bronchure: 4.0 - 16 Dec 2016 - double checked to be correct YES  Clinical Research Coordinator note : This visit for Subject Cassie [name] with DOB: [birth year] on 09/15/2017 for the above protocol is Visit/Encounter # Day 33,  and is for purpose of Research.   In this visit Ordinal scale, Con meds, and ae's were assessed per above stated protocol. [name] has been removed from the patient and she was being released from rehab facility and send home. Patient states she is feeling much better. She was thanked for her contribution to science. Please refer to paper source binder for further details on todays visit.  Signed by  T. Early Chars BS  Clinical Research Coordinator [facility]  [place], [place] 2:[address] [place] 09/15/2017

## 2017-09-15 NOTE — Progress Notes (Signed)
Social Work Patient ID: Cassie [name], female   DOB: 1965/11/09, 52 y.o.   MRN: [id number]  Met with pt yesterday afternoon to discuss d/c as team feels she could d/c today instead of 2/22.  She is agreeable with this.  We also discussed recommendations of OP f/u therapies, however, as pt is uninsured she will have to privately pay for these visits.  Pt chooses to decline OP tx follow up at this time.  Notes she will discuss further with Dr. Naaman Plummer in her follow up appt with him in a couple of weeks. Therapists aware.   Cassie Rota, LCSW

## 2017-09-15 NOTE — Discharge Summary (Signed)
Discharge summary job [id number]

## 2017-09-15 NOTE — Progress Notes (Signed)
[facility] PHYSICAL MEDICINE & REHABILITATION     PROGRESS NOTE    Subjective/Complaints: Doing nicely. Excited to be going home.   ROS: pt denies nausea, vomiting, diarrhea, cough, shortness of breath or chest pain    Objective: Vital Signs: Blood pressure (!) 156/88, pulse 91, temperature 97.8 F (36.6 C), temperature source Oral, resp. rate 17, weight 96.5 kg (212 lb 11.9 oz), last menstrual period 08/17/2017, SpO2 96 %. Vas [place] Lower Extremity Venous (dvt)  Result Date: 09/11/2017  Lower Venous Study Indication: Edema. Examination Guidelines: A complete evaluation includes B-mode imaging, spectral doppler, color doppler, and power doppler as needed of all accessible portions of each vessel. Bilateral testing is considered an integral part of a complete examination. Limited examinations for reoccurring indications may be performed as noted. The reflux portion of the exam is performed with the patient in reverse Trendelenburg.  Right Venous Findings: +---------+---------------+---------+-----------+----------+-------+          CompressibilityPhasicitySpontaneityPropertiesSummary +---------+---------------+---------+-----------+----------+-------+ CFV      Full           Yes      Yes                          +---------+---------------+---------+-----------+----------+-------+ FV Prox  Full                                                 +---------+---------------+---------+-----------+----------+-------+ FV Mid   Full                                                 +---------+---------------+---------+-----------+----------+-------+ FV DistalFull                                                 +---------+---------------+---------+-----------+----------+-------+ PFV      Full                                                 +---------+---------------+---------+-----------+----------+-------+ POP      Full           Yes      Yes                           +---------+---------------+---------+-----------+----------+-------+ PTV      Full                                                 +---------+---------------+---------+-----------+----------+-------+ PERO     Full                                                 +---------+---------------+---------+-----------+----------+-------+  Left Venous Findings: +---------+---------------+---------+-----------+----------+-------+  CompressibilityPhasicitySpontaneityPropertiesSummary +---------+---------------+---------+-----------+----------+-------+ CFV      Full           Yes      Yes                          +---------+---------------+---------+-----------+----------+-------+ FV Prox  Full                                                 +---------+---------------+---------+-----------+----------+-------+ FV Mid   Full                                                 +---------+---------------+---------+-----------+----------+-------+ FV DistalFull                                                 +---------+---------------+---------+-----------+----------+-------+ PFV      Full                                                 +---------+---------------+---------+-----------+----------+-------+ POP      Full           Yes      Yes                          +---------+---------------+---------+-----------+----------+-------+ PTV      Full                                                 +---------+---------------+---------+-----------+----------+-------+ PERO     Full                                                 +---------+---------------+---------+-----------+----------+-------+    Final Interpretation: Right: There is no evidence of deep vein thrombosis in the lower extremity. No cystic structure found in the popliteal fossa. Left: There is no evidence of deep vein thrombosis in the lower extremity. No cystic structure found in the popliteal  fossa.  *See table(s) above for measurements and observations. Electronically signed by Deitra Mayo on 09/11/2017 at 3:31:04 PM.   No results for input(s): WBC, HGB, HCT, PLT in the last 72 hours. Recent Labs    09/15/17 0427  NA 138  K 3.6  CL 103  GLUCOSE 136*  BUN <5*  CREATININE 0.64  CALCIUM 9.6   CBG (last 3)  No results for input(s): GLUCAP in the last 72 hours.  Wt Readings from Last 3 Encounters:  09/15/17 96.5 kg (212 lb 11.9 oz)  09/10/17 94.6 kg (208 lb 9.6 oz)    Physical Exam:  HENT:  Head:Normocephalic.  Eyes:EOMare normal. Right eye exhibits no discharge. Left eye exhibitsno discharge.  Neck:  occ  dressing, voice improving Cardiovascular: RRR Respiratory: clear JQ:BHAL.Bowel sounds are normal. She exhibitsno distension.  Musculoskeletal: She exhibits noedema,tendernessor deformity.  Psychiatric: She has anormal mood and affect. Herbehavior is normal.Thought contentnormal.  Skin. Stoma closing. Gluteal wound about the size of a quarter +, superficial with minimal debris, slit essentially resolved in cleft. Musculoskeletal: No edema or tenderness in extremities Neurological: She isalert. Motor: 4/5 UE prox to distal. LE: 4/5 HF and KE and 4/5 prox to distal. No sensory findings. Normal cognition, DTR's 1+, sensation normal. Psych: pleasant and appropriate     Assessment/Plan: 1.  Functional deficits secondary to debility/respiratory failure which require 3+ hours per day of interdisciplinary therapy in a comprehensive inpatient rehab setting. Physiatrist is providing close team supervision and 24 hour management of active medical problems listed below. Physiatrist and rehab team continue to assess barriers to discharge/monitor patient progress toward functional and medical goals.  Function:  Bathing Bathing position   Position: Shower(per staff report)  Bathing parts Body parts bathed by patient: Right arm, Right lower  leg, Left arm, Left lower leg, Chest, Abdomen, Front perineal area, Right upper leg, Left upper leg, Buttocks Body parts bathed by helper: Back  Bathing assist Assist Level: More than reasonable time      Upper Body Dressing/Undressing Upper body dressing   What is the patient wearing?: Pull over shirt/dress     Pull over shirt/dress - Perfomed by patient: Thread/unthread right sleeve, Thread/unthread left sleeve, Put head through opening, Pull shirt over trunk(per staff report)          Upper body assist Assist Level: More than reasonable time      Lower Body Dressing/Undressing Lower body dressing   What is the patient wearing?: Non-skid slipper socks, Pants     Pants- Performed by patient: Thread/unthread right pants leg, Thread/unthread left pants leg, Pull pants up/down, Fasten/unfasten pants(per staff report)   Non-skid slipper socks- Performed by patient: Don/doff right sock, Don/doff left sock   Socks - Performed by patient: Don/doff right sock, Don/doff left sock   Shoes - Performed by patient: Don/doff right shoe, Don/doff left shoe, Fasten right, Fasten left            Lower body assist Assist for lower body dressing: More than reasonable time      Psychologist, occupational   Toileting steps completed by patient: Adjust clothing prior to toileting, Performs perineal hygiene, Adjust clothing after toileting   Toileting Assistive Devices: Grab bar or rail  Toileting assist Assist level: More than reasonable time   Transfers Chair/bed transfer   Chair/bed transfer method: Ambulatory Chair/bed transfer assist level: No help, no cues Chair/bed transfer assistive device: Armrests     Locomotion Ambulation     Max distance: 150 ft  Assist level: No help, No cues, assistive device, takes more than a reasonable amount of time   Wheelchair Wheelchair activity did not occur: N/A        Cognition Comprehension Comprehension assist level: Follows complex  conversation/direction with no assist  Expression Expression assist level: Expresses complex 90% of the time/cues < 10% of the time  Social Interaction Social Interaction assist level: Interacts appropriately with others with medication or extra time (anti-anxiety, antidepressant).  Problem Solving Problem solving assist level: Solves complex problems: With extra time  Memory Memory assist level: Recognizes or recalls 90% of the time/requires cueing < 10% of the time   Medical Problem List and Plan: 1.Debilitysecondary to ARDS/VDRF relatedto influenza A -    -dc  home today   -Patient to see Rehab MD/provider in the office for transitional care encounter in 1-2 weeks.    -no work for at least 2 weeks or until follow up visit. She has no primary md 2. DVT Prophylaxis/Anticoagulation: Subcutaneous Lovenox.  Doppler studies negative for DVT 3. Pain Management:Tylenol as needed 4. Mood:Provide emotional support 5. Neuropsych: This patientiscapable of making decisions on herown behalf. 6. Skin/Wound Care:Routine skin checks   -Continue foam dressing to gluteal wounds. --wounds healing nicely. Should be closed within the next days to week.  7. 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  Patient with no issues after trach became dislodged and then later removed.     -Continue occlusive dressing.  Stoma closing   9.Dysphagia. Now on regular diet with thin liquids and tolerating nicely 10.Hypertension. Norvasc 10 mg daily   -Low-dose Lopressor 12.5 mg twice daily has been helpful.  Continue current dosing once home.      11.Anemia due to critical illness.  Hemoglobin stable at 10.8 12.  Elevated LFTs: Likely reactive.  LFT's normalizing   LOS (Days) 5 A FACE TO FACE EVALUATION WAS PERFORMED  Meredith Staggers, MD 09/15/2017 9:52 AM

## 2017-09-15 NOTE — Progress Notes (Signed)
Social Work Social Work Assessment and Plan  Patient Details  Name: Cassie [name] MRN: [id number] Date of Birth: 1965/09/02  Today's Date: 09/13/2017  Problem List:  Patient Active Problem List   Diagnosis Date Noted  . Acute respiratory distress syndrome (ARDS) ([place]) 09/10/2017  . Debility 09/10/2017  . Tachycardia   . Tracheostomy in place [facility])   . Oropharyngeal dysphagia   . Morbid obesity ([place])   . Tachypnea   . Reactive hypertension   . Prediabetes   . Hypokalemia   . Acute blood loss anemia   . Hyperglycemia 09/06/2017  . Sinus tachycardia 09/06/2017  . Hypernatremia 09/06/2017  . Acute respiratory failure with hypoxemia ([place])   . Tracheostomy status ([place])   . Transaminitis 08/19/2017  . Electrolyte imbalance 08/19/2017  . Influenza B   . ARDS (adult respiratory distress syndrome) ([place])   . Influenza A 08/18/2017  . Acute lung injury 08/18/2017  . Multifocal pneumonia 08/17/2017  . Acute respiratory failure ([place]) 08/17/2017  . Sepsis ([place]) 08/17/2017  . Hypothyroidism    Past Medical History:  Past Medical History:  Diagnosis Date  . Hypothyroidism    Past Surgical History:  Past Surgical History:  Procedure Laterality Date  . DENTAL SURGERY     Social History:  reports that  has never smoked. she has never used smokeless tobacco. She reports that she does not drink alcohol or use drugs.  Family / Support Systems Marital Status: Single Patient Roles: Partner Spouse/Significant OtherKonrad Felix @ [phone] Anticipated Caregiver: Josh Ability/Limitations of Caregiver: Merrily Pew also manages their buisness Caregiver Availability: 24/7 Family Dynamics: Pt describes very supportive relationship with fiance and denies any concerns about support at d/c.  Social History Preferred language: English Religion:  Cultural Background: NA Education: college Read: Yes Write: Yes Employment Status: Employed Name of Employer: owner of [employer] Return to Work Plans: Pt fully intends to return as soon as she feels "up to it." Freight forwarder Issues: None Guardian/Conservator: None - per MD, pt is capable of making decisions on her own behalf.   Abuse/Neglect Abuse/Neglect Assessment Can Be Completed: Yes Physical Abuse: Denies Verbal Abuse: Denies Sexual Abuse: Denies Exploitation of patient/patient's resources: Denies Self-Neglect: Denies  Emotional Status Pt's affect, behavior adn adjustment status: Pt very pleasant and able to complete assessment interview without any difficulty.  She admits she has had a difficult time coping with her acute decline and now with current debility, however, is optimistic about longer term recovery.   Recent Psychosocial Issues: None Pyschiatric History: None Substance Abuse History: None  Patient / Family Perceptions, Expectations & Goals Pt/Family understanding of illness & functional limitations: Pt with good understanding of her medical course and current functional limitations/ need for CIR. Premorbid pt/family roles/activities: completely independent Anticipated changes in roles/activities/participation: little change anticipated if able to reach targeted mod ind goals. Pt/family expectations/goals: "I need to get my strength back!"  [employer]: None Premorbid Home Care/DME Agencies: None Transportation available at discharge: yes  Discharge Planning Living Arrangements: Spouse/significant other Support Systems: Spouse/significant other, Friends/neighbors Type of Residence: Private residence [employer] Resources: Teacher, adult education Resources: Employment Museum/gallery curator Screen Referred: No Living Expenses: Education officer, community Management: Patient Does the patient have any problems obtaining your medications?: No Home Management: pt and fiance Patient/Family Preliminary Plans: Pt plans to return home with fiance who can provide any needed  assistance. Social Work Anticipated Follow Up Needs: HH/OP Expected length of stay: 5-7 days  Clinical Impression Very pleasant woman  here for debility following acute medical decline with due to influenza A.  Making excellent progress and anticipate a very short LOS.  Plan to d/c home with fiance who can provide any needed assistance.  SW to follow for support and d/c planning needs.  Marialice Newkirk 09/13/2017, 8:45 AM

## 2017-09-15 NOTE — Discharge Summary (Signed)
[name], [name]           ACCOUNT NO.:  [id number]  MEDICAL RECORD NO.:  [id number]  LOCATION:                                 FACILITY:  PHYSICIAN:  Meredith Staggers, M.D.DATE OF BIRTH:  25-May-1966  DATE OF ADMISSION:  09/10/2017 DATE OF DISCHARGE:  09/15/2017                              DISCHARGE SUMMARY   DISCHARGE DIAGNOSES: 1. Debilitation related to acute respiratory distress syndrome,     related to influenza-A. 2. Subcutaneous Lovenox for deep vein thrombosis prophylaxis. 3. Tracheostomy - decannulation. 4. Dysphagia. 5. Hypertension. 6. Anemia due to chronic illness. 7. Elevated LFTs, felt to be reactive.  This is a 52 year old, right-handed female with history of obesity, lives with spouse, independent prior to admission.  Admitted on August 17, 2017, with acute hypoxic respiratory failure, due to influenza-A, superimposed on pneumonia.  She had been placed on antibiotic therapy, received Tamiflu, developed ARDS with septic shock, required intubation. She had difficulty with extubation attempts due to stridor, requiring tracheostomy on September 02, 2017, slowly downsized to a #4 cuffless. Diet advanced to mechanical soft, nectar thick liquids.  Subcutaneous Lovenox for DVT prophylaxis.  The patient was admitted for comprehensive rehab program.  PAST MEDICAL HISTORY:  See discharge diagnoses.  SOCIAL HISTORY:  The patient lives with husband, independent prior to admission.  FUNCTIONAL STATUS UPON ADMISSION TO REHAB SERVICES:  Minimal assist 60 feet rolling walker, moderate assist stand pivot transfers, min to mod assist activities daily living.  PHYSICAL EXAMINATION:  VITAL SIGNS:  Blood pressure 156/101, pulse 100, temperature 98, and respirations 20. GENERAL:  Alert female, in no acute distress. HEENT:  Tracheostomy tube in place.  EOMs intact. NECK:  Supple.  Nontender.  No JVD. CARDIAC:  Rate controlled. ABDOMEN:  Soft, nontender.  Good bowel  sounds. LUNGS:  Clear to auscultation without wheeze. EXTREMITIES:  4/5 upper extremity strength proximal to distal, lower extremities 3/5, hip flexors and knee extension 4/5 proximal to distal.  REHABILITATION HOSPITAL COURSE:  The patient was admitted to [facility].  Therapies initiated on a 3-hour daily basis consisting of physical therapy, occupational therapy, speech therapy, and rehabilitation nursing.  The following issues were addressed during the patient's rehabilitation stay.  Pertaining to Cassie [name] ARDS, VDRF, she completed a course of Tamiflu for influenza A.  She had been decannulated.  She would follow up with Pulmonary Services. Subcutaneous Lovenox for DVT prophylaxis, venous Doppler studies negative.  Oxygen saturations greater than 90%.  Diet advanced to regular, doing quite nicely, no signs of aspiration.  Blood pressures controlled with Norvasc as well as low-dose Lopressor.  Acute anemia due to critical illness, hemoglobin 10.8, no bleeding episodes.  The patient received weekly collaborative interdisciplinary team conferences to discuss estimated length of stay, family teaching, any barriers to discharge.  She ambulates throughout the rehab facility for increasing endurance, working with energy conservation techniques.  She could ambulate to the first floor gift shop.  She was able to go to the public toilet, restroom, modified independence.  Gather belongings for activities of daily living and homemaking.  The patient encouraged overall progress and plan discharged to home.  DISCHARGE MEDICATIONS:  Included: 1. Norvasc 10 mg p.o. daily.  2. Colace 100 mg p.o. b.i.d. 3. Lopressor 12.5 mg p.o. b.i.d. 4. Multivitamin daily.  DIET:  Her diet was regular.  FOLLOWUP:  She would follow up with Dr. Alger Simons at the Outpatient Rehab Service office as directed; Dr. Chase Caller, Pulmonary Services.  SPECIAL INSTRUCTIONS:  No  driving.     Lauraine Rinne, P.A.   ______________________________ Meredith Staggers, M.D.    DA/MEDQ  D:  09/15/2017  T:  09/15/2017  Job:  [id number]  cc:   Brand Males, MD Meredith Staggers, M.D.

## 2017-09-15 NOTE — Discharge Instructions (Signed)
Inpatient Rehab Discharge Instructions  Cassie [name] Discharge date and time: No discharge date for patient encounter.   Activities/Precautions/ Functional Status: Activity: activity as tolerated Diet: regular diet Wound Care: keep wound clean and dry Functional status:  ___ No restrictions     ___ Walk up steps independently ___ 24/7 supervision/assistance   ___ Walk up steps with assistance ___ Intermittent supervision/assistance  ___ Bathe/dress independently ___ Walk with walker     _x__ Bathe/dress with assistance ___ Walk Independently    ___ Shower independently ___ Walk with assistance    ___ Shower with assistance ___ No alcohol     ___ Return to work/school ________   COMMUNITY REFERRALS UPON DISCHARGE:    Medical Equipment/Items Ordered:  Tub bench                                                      Agency/Supplier:  [facility] [phone]    Special Instructions: No driving   My questions have been answered and I understand these instructions. I will adhere to these goals and the provided educational materials after my discharge from the hospital.  Patient/Caregiver Signature _______________________________ Date __________  Clinician Signature _______________________________________ Date __________  Please bring this form and your medication list with you to all your follow-up doctor's appointments.

## 2017-09-16 ENCOUNTER — Telehealth: Payer: Self-pay | Admitting: *Deleted

## 2017-09-16 NOTE — Telephone Encounter (Signed)
Transitional Care call-1st attempt 3:50 09/16/17 left vm with appt info and request to call office    Appointment Friday 09/23/17 @ 11:20 arrive by 11:00 to see Danella Sensing NP then [facility] 10/24/17 [place]

## 2017-09-19 ENCOUNTER — Telehealth: Payer: Self-pay | Admitting: Internal Medicine

## 2017-09-19 NOTE — Telephone Encounter (Signed)
Transitional Care call Transitional Care Call Completed, Appointment Confirmed, Address Confirmed, New Patient Packet Mailed  Patient name: Cassie [name] DOB: 04-26-1966 1. Are you/is patient experiencing any problems since coming home? No a. Are there any questions regarding any aspect of care? No 2. Are there any questions regarding medications administration/dosing? No a. Are meds being taken as prescribed? Yes b. "Patient should review meds with caller to confirm"  Medication List Reviewed 3. Have there been any falls? No 4. Has Home Health been to the house and/or have they contacted you? NA: Uninsured: See Education officer, museum Note. a. If not, have you tried to contact them? NA b. Can we help you contact them? NA 5. Are bowels and bladder emptying properly? Yes a. Are there any unexpected incontinence issues? No b. If applicable, is patient following bowel/bladder programs? NA 6. Any fevers, problems with breathing, unexpected pain? No 7. Are there any skin problems or new areas of breakdown? [name] states she has a scab on her buttocks, no drainage. Will assess on her scheduled appointment. 8. Has the patient/family member arranged specialty MD follow up (ie cardiology/neurology/renal/surgical/etc.)?  She will be calling Dr. Chase Caller office in the morning a. Can we help arrange? No 9. Does the patient need any other services or support that we can help arrange? NA 10. Are caregivers following through as expected in assisting the patient? Yes 11. Has the patient quit smoking, drinking alcohol, or using drugs as recommended? [name] denies smoking, drinking alcohol or use of illicit drugs.   Appointment date/time 09/23/2017 , arrival time 11:00 for 11:20 appointment with Elray Mcgregor at [facility]

## 2017-09-19 NOTE — Telephone Encounter (Signed)
Title: A Randomized, Double-Blind, Placebo-Controlled Dose Ranging Study Evaluating the Safety Pharmacokinetics and Clinical Benefit of FLU-IGIV in Hospitalized Patients with Serious Influenza A infection. IA-001 (ClinicalTrials.gov Identifier: [id number], Protocol No: IA-001, [facility] Protocol #[id number])  RESEARCH SUBJECT. This research study is sponsored by [employer]    ...................................................................................................;  S: CRC Ambridge Bing - PI meet to go over AE log and con meds for subject Cassie [name] [birth year] - subject IA 20-008. During this time I realized that on 09/01/17 patient was re-intubated for stridor and got con meds of decadron and racemic epic and after this failed reintubated with etomidate and fentanyl for stridor related acute respiratory failure  My original thinking was this reintubation was natural history of ARDS and failed extubation. However, in associating these con meds I then called treating MD of that day 09/01/17 Dr Lake Bells. He stated that a) his feeling that stridor likely related to subglottic suctioning of ET tube ; b) he and I agreed not related to IP; c) his impression was and still is that the stridor was Unexpected and likely might not have happened if not for subglottic suctioning (hypothesis); d) definitely stridor was what resulted in re-intubation and subsequent tracheostomy; e) he had called for end of ARDS few days  prior to stridor; f) his impression was that without stridor patient would have had shorter length of stay I.e. Because of stridor hospitalization ended up being more prolonged than expected and independent of ARDS  Therefore, in retrospect, on 09/19/2017 2:01 PM - I as investigator am realizing that the event of stridor of Sep 01, 2017 was SAE but unrelated to investigational product. The end time for this SAE was when patient was reintubated. Will report to sponsor < 24h of  this discovery       Dr. Brand Males, M.D., [facility].C.P Pulmonary and Critical Care Medicine Staff Physician, [place], [place], [address]  Pager: [phone], If no answer or between  15:00h - 7:00h: call [phone] Telephone: [phone]

## 2017-09-22 ENCOUNTER — Telehealth: Payer: Self-pay | Admitting: Internal Medicine

## 2017-09-22 NOTE — Telephone Encounter (Signed)
Title: A Randomized, Double-Blind, Placebo-Controlled Dose Ranging Study Evaluating the Safety Pharmacokinetics and Clinical Benefit of FLU-IGIV in Hospitalized Patients with Serious Influenza A infection. IA-001 (ClinicalTrials.gov Identifier: [id number], Protocol No: IA-001, [facility] Protocol #[id number])  RESEARCH SUBJECT. This research study is sponsored by [employer]   Protocol:  - amendment : #4 -> 16 Dec 2016   -  Administrative Change Memo - 03/JAN/2019  - Investigator Bronchure: 4.0 - 16 Dec 2016 - double checked to be correct  YES .f  I placed a call to Cassie [name] to schedule a return visit for her day 60 follow up assessment for the Flu study. No answer so voice mail left for patient to return call to [facility]x to schedule her return appointment.  Carin Primrose BS CRC

## 2017-09-23 ENCOUNTER — Encounter: Payer: Self-pay | Admitting: Registered Nurse

## 2017-09-23 ENCOUNTER — Encounter: Payer: Self-pay | Attending: Registered Nurse | Admitting: Registered Nurse

## 2017-09-23 VITALS — BP 132/88 | HR 92

## 2017-09-23 DIAGNOSIS — J111 Influenza due to unidentified influenza virus with other respiratory manifestations: Secondary | ICD-10-CM | POA: Insufficient documentation

## 2017-09-23 DIAGNOSIS — J80 Acute respiratory distress syndrome: Secondary | ICD-10-CM | POA: Insufficient documentation

## 2017-09-23 DIAGNOSIS — R531 Weakness: Secondary | ICD-10-CM | POA: Insufficient documentation

## 2017-09-23 DIAGNOSIS — Z930 Tracheostomy status: Secondary | ICD-10-CM | POA: Insufficient documentation

## 2017-09-23 DIAGNOSIS — R200 Anesthesia of skin: Secondary | ICD-10-CM | POA: Insufficient documentation

## 2017-09-23 DIAGNOSIS — Z841 Family history of disorders of kidney and ureter: Secondary | ICD-10-CM | POA: Insufficient documentation

## 2017-09-23 DIAGNOSIS — I10 Essential (primary) hypertension: Secondary | ICD-10-CM | POA: Insufficient documentation

## 2017-09-23 DIAGNOSIS — E039 Hypothyroidism, unspecified: Secondary | ICD-10-CM | POA: Insufficient documentation

## 2017-09-23 DIAGNOSIS — R5381 Other malaise: Secondary | ICD-10-CM | POA: Insufficient documentation

## 2017-09-23 NOTE — Progress Notes (Signed)
Subjective:    Patient ID: Cassie [name], female    DOB: 07-04-1966, 52 y.o.   MRN: [id number]  HPI: Ms. Cassie [name] is a 52 year old woman who is here for transitional visit in follow up of her debilitation related to acute respiratory distress syndrome, related to Influenza A. She was admitted with acute hypoxic respiratory failure due to Influenza A, developed ARDS with Septic Shock she required intubation. She had difficulty with extubation requiring tracheostomy on 09/02/2017, she was de cannulated. She was instructed to follow up with Pulmonology. She's not receiving home therapies due to Financial Hardship and lack of Insurance. She reports she is following HEP as tolerated, also states her endurance is improving. Denies any pain, rates her pain 0.   Also reports appetite is good without any difficulties.   Reinforced no driving at this time, she verbalizes understanding.  Pain Inventory Average Pain 0 Pain Right Now 0 My pain is NA  In the last 24 hours, has pain interfered with the following? General activity 0 Relation with others 0 Enjoyment of life 0 What TIME of day is your pain at its worst? NA Sleep (in general) Good  Pain is worse with: NA Pain improves with: NA Relief from Meds: NA  Mobility walk without assistance ability to climb steps?  no do you drive?  no  Function employed # of hrs/week 40  Neuro/Psych weakness numbness  Prior Studies Any changes since last visit?  no  Physicians involved in your care Any changes since last visit?  no   Family History  Problem Relation Age of Onset  . Kidney disease Father    Social History   Socioeconomic History  . Marital status: Single    Spouse name: None  . Number of children: None  . Years of education: None  . Highest education level: None  Social Needs  . Financial resource strain: None  . Food insecurity - worry: None  . Food insecurity - inability: None  . Transportation needs  - medical: None  . Transportation needs - non-medical: None  Occupational History  . None  Tobacco Use  . Smoking status: Never Smoker  . Smokeless tobacco: Never Used  Substance and Sexual Activity  . Alcohol use: No    Frequency: Never  . Drug use: No  . Sexual activity: Yes  Other Topics Concern  . None  Social History Narrative  . None   Past Surgical History:  Procedure Laterality Date  . DENTAL SURGERY     Past Medical History:  Diagnosis Date  . Hypothyroidism    LMP 08/20/2017   Opioid Risk Score:   Fall Risk Score:  `1  Depression screen PHQ 2/9  No flowsheet data found.   Review of Systems  HENT: Negative.   Eyes: Negative.   Respiratory: Negative.   Cardiovascular: Negative.   Gastrointestinal: Negative.   Endocrine: Negative.   Genitourinary: Negative.   Musculoskeletal: Negative.   Skin: Negative.   Allergic/Immunologic: Negative.   Neurological: Positive for weakness and numbness.  Hematological: Negative.   Psychiatric/Behavioral: Negative.   All other systems reviewed and are negative.      Objective:   Physical Exam  Constitutional: She is oriented to person, place, and time. She appears well-developed and well-nourished.  HENT:  Head: Normocephalic and atraumatic.  Neck: Normal range of motion. Neck supple.  Cardiovascular: Normal rate and regular rhythm.  Pulmonary/Chest: Effort normal and breath sounds normal.  Musculoskeletal:  Normal Muscle Bulk and  Muscle Testing Reveals: Upper Extremities: Full ROM and Muscle Strength 5/5 Lower Extremities: Full ROM and Muscle Strength 5/5 Arises from Table with ease Narrow Based Gait  Neurological: She is alert and oriented to person, place, and time.  Skin: Skin is warm and dry.  Psychiatric: She has a normal mood and affect.  Nursing note and vitals reviewed.         Assessment & Plan:  1. Debilitation related to Acute Respiratory Distress Syndrome, related to  Influenza: Continue HEP as Tolerated. Encouraged to F/U with Pulmonary. She verbalizes understanding.  2. HTN: Controlled: Continue current medication regimen.   30 minutes of face to face patient care time was spent during this visit. All questions were encouraged and answered.  F/U with Dr Naaman Plummer in 4-6 weeks

## 2017-10-03 ENCOUNTER — Inpatient Hospital Stay (INDEPENDENT_AMBULATORY_CARE_PROVIDER_SITE_OTHER): Payer: Self-pay | Admitting: Physician Assistant

## 2017-10-03 ENCOUNTER — Telehealth: Payer: Self-pay | Admitting: *Deleted

## 2017-10-03 NOTE — Telephone Encounter (Signed)
She may drive

## 2017-10-03 NOTE — Telephone Encounter (Signed)
Cassie [name] has called and is asking permission to be able to drive.  Please advise.

## 2017-10-04 NOTE — Telephone Encounter (Signed)
[place] notified

## 2017-10-17 NOTE — Progress Notes (Signed)
Title: A Randomized, Double-Blind, Placebo-Controlled Dose Ranging Study Evaluating the Safety Pharmacokinetics and Clinical Benefit of FLU-IGIV in Hospitalized Patients with Serious Influenza A infection. IA-001 (ClinicalTrials.gov Identifier: [id number], Protocol No: IA-001, [facility] Protocol #[id number])  RESEARCH SUBJECT. This research study is sponsored by [employer]   Protocol: Version 5.0 dated 18 Aug 2017 - amendment : 5.0  -  Administrative Change Memo - NA  - Investigator Bronchure: 3.0 dated 18 Aug 2017 - double checked to be correct: Yes Scientist, physiological / Electrical engineer note : This visit for Subject Cassie [name] with DOB: 1966-02-24 on 10/17/2017 for the above protocol is Visit/Encounter # Visit Day 90  and is for purpose of flu study assessment. The consent for this encounter is under Protocol Version 5.0 and  is currently IRB approved.  In this visit 10/17/2017 the subject was assessed for visit day 60. Subject is able to resume her normal activities as before she became ill with flu. Patient did not report any AEs or change in her conmeds. Patient reported that during nights once in a while she does cough, unassociated with fever or shortness of breath. Please was told that this is the last visit for flu research study and continue to see a physician for standard of care.  Signed by  [place], [place] 1:42 PM 10/17/2017

## 2017-10-18 ENCOUNTER — Telehealth: Payer: Self-pay | Admitting: Internal Medicine

## 2017-10-18 NOTE — Telephone Encounter (Signed)
Cassie [name]  She Mickel Duhamel had flu  ARDS. She had standard of care question to research team. Can you please call and find out what they were and if she needs to come for standard of care level 3 established visit she can first avail  Thanks  Dr. Brand Males, M.D., [facility].C.P Pulmonary and Critical Care Medicine Staff Physician, [place] Director - Interstitial Lung Disease  Program  Pulmonary [place] at [place], [place], [address]  Pager: [phone], If no answer or between  15:00h - 7:00h: call [phone] Telephone: [phone]

## 2017-10-18 NOTE — Telephone Encounter (Signed)
Called patient, unable to reach left message to give us a call back. 

## 2017-10-24 ENCOUNTER — Encounter: Payer: Self-pay | Attending: Physical Medicine & Rehabilitation | Admitting: Physical Medicine & Rehabilitation

## 2017-10-24 ENCOUNTER — Encounter: Payer: Self-pay | Admitting: Physical Medicine & Rehabilitation

## 2017-10-24 VITALS — BP 179/122 | HR 92 | Ht 64.0 in | Wt 205.0 lb

## 2017-10-24 DIAGNOSIS — Z841 Family history of disorders of kidney and ureter: Secondary | ICD-10-CM | POA: Insufficient documentation

## 2017-10-24 DIAGNOSIS — J111 Influenza due to unidentified influenza virus with other respiratory manifestations: Secondary | ICD-10-CM | POA: Insufficient documentation

## 2017-10-24 DIAGNOSIS — R5381 Other malaise: Secondary | ICD-10-CM | POA: Insufficient documentation

## 2017-10-24 DIAGNOSIS — R531 Weakness: Secondary | ICD-10-CM | POA: Insufficient documentation

## 2017-10-24 DIAGNOSIS — E039 Hypothyroidism, unspecified: Secondary | ICD-10-CM | POA: Insufficient documentation

## 2017-10-24 DIAGNOSIS — J80 Acute respiratory distress syndrome: Secondary | ICD-10-CM | POA: Insufficient documentation

## 2017-10-24 DIAGNOSIS — I10 Essential (primary) hypertension: Secondary | ICD-10-CM | POA: Insufficient documentation

## 2017-10-24 DIAGNOSIS — R1312 Dysphagia, oropharyngeal phase: Secondary | ICD-10-CM

## 2017-10-24 DIAGNOSIS — Z930 Tracheostomy status: Secondary | ICD-10-CM | POA: Insufficient documentation

## 2017-10-24 DIAGNOSIS — R200 Anesthesia of skin: Secondary | ICD-10-CM | POA: Insufficient documentation

## 2017-10-24 MED ORDER — AMLODIPINE BESYLATE 10 MG PO TABS: 10.0000 mg | ORAL_TABLET | Freq: Every day | ORAL | 4 refills | Status: DC

## 2017-10-24 NOTE — Telephone Encounter (Signed)
Called and spoke with pt who wanted to know after the pna she had, what condition are her lungs in.  Pt wants to know if there could possibly be any scarring on the lungs and how weak are her lungs not based on the fact that she had pna.  MR, please advise on this for pt. Thanks!

## 2017-10-24 NOTE — Patient Instructions (Signed)
YOU NEED TO CONTROL YOUR BLOOD PRESSURE!!

## 2017-10-24 NOTE — Progress Notes (Signed)
Subjective:    Patient ID: Cassie [name], female    DOB: June 07, 1966, 52 y.o.   MRN: [id number]  HPI   Cassie [name] is here in follow-up of her inpatient rehab stay where she was for debility after a severe attack of influenza A and subsequent respiratory failure.  She has resumed driving and is working "a couple hours a day".  She probably works a bit more than that but is trying to watch herself and know her limits.  She denies much pain at this point.  She reports some discomfort on her "tailbone" and she saw her chiropractor and he did some "laser therapy" to the area which seems to have helped.  She still has some tingling in the area as well as some intermittent tingling in the right hand and left leg.  She reports a tickling in the back of her throat still but there is denies any problems with breathing.  She is sleeping well.  She stopped taking her blood pressure medications due to fear of side effects.  Cassie [name] is here.  Pain Inventory Average Pain 1 Pain Right Now 1 My pain is dull  In the last 24 hours, has pain interfered with the following? General activity 0 Relation with others 0 Enjoyment of life 0 What TIME of day is your pain at its worst? na Sleep (in general) Fair  Pain is worse with: sitting Pain improves with: na Relief from Meds: na  Mobility ability to climb steps?  yes do you drive?  yes  Function employed # of hrs/week 15  Neuro/Psych weakness numbness  Prior Studies Any changes since last visit?  no  Physicians involved in your care Any changes since last visit?  no   Family History  Problem Relation Age of Onset  . Kidney disease Father    Social History   Socioeconomic History  . Marital status: Single    Spouse name: Not on file  . Number of children: Not on file  . Years of education: Not on file  . Highest education level: Not on file  Occupational History  . Not on file  Social Needs  . Financial resource strain:  Not on file  . Food insecurity:    Worry: Not on file    Inability: Not on file  . Transportation needs:    Medical: Not on file    Non-medical: Not on file  Tobacco Use  . Smoking status: Never Smoker  . Smokeless tobacco: Never Used  Substance and Sexual Activity  . Alcohol use: No    Frequency: Never  . Drug use: No  . Sexual activity: Yes  Lifestyle  . Physical activity:    Days per week: Not on file    Minutes per session: Not on file  . Stress: Not on file  Relationships  . Social connections:    Talks on phone: Not on file    Gets together: Not on file    Attends religious service: Not on file    Active member of club or organization: Not on file    Attends meetings of clubs or organizations: Not on file    Relationship status: Not on file  Other Topics Concern  . Not on file  Social History Narrative  . Not on file   Past Surgical History:  Procedure Laterality Date  . DENTAL SURGERY     Past Medical History:  Diagnosis Date  . Hypothyroidism    BP (!) 179/122  Pulse 92   Ht 5\' 4"  (1.626 m) Comment: states  Wt 205 lb (93 kg) Comment: states  SpO2 95%   BMI 35.19 kg/m   Opioid Risk Score:   Fall Risk Score:  `1  Depression screen PHQ 2/9  Depression screen PHQ 2/9 09/23/2017  Decreased Interest 0  Down, Depressed, Hopeless 0  PHQ - 2 Score 0     Review of Systems  Constitutional: Negative.   HENT: Negative.   Eyes: Negative.   Respiratory: Negative.   Cardiovascular: Negative.   Gastrointestinal: Negative.   Endocrine: Negative.   Genitourinary: Negative.   Musculoskeletal: Negative.   Skin: Negative.   Allergic/Immunologic: Negative.   Neurological: Positive for weakness and numbness.  Hematological: Negative.   Psychiatric/Behavioral: Negative.   All other systems reviewed and are negative.      Objective:   Physical Exam  General: No acute distress HEENT: EOMI, oral membranes moist Cards: reg rate  Chest: normal  effort Abdomen: Soft, NT, ND Skin: dry, intact Extremities: no edema  Musculoskeletal: She exhibits noedema,tendernessor deformity.  Psychiatric: She has anormal mood and affect. Herbehavior is normal.Thought contentnormal.  Skin. Warm and dry.    Sacral/gluteal wounds healed.  She is mildly tender with palpation near the sacral coccygeal region Musculoskeletal:  No edema Neurological: She isalert. Motor: Strength is 5 out of 5 with really normal sensory exam in all 4.  Balance is normal.  Cognitively she is very appropriate Psych: Pleasant and appropriate        Assessment & Plan:  Medical Problem List and Plan: 1.Debilitysecondary to ARDS/VDRF relatedto influenza A -Patient back close to baseline.  Still needs to work on stamina and physical fitness 2. S/P tracheostomy 09/02/2017.     -I suspect some of the tingling sensation she is experiencing is related to the trach.  This should gradually improve with time.  Her voice is strong.  She is swallowing and eating without issues.  3.Hypertension.  This is poorly controlled at present.  -Resumed Norvasc 10 mg daily.            -If there is no improvement will also need to resume Lopressor 12.5 mg twice daily.    -She needs to establish care with a primary care physician     Fifteen minutes of face to face patient care time were spent during this visit. All questions were encouraged and answered.  Follo wup with me prn.

## 2017-10-26 NOTE — Telephone Encounter (Signed)
Tell her to come in for visit   Dr. Brand Males, M.D., [facility].C.P Pulmonary and Critical Care Medicine Staff Physician, [place] Director - Interstitial Lung Disease  Program  Pulmonary [place] at [place], [place], [address]  Pager: [phone], If no answer or between  15:00h - 7:00h: call [phone] Telephone: [phone]

## 2017-10-27 NOTE — Telephone Encounter (Signed)
MR, please advise if you want her to come in for a visit with you or with an APP?

## 2017-10-28 NOTE — Telephone Encounter (Signed)
I was able to talk to patient and set up an appointment for Nov 28 2017 at 1145 am.  Going to leave in [place] box until hear from Kathlee Nations due to patient has no insurance and wants to know cost for visit. Once found out please call patient back .  Thank you

## 2017-10-28 NOTE — Telephone Encounter (Signed)
Come and see me; first available is fine    Dr. Brand Males, M.D., [facility].C.P Pulmonary and Critical Care Medicine Staff Physician, [place] Director - Interstitial Lung Disease  Program  Pulmonary [place] at [place], [place], [address]  Pager: [phone], If no answer or between  15:00h - 7:00h: call [phone] Telephone: [phone]

## 2017-10-31 NOTE — Telephone Encounter (Signed)
Was able to talk to Cassie [name] and the patient, information was given to Patient.she is able to keep her appointment for 11/28/2017  No further needs or questions at this time.

## 2017-11-28 ENCOUNTER — Ambulatory Visit (INDEPENDENT_AMBULATORY_CARE_PROVIDER_SITE_OTHER): Payer: Self-pay | Admitting: Internal Medicine

## 2017-11-28 ENCOUNTER — Encounter: Payer: Self-pay | Admitting: Internal Medicine

## 2017-11-28 ENCOUNTER — Ambulatory Visit (INDEPENDENT_AMBULATORY_CARE_PROVIDER_SITE_OTHER)
Admission: RE | Admit: 2017-11-28 | Discharge: 2017-11-28 | Disposition: A | Discharge status: Home / Self Care | Payer: Self-pay | Source: Ambulatory Visit | Attending: Internal Medicine | Admitting: Internal Medicine

## 2017-11-28 ENCOUNTER — Other Ambulatory Visit (INDEPENDENT_AMBULATORY_CARE_PROVIDER_SITE_OTHER): Payer: Self-pay

## 2017-11-28 VITALS — BP 126/78 | HR 92 | Ht 64.0 in | Wt 214.6 lb

## 2017-11-28 DIAGNOSIS — Z930 Tracheostomy status: Secondary | ICD-10-CM

## 2017-11-28 DIAGNOSIS — R059 Cough, unspecified: Secondary | ICD-10-CM

## 2017-11-28 DIAGNOSIS — R05 Cough: Secondary | ICD-10-CM

## 2017-11-28 DIAGNOSIS — Z8709 Personal history of other diseases of the respiratory system: Secondary | ICD-10-CM

## 2017-11-28 DIAGNOSIS — R740 Nonspecific elevation of levels of transaminase and lactic acid dehydrogenase [LDH]: Secondary | ICD-10-CM

## 2017-11-28 DIAGNOSIS — R7401 Elevation of levels of liver transaminase levels: Secondary | ICD-10-CM

## 2017-11-28 DIAGNOSIS — R0982 Postnasal drip: Secondary | ICD-10-CM

## 2017-11-28 LAB — CBC WITH DIFFERENTIAL/PLATELET
BASOS ABS: 0.1 10*3/uL (ref 0.0–0.1)
Basophils Relative: 0.5 % (ref 0.0–3.0)
EOS ABS: 0.3 10*3/uL (ref 0.0–0.7)
Eosinophils Relative: 2.6 % (ref 0.0–5.0)
HEMATOCRIT: 37.5 % (ref 36.0–46.0)
HEMOGLOBIN: 12.4 g/dL (ref 12.0–15.0)
LYMPHS PCT: 19.9 % (ref 12.0–46.0)
Lymphs Abs: 2.2 10*3/uL (ref 0.7–4.0)
MCHC: 33.1 g/dL (ref 30.0–36.0)
MCV: 75.2 fl — ABNORMAL LOW (ref 78.0–100.0)
MONO ABS: 0.7 10*3/uL (ref 0.1–1.0)
Monocytes Relative: 6.3 % (ref 3.0–12.0)
Neutro Abs: 7.7 10*3/uL (ref 1.4–7.7)
Neutrophils Relative %: 70.7 % (ref 43.0–77.0)
Platelets: 325 10*3/uL (ref 150.0–400.0)
RBC: 4.99 Mil/uL (ref 3.87–5.11)
RDW: 16.9 % — AB (ref 11.5–15.5)
WBC: 10.9 10*3/uL — AB (ref 4.0–10.5)

## 2017-11-28 LAB — BASIC METABOLIC PANEL
BUN: 12 mg/dL (ref 6–23)
CALCIUM: 10.1 mg/dL (ref 8.4–10.5)
CHLORIDE: 104 meq/L (ref 96–112)
CO2: 31 mEq/L (ref 19–32)
CREATININE: 0.63 mg/dL (ref 0.40–1.20)
GFR: 105.26 mL/min (ref >60.00)
Glucose, Bld: 91 mg/dL (ref 70–99)
Potassium: 3.9 mEq/L (ref 3.5–5.1)
Sodium: 141 mEq/L (ref 135–145)

## 2017-11-28 LAB — HEPATIC FUNCTION PANEL
ALBUMIN: 4.2 g/dL (ref 3.5–5.2)
ALK PHOS: 77 U/L (ref 39–117)
ALT: 29 U/L (ref 0–35)
AST: 18 U/L (ref 0–37)
BILIRUBIN DIRECT: 0 mg/dL (ref 0.0–0.3)
TOTAL PROTEIN: 7.3 g/dL (ref 6.0–8.3)
Total Bilirubin: 0.3 mg/dL (ref 0.2–1.2)

## 2017-11-28 NOTE — Patient Instructions (Addendum)
ICD-10-CM   1. 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 Transaminitis R74.0   4. Post-nasal drip R09.82   5. Cough R05     Glad you are doing better Check CXR, cbc, bmet, lft 11/28/2017 - will call with results Take generic fluticasone inhaler 2 squirts each nostril daily and if cough does not resolve call us Monitor your improving hoarseness and voice issues - if not better over months or a year call us Always get annual flu shot  Followup  - will call with results and if results are ok, as needed followup

## 2017-11-28 NOTE — Progress Notes (Signed)
Subjective:     Patient ID: Cassie [name], female   DOB: 11/01/1965, 52 y.o.   MRN: [id number]  HPI   OV 11/28/2017  Chief Complaint  Patient presents with  . Follow-up    F/u for pt per phone note on 3/26.  Pt in hosp 1/23-2/16 with flu and pna. States she was on a vent x2 weeks and due to the hosp. stay and dx, she is wanting to know where she stands now with her health. Pt states she coughs every day with clear mucus. Denies any sob or cp.   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  Then she was in rehab.  Currently she is at home and she is steadily improving main issues right now her nocturnal cough associated with postnasal drip.   But she is working out and doing a lot of activities of daily living.  She has hoarseness of voice but this is not made worse with exercise but more so when she is unable to sing high-pitched.  There is also improving.  She has a chiropractor giving her external laser treatment to make the tracheostomy scar go away.  The hoarseness of voice is mild.   There are no other issues.  She is making good urine.  Review of the labs shows that at the time of discharge she did have some transaminitis.  There has been no further follow-up about that.  She has questions about annual influenza vaccine and general precautions.  She does not want ENT referral at this point. She is no longer deconditined . FAtigue is resolved. BP is normal but she is on norvasc   Simple office walk 185 feet x  3 laps goal with forehead probe 11/28/2017   O2 used Room air  Number laps completed 3  Comments about pace Normal steady pace  Resting Pulse Ox/HR 97% and 89/min  Final Pulse Ox/HR 95% and 106/min  Desaturated </= 88% no  Desaturated <= 3% points no  Got Tachycardic >/= 90/min Yes but is physiologic  Symptoms at end of test none  Miscellaneous comments normal       has a past medical history of Hypothyroidism.   reports that she has never smoked. She has never used smokeless tobacco.  Past Surgical History:  Procedure Laterality Date  . DENTAL SURGERY      Allergies  Allergen Reactions  . Sulfa Antibiotics      There is no immunization history on file for this patient.  Family History  Problem Relation Age of Onset  . Kidney disease Father      Current Outpatient Medications:  .  amLODipine (NORVASC) 10 MG tablet, Take 1 tablet (10 mg total) by mouth daily., Disp: 30 tablet, Rfl: 4 .  ARMOUR THYROID PO, Take by mouth., Disp: , Rfl:  .  Kava, Piper methysticum, (KAVA KAVA PO), Take by mouth., Disp: , Rfl:  .  metFORMIN (GLUCOPHAGE) 500 MG tablet, Take by mouth 2 (two) times daily with a meal., Disp: , Rfl:  .  NON FORMULARY, , Disp: , Rfl:  .  NON FORMULARY, , Disp: , Rfl:  .  progesterone (PROMETRIUM) 100 MG capsule, Take 100 mg by mouth daily., Disp: , Rfl:  .  Turmeric POWD, by Does not apply route., Disp: , Rfl:    Review of Systems     Objective:   Physical Exam  Constitutional: She is oriented to person, place, and time. She appears well-developed and well-nourished. No distress.  obese  HENT:  Head: Normocephalic and atraumatic.  Right Ear: External ear normal.  Left Ear: External ear normal.  Mouth/Throat: Oropharynx is clear and moist. No oropharyngeal exudate.  Tracheostomy scar +  Eyes: Pupils are equal, round, and reactive to light. Conjunctivae and EOM are normal. Right eye exhibits no discharge. Left eye exhibits no discharge. No scleral icterus.  Neck: Normal range of motion. Neck supple. No JVD present. No tracheal deviation present. No thyromegaly present.  Cardiovascular: Normal rate, regular rhythm, normal heart sounds and intact distal pulses. Exam reveals no gallop and no friction rub.  No murmur heard. Pulmonary/Chest: Effort normal and breath sounds normal. No respiratory distress. She has no  wheezes. She has no rales. She exhibits no tenderness.  Abdominal: Soft. Bowel sounds are normal. She exhibits no distension and no mass. There is no tenderness. There is no rebound and no guarding.  Musculoskeletal: Normal range of motion. She exhibits no edema or tenderness.  Lymphadenopathy:    She has no cervical adenopathy.  Neurological: She is alert and oriented to person, place, and time. She has normal reflexes. No cranial nerve deficit. She exhibits normal muscle tone. Coordination normal.  Skin: Skin is warm and dry. No rash noted. She is not diaphoretic. No erythema. No pallor.  Psychiatric: She has a normal mood and affect. Her behavior is normal. Judgment and thought content normal.  Vitals reviewed.  Today's Vitals   11/28/17 1148  BP: 126/78  Pulse: 92  SpO2: 97%  Weight: 214 lb 9.6 oz (97.3 kg)  Height: 5\' 4"  (1.626 m)   Estimated body mass index is 36.84 kg/m as calculated from the following:   Height as of this encounter: 5\' 4"  (1.626 m).   Weight as of this encounter: 214 lb 9.6 oz (97.3 kg).      Assessment:       ICD-10-CM   1. 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 Transaminitis R74.0   4. Post-nasal drip R09.82   5. Cough R05        Plan:      Glad you are doing better Check CXR, cbc, bmet, lft 11/28/2017 - will call with results Take generic fluticasone inhaler 2 squirts each nostril daily and if cough does not resolve call us Monitor your improving hoarseness and voice issues - if not better over months or a year call us Always get annual flu shot  Followup  - will call with results and if results are ok, as needed followup     Dr. Brand Males, M.D., [facility].C.P Pulmonary and Critical Care Medicine Staff Physician, [place] Director - Interstitial Lung Disease  Program  Pulmonary [place] at [place], [place], [address]  Pager: [phone], If no  answer or between  15:00h - 7:00h: call [phone] Telephone: [phone]

## 2017-11-29 ENCOUNTER — Telehealth: Payer: Self-pay | Admitting: Internal Medicine

## 2017-11-29 NOTE — Telephone Encounter (Signed)
Patient made aware of Lab and CXR results maybe by MR. Nothing further need at this time.

## 2017-11-29 NOTE — Telephone Encounter (Signed)
Let Cassie [name] know that blood labs and cxr are normal   PULMONARY No results for input(s): PHART, PCO2ART, PO2ART, HCO3, TCO2, O2SAT in the last 168 hours.  Invalid input(s): PCO2, PO2  CBC Recent Labs  Lab 11/28/17 1249  HGB 12.4  HCT 37.5  WBC 10.9*  PLT 325.0    COAGULATION No results for input(s): INR in the last 168 hours.  CARDIAC  No results for input(s): TROPONINI in the last 168 hours. No results for input(s): PROBNP in the last 168 hours.   CHEMISTRY Recent Labs  Lab 11/28/17 1249  NA 141  K 3.9  CL 104  CO2 31  GLUCOSE 91  BUN 12  CREATININE 0.63  CALCIUM 10.1   Estimated Creatinine Clearance: 93.1 mL/min (by C-G formula based on SCr of 0.63 mg/dL).   LIVER Recent Labs  Lab 11/28/17 1249  AST 18  ALT 29  ALKPHOS 77  BILITOT 0.3  PROT 7.3  ALBUMIN 4.2     INFECTIOUS No results for input(s): LATICACIDVEN, PROCALCITON in the last 168 hours.   ENDOCRINE CBG (last 3)  No results for input(s): GLUCAP in the last 72 hours.       IMAGING x48h  - image(s) personally visualized  -   highlighted in bold Dg Chest 2 View  Result Date: 11/28/2017 CLINICAL DATA:  ARDS survivor. EXAM: CHEST - 2 VIEW COMPARISON:  September 05, 2017 FINDINGS: Stable cardiomegaly. The hila and mediastinum are normal. No pneumothorax. No nodules or masses. No focal infiltrates. IMPRESSION: No active cardiopulmonary disease. Electronically Signed   By: Dorise Bullion III M.D   On: 11/28/2017 17:13

## 2018-01-27 ENCOUNTER — Ambulatory Visit (INDEPENDENT_AMBULATORY_CARE_PROVIDER_SITE_OTHER): Payer: Self-pay | Admitting: Physician Assistant

## 2018-02-17 ENCOUNTER — Encounter: Payer: Self-pay | Admitting: Obstetrics and Gynecology

## 2018-02-17 ENCOUNTER — Telehealth: Payer: Self-pay | Admitting: Obstetrics and Gynecology

## 2018-02-17 ENCOUNTER — Ambulatory Visit: Payer: Self-pay | Admitting: Obstetrics and Gynecology

## 2018-02-17 VITALS — BP 154/88 | HR 81 | Ht 64.0 in | Wt 225.0 lb

## 2018-02-17 DIAGNOSIS — Z3141 Encounter for fertility testing: Secondary | ICD-10-CM

## 2018-02-17 DIAGNOSIS — E289 Ovarian dysfunction, unspecified: Secondary | ICD-10-CM

## 2018-02-17 NOTE — Telephone Encounter (Signed)
-----   Message from Will Bonnet, MD sent at 02/17/2018 11:53 AM EDT ----- Regarding: Schedule HSG Please schedule an HSG for either Cassie [name], or [name] (if she does them). This should scheduled for next week. Thank you!

## 2018-02-17 NOTE — Progress Notes (Signed)
Obstetrics & Gynecology Office Visit   Chief Complaint: Wants fertility monitoring  History of Present Illness: 52 y.o. G47P0010 female who presents to establish care for fertility monitoring.  She is going to [facility] in [place].  They are in the beginning stages of starting care with [facility]. They went up to [place] on 01/23/18.  She requests help with the workup performed by [facility].  She was told that she needs a saline infusion sonohysterogram, HSG, and semen analysis.  She states that she had a lot of blood drawn.  She was having a menstruation every 58 days.  She is now having a menses every 28 days.  Her LMP started on 02/16/18.  She has been pregnant once in her life and had a miscarriage.  She had the flu during that pregnancy.   She has no complaints today and would simply like to establish care locally in order to have the labs and other testing performed so that she can achieve pregnancy through [place].     Past Medical History:  Diagnosis Date  . Hypertension   . Hypothyroidism   . Metabolic syndrome    Past Surgical History:  Procedure Laterality Date  . DENTAL SURGERY     Gynecologic History: Patient's last menstrual period was 02/16/2018 (exact date).  Obstetric History: G1P0010, 2013  Family History  Problem Relation Age of Onset  . Kidney disease Father   . Diabetes Father     Social History   Socioeconomic History  . Marital status: Single    Spouse name: Not on file  . Number of children: Not on file  . Years of education: Not on file  . Highest education level: Not on file  Occupational History  . Not on file  Social Needs  . Financial resource strain: Not on file  . Food insecurity:    Worry: Not on file    Inability: Not on file  . Transportation needs:    Medical: Not on file    Non-medical: Not on file  Tobacco Use  . Smoking status: Never Smoker  . Smokeless tobacco: Never Used  Substance and Sexual Activity  . Alcohol use: No    Frequency: Never  .  Drug use: No  . Sexual activity: Yes  Lifestyle  . Physical activity:    Days per week: Not on file    Minutes per session: Not on file  . Stress: Not on file  Relationships  . Social connections:    Talks on phone: Not on file    Gets together: Not on file    Attends religious service: Not on file    Active member of club or organization: Not on file    Attends meetings of clubs or organizations: Not on file    Relationship status: Not on file  . Intimate partner violence:    Fear of current or ex partner: Not on file    Emotionally abused: Not on file    Physically abused: Not on file    Forced sexual activity: Not on file  Other Topics Concern  . Not on file  Social History Narrative  . Not on file    Allergies  Allergen Reactions  . Sulfa Antibiotics     Prior to Admission medications   Medication Sig Start Date End Date Taking? Authorizing Provider  amLODipine (NORVASC) 10 MG tablet Take 1 tablet (10 mg total) by mouth daily. 10/24/17  Yes Meredith Staggers, MD  ARMOUR THYROID PO  Take by mouth.   Yes [provider]  Kava, Piper methysticum, (KAVA KAVA PO) Take by mouth.   Yes [provider]  metFORMIN (GLUCOPHAGE) 500 MG tablet Take by mouth 2 (two) times daily with a meal.   Yes [provider]  NON FORMULARY    Yes [provider]  NON FORMULARY    Yes [provider]  progesterone (PROMETRIUM) 100 MG capsule Take 100 mg by mouth daily.   Yes [provider]  Turmeric POWD by Does not apply route.    [provider]    Review of Systems  Constitutional: Negative.   HENT: Negative.   Eyes: Negative.   Respiratory: Positive for cough (residual from pneumonia, recent hospitalization). Negative for hemoptysis, sputum production, shortness of breath and wheezing.   Cardiovascular: Negative.   Gastrointestinal: Negative.   Genitourinary: Negative.   Musculoskeletal: Negative.   Skin: Negative.     Neurological: Negative.   Psychiatric/Behavioral: Negative.      Physical Exam BP (!) 154/88 (BP Location: Left Arm, Patient Position: Sitting, Cuff Size: Large)   Pulse 81   Ht 5\' 4"  (1.626 m)   Wt 225 lb (102.1 kg)   LMP 02/16/2018 (Exact Date)   SpO2 97%   BMI 38.62 kg/m  Patient's last menstrual period was 02/16/2018 (exact date). Physical Exam  Constitutional: She is oriented to person, place, and time. She appears well-developed and well-nourished. No distress.  HENT:  Head: Normocephalic and atraumatic.  Eyes: Conjunctivae are normal. No scleral icterus.  Pulmonary/Chest: No respiratory distress.  Musculoskeletal: Normal range of motion. She exhibits no edema.  Neurological: She is alert and oriented to person, place, and time. No cranial nerve deficit.  Skin: Skin is warm and dry. No erythema.  Psychiatric: She has a normal mood and affect. Her behavior is normal. Judgment normal.    Assessment: 52 y.o. G1P0010 female here for  1. Fertility testing   2. Ovarian dysfunction      Plan: Problem List Items Addressed This Visit    None    Visit Diagnoses    Fertility testing    -  Primary   Relevant Orders   Antibody screen   Comprehensive metabolic panel   Anti mullerian hormone   Rubella screen   TSH   Prolactin   Testosterone   VITAMIN D 25 Hydroxy (Vit-D Deficiency, Fractures)   HIV antibody   RPR Qual   Hepatitis B Surface AntiGEN   Hepatitis B Core Antibody, IgM   Hepatitis C antibody   CBC with Differential/Platelet   [place] Sonohysterogram   DG Hysterogram (HSG)   ABO/Rh   Ovarian dysfunction       Relevant Orders   Antibody screen   Comprehensive metabolic panel   Anti mullerian hormone   Rubella screen   TSH   Prolactin   Testosterone   VITAMIN D 25 Hydroxy (Vit-D Deficiency, Fractures)   HIV antibody   RPR Qual   Hepatitis B Surface AntiGEN   Hepatitis B Core Antibody, IgM   Hepatitis C antibody   CBC with Differential/Platelet    Estradiol   Progesterone   LH   Follicle stimulating hormone   Beta hCG quant (ref lab)   [place] Sonohysterogram   DG Hysterogram (HSG)   ABO/Rh     Baseline labs ordered per request of REI. Also, per REI request, will order HSG, and SIS, as well as semen analysis on her partner. Discussed that she should be taking PNV  and maximizing her health in every way.  Should she achieve pregnancy, she would be considered quite high risk and I would certainly recommend MFM consultation, if not co-management of any of her comorbid conditions. Briefly discussed pregnancy given her hospitalization earlier this year.    30 minutes spent in face to face discussion with > 50% spent in counseling,management, and coordination of care of her fertility testing and ovarian dysfunction.   Prentice Docker, MD 02/17/2018 12:38 PM

## 2018-02-17 NOTE — Telephone Encounter (Signed)
Lmtrc

## 2018-02-21 ENCOUNTER — Ambulatory Visit
Admission: RE | Admit: 2018-02-21 | Discharge: 2018-02-21 | Disposition: A | Discharge status: Home / Self Care | Payer: Self-pay | Source: Ambulatory Visit | Attending: Obstetrics and Gynecology | Admitting: Obstetrics and Gynecology

## 2018-02-21 DIAGNOSIS — E289 Ovarian dysfunction, unspecified: Secondary | ICD-10-CM | POA: Insufficient documentation

## 2018-02-21 DIAGNOSIS — Z3141 Encounter for fertility testing: Secondary | ICD-10-CM | POA: Insufficient documentation

## 2018-02-21 MED ORDER — IOPAMIDOL (ISOVUE-370) INJECTION 76%
50.0000 mL | Freq: Once | INTRAVENOUS | Status: AC | PRN
  Administered 2018-02-21: 13 mL

## 2018-02-21 NOTE — Procedures (Signed)
Consents signed and reviewed with patient.  Indication for procedure confirmed, Patient's last menstrual period was 02/16/2018 (exact date). currently cycle day 6.  No allergies to contrast or iodine was confirmed.  The catheter was primed with [id number] to eliminate air bubbles.  A sterile speculum was placed in the patient's vagina to allow visualization of the cervix.  The cervix was prepped with betadine solution.   The HSG was advanced through the external cervical os into uterine cavity, the catheter balloon was then inflated. Approximately 14mL dye injected under fluoroscopic.  Fallopian tube were patent bilateral with spill of contrast.  Uterine contour was noted to be arcuate.  Discussed that this may represent a Mullerian abnormality but could also represent a larger fundal fibroid.  Catheter removed.  Patient tolerated procedure well.   Malachy Mood, MD, [facility], [facility] 02/21/2018, 1:41 PM

## 2018-02-22 ENCOUNTER — Telehealth: Payer: Self-pay | Admitting: Obstetrics and Gynecology

## 2018-02-22 LAB — COMPREHENSIVE METABOLIC PANEL
ALK PHOS: 93 IU/L (ref 39–117)
ALT: 20 IU/L (ref 0–32)
AST: 16 IU/L (ref 0–40)
Albumin/Globulin Ratio: 1.8 (ref 1.2–2.2)
Albumin: 4.3 g/dL (ref 3.5–5.5)
BILIRUBIN TOTAL: 0.4 mg/dL (ref 0.0–1.2)
BUN / CREAT RATIO: 17 (ref 9–23)
BUN: 11 mg/dL (ref 6–24)
CO2: 23 mmol/L (ref 20–29)
Calcium: 9.6 mg/dL (ref 8.7–10.2)
Chloride: 102 mmol/L (ref 96–106)
Creatinine, Ser: 0.64 mg/dL (ref 0.57–1.00)
GFR calc Af Amer: 119 mL/min/{1.73_m2} (ref >59)
GFR calc non Af Amer: 103 mL/min/{1.73_m2} (ref >59)
GLUCOSE: 103 mg/dL — AB (ref 65–99)
Globulin, Total: 2.4 g/dL (ref 1.5–4.5)
Potassium: 4.2 mmol/L (ref 3.5–5.2)
SODIUM: 144 mmol/L (ref 134–144)
TOTAL PROTEIN: 6.7 g/dL (ref 6.0–8.5)

## 2018-02-22 LAB — VITAMIN D 25 HYDROXY (VIT D DEFICIENCY, FRACTURES): Vit D, 25-Hydroxy: 29.5 ng/mL — ABNORMAL LOW (ref 30.0–100.0)

## 2018-02-22 LAB — RPR QUALITATIVE: RPR: NONREACTIVE

## 2018-02-22 LAB — ABO/RH: Rh Factor: POSITIVE

## 2018-02-22 LAB — CBC WITH DIFFERENTIAL/PLATELET
Basophils Absolute: 0 x10E3/uL (ref 0.0–0.2)
Basos: 0 %
EOS (ABSOLUTE): 0.2 x10E3/uL (ref 0.0–0.4)
Eos: 2 %
Hematocrit: 44.6 % (ref 34.0–46.6)
Hemoglobin: 14.7 g/dL (ref 11.1–15.9)
Immature Grans (Abs): 0.1 x10E3/uL (ref 0.0–0.1)
Immature Granulocytes: 1 %
Lymphocytes Absolute: 2 x10E3/uL (ref 0.7–3.1)
Lymphs: 19 %
MCH: 25.8 pg — ABNORMAL LOW (ref 26.6–33.0)
MCHC: 33 g/dL (ref 31.5–35.7)
MCV: 78 fL — ABNORMAL LOW (ref 79–97)
Monocytes Absolute: 0.5 x10E3/uL (ref 0.1–0.9)
Monocytes: 5 %
Neutrophils Absolute: 7.4 x10E3/uL — ABNORMAL HIGH (ref 1.4–7.0)
Neutrophils: 73 %
Platelets: 220 x10E3/uL (ref 150–450)
RBC: 5.7 x10E6/uL — ABNORMAL HIGH (ref 3.77–5.28)
RDW: 17.9 % — ABNORMAL HIGH (ref 12.3–15.4)
WBC: 10.2 x10E3/uL (ref 3.4–10.8)

## 2018-02-22 LAB — HIV ANTIBODY (ROUTINE TESTING W REFLEX): HIV Screen 4th Generation wRfx: NONREACTIVE

## 2018-02-22 LAB — FOLLICLE STIMULATING HORMONE: FSH: 14.6 m[IU]/mL

## 2018-02-22 LAB — LUTEINIZING HORMONE: LH: 6.1 m[IU]/mL

## 2018-02-22 LAB — HEPATITIS B CORE ANTIBODY, IGM: Hep B C IgM: NEGATIVE

## 2018-02-22 LAB — ANTI MULLERIAN HORMONE

## 2018-02-22 LAB — TESTOSTERONE: Testosterone: 15 ng/dL (ref 3–41)

## 2018-02-22 LAB — PROLACTIN: Prolactin: 9.3 ng/mL (ref 4.8–23.3)

## 2018-02-22 LAB — HEPATITIS C ANTIBODY

## 2018-02-22 LAB — ESTRADIOL: ESTRADIOL: 14.8 pg/mL

## 2018-02-22 LAB — ANTIBODY SCREEN: Antibody Screen: NEGATIVE

## 2018-02-22 LAB — RUBELLA SCREEN: RUBELLA: 2.41 {index} (ref >0.99)

## 2018-02-22 LAB — BETA HCG QUANT (REF LAB): hCG Quant: <1 m[IU]/mL

## 2018-02-22 LAB — PROGESTERONE: Progesterone: 0.1 ng/mL

## 2018-02-22 LAB — TSH: TSH: 0.401 u[IU]/mL — ABNORMAL LOW (ref 0.450–4.500)

## 2018-02-22 LAB — HEPATITIS B SURFACE ANTIGEN: HEP B S AG: NEGATIVE

## 2018-02-22 NOTE — Telephone Encounter (Signed)
Patient is aware of appointment tomorrow, 02/23/18 @ 11:30am at [facility]

## 2018-02-22 NOTE — Telephone Encounter (Signed)
Patient is calling wanting to know the status of a hysterogram that needs to be schedule at [facility]. Patient reports she needs to have this done with in 2 day. SDJ out of office this week. Please advise

## 2018-02-23 ENCOUNTER — Other Ambulatory Visit: Payer: Self-pay | Admitting: Obstetrics and Gynecology

## 2018-02-23 ENCOUNTER — Ambulatory Visit (INDEPENDENT_AMBULATORY_CARE_PROVIDER_SITE_OTHER): Payer: Self-pay | Admitting: Obstetrics and Gynecology

## 2018-02-23 ENCOUNTER — Encounter: Payer: Self-pay | Admitting: Obstetrics and Gynecology

## 2018-02-23 ENCOUNTER — Ambulatory Visit (INDEPENDENT_AMBULATORY_CARE_PROVIDER_SITE_OTHER): Payer: Self-pay

## 2018-02-23 VITALS — BP 142/84 | HR 89 | Ht 64.0 in | Wt 224.0 lb

## 2018-02-23 DIAGNOSIS — Z3141 Encounter for fertility testing: Secondary | ICD-10-CM

## 2018-02-23 DIAGNOSIS — E289 Ovarian dysfunction, unspecified: Secondary | ICD-10-CM

## 2018-02-23 DIAGNOSIS — D259 Leiomyoma of uterus, unspecified: Secondary | ICD-10-CM

## 2018-02-23 DIAGNOSIS — N979 Female infertility, unspecified: Secondary | ICD-10-CM

## 2018-02-23 NOTE — Progress Notes (Signed)
Gynecology Ultrasound Follow Up  Chief Complaint:  Chief Complaint  Patient presents with  . SIS     History of Present Illness: Patient is a 52 y.o. female who presents today for ultrasound evaluation of infertility .Prior to proceeding with the study I verified that the patient was aware that the spontaneous conception rate over 50 was less than 1% which she was.  She also reports that she had undergone AMH testing which was low and had been counseled that her best means of conception was donor eggs.  Given that she is contemplating this option potentially ruling out an intracaviatary defect was reasonable.    Ultrasound demonstrates the following findgins Adnexa: no masses seen Uterus: ONe small anterior fibroid with endometrial stripe  without focal defects on SIS Additional: no free fluid  Review of Systems:ROS negative unless otherwise noted in HPI  Past Medical History:  Past Medical History:  Diagnosis Date  . Hypertension   . Hypothyroidism   . Metabolic syndrome     Past Surgical History:  Past Surgical History:  Procedure Laterality Date  . DENTAL SURGERY      Gynecologic History:  Patient's last menstrual period was 02/16/2018 (exact date). Contraception: none  Family History:  Family History  Problem Relation Age of Onset  . Kidney disease Father   . Diabetes Father     Social History:  Social History   Socioeconomic History  . Marital status: Single    Spouse name: Not on file  . Number of children: Not on file  . Years of education: Not on file  . Highest education level: Not on file  Occupational History  . Not on file  Social Needs  . Financial resource strain: Not on file  . Food insecurity:    Worry: Not on file    Inability: Not on file  . Transportation needs:    Medical: Not on file    Non-medical: Not on file  Tobacco Use  . Smoking status: Never Smoker  . Smokeless tobacco: Never Used  Substance and Sexual Activity  .  Alcohol use: No    Frequency: Never  . Drug use: No  . Sexual activity: Yes  Lifestyle  . Physical activity:    Days per week: Not on file    Minutes per session: Not on file  . Stress: Not on file  Relationships  . Social connections:    Talks on phone: Not on file    Gets together: Not on file    Attends religious service: Not on file    Active member of club or organization: Not on file    Attends meetings of clubs or organizations: Not on file    Relationship status: Not on file  . Intimate partner violence:    Fear of current or ex partner: Not on file    Emotionally abused: Not on file    Physically abused: Not on file    Forced sexual activity: Not on file  Other Topics Concern  . Not on file  Social History Narrative  . Not on file    Allergies:  Allergies  Allergen Reactions  . Sulfa Antibiotics     Medications: Prior to Admission medications   Medication Sig Start Date End Date Taking? Authorizing Provider  amLODipine (NORVASC) 10 MG tablet Take 1 tablet (10 mg total) by mouth daily. 10/24/17  Yes Meredith Staggers, MD  ARMOUR THYROID PO Take by mouth.   Yes [provider]  Kava, Piper methysticum, (KAVA KAVA PO) Take by mouth.   Yes [provider]  metFORMIN (GLUCOPHAGE) 500 MG tablet Take by mouth 2 (two) times daily with a meal.   Yes [provider]  NON FORMULARY    Yes [provider]  NON FORMULARY    Yes [provider]  progesterone (PROMETRIUM) 100 MG capsule Take 100 mg by mouth daily.   Yes [provider]  Turmeric POWD by Does not apply route.   Yes [provider]    Physical Exam Vitals: Blood pressure (!) 142/84, pulse 89, height 5\' 4"  (1.626 m), weight 224 lb (101.6 kg), last menstrual period 02/16/2018.  General: NAD HEENT: normocephalic, anicteric Pulmonary: No increased work of breathing Extremities: no edema, erythema, or tenderness Neurologic: Grossly intact, normal  gait Psychiatric: mood appropriate, affect full   Assessment: 52 y.o. G1P0010 No problem-specific Assessment & Plan notes found for this encounter.   Plan: Problem List Items Addressed This Visit    None    Visit Diagnoses    Female infertility    -  Primary      1) Study findings reviewed with patient.  Fibroid have previously been documented and are noted to be intramural not intracavitary.   2) A total of 10 minutes were spent in face-to-face contact with the patient during this encounter with over half of that time devoted to counseling and coordination of care.     Malachy Mood, MD, [facility], [place]

## 2018-03-08 ENCOUNTER — Ambulatory Visit (INDEPENDENT_AMBULATORY_CARE_PROVIDER_SITE_OTHER): Payer: Self-pay | Admitting: Physician Assistant

## 2018-03-10 ENCOUNTER — Telehealth: Payer: Self-pay

## 2018-03-10 NOTE — Telephone Encounter (Signed)
Spoke w/pt. She was seen today at [facility], [place] phone#[phone]. Advised I will discuss with HIM on Monday & notify patient once records have been sent.

## 2018-03-10 NOTE — Telephone Encounter (Signed)
Pt had 2 sonograms & blood work done a few weeks ago. Results were to be sent to the [facility] in [place] ([place]). She was just there & her results were not received. Pt looking for assistance in getting these results. This is just a baseline & isn't time sensitive. However, she will be doing retrieval which will be time sensitive. MB#[phone]

## 2018-03-28 ENCOUNTER — Encounter: Payer: Self-pay | Admitting: Obstetrics and Gynecology

## 2018-04-05 NOTE — Telephone Encounter (Signed)
I looked for it and can't find it.  Would you mind asking Rise Paganini?

## 2018-04-05 NOTE — Telephone Encounter (Signed)
This encounter was created in error - please disregard.

## 2018-04-05 NOTE — Telephone Encounter (Signed)
Spoke with patient regarding billing. I will defer to [facility]. Discussed how patient might better get timely results.

## 2018-04-05 NOTE — Telephone Encounter (Signed)
Late entry:  Pt's partner, Reita Chard, [place] [birth year]1976, called 9/6 for sperm results.  Adv results weren't back yet - it can take 2-3 weeks before we get results back. Adv I would look into it.

## 2018-04-06 ENCOUNTER — Ambulatory Visit (INDEPENDENT_AMBULATORY_CARE_PROVIDER_SITE_OTHER): Payer: Self-pay | Admitting: Physician Assistant

## 2018-04-06 ENCOUNTER — Other Ambulatory Visit: Payer: Self-pay

## 2018-04-06 ENCOUNTER — Encounter (INDEPENDENT_AMBULATORY_CARE_PROVIDER_SITE_OTHER): Payer: Self-pay | Admitting: Physician Assistant

## 2018-04-06 VITALS — BP 159/98 | HR 73 | Temp 98.3°F | Ht 64.0 in | Wt 217.8 lb

## 2018-04-06 DIAGNOSIS — R739 Hyperglycemia, unspecified: Secondary | ICD-10-CM

## 2018-04-06 DIAGNOSIS — R400 Somnolence: Secondary | ICD-10-CM

## 2018-04-06 DIAGNOSIS — R0683 Snoring: Secondary | ICD-10-CM

## 2018-04-06 DIAGNOSIS — I10 Essential (primary) hypertension: Secondary | ICD-10-CM

## 2018-04-06 DIAGNOSIS — E889 Metabolic disorder, unspecified: Secondary | ICD-10-CM

## 2018-04-06 DIAGNOSIS — E079 Disorder of thyroid, unspecified: Secondary | ICD-10-CM

## 2018-04-06 LAB — POCT GLYCOSYLATED HEMOGLOBIN (HGB A1C): HEMOGLOBIN A1C: 5.6 % (ref 4.0–5.6)

## 2018-04-06 NOTE — Progress Notes (Signed)
Subjective:  Patient ID: Cassie [name], female    DOB: [birth year]  Age: 52 y.o. MRN: [id number]  CC: HTN and numbness of foot  HPI Cassie [name] is a 52 y.o. female with a medical history of HTN, hypothryoidism, metabolic disease, female infertility, and obesity presents as a new patient to address hospitalization that occurred nearly eight months ago. Was admitted on 08/17/17 for acute hypoxic respiratory failure secondary to severe influenza A PNA with presumed superimposed bacterial infection. At one point needed pressor support for septic shock and AKI. She was intubated for respiratory support on 1/25, she failed extubation due to upper airway obstruction and stridor. Tracheostomy was placed on 09/02/17. Discharged on 09/10/17. Had f/u appointments with pulmonology and Phys Med. Pt asks how long would it take for her tracheostomy to fully heal. Still has some hoarseness to her voice. Unable to sing as she did before and can not speak long sentences without having to stop and catch her breath. 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 Patient would also like to know the result of the respiratory culture she had done during her hospitalization. Upon review, tracheal aspirate revealed "ABUNDANT WBC PRESENT,BOTH PMN AND MONONUCLEAR FEW YEAST. RARE CANDIDA ALBICANS". 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     Patient is also noted to have elevated BP today. States she has been feeling fatigued. Reports morning headaches, snoring, occasional nap during the day, and difficulty losing weight. Stop-Bang score 6/8.  Does not report any other complaints or symptoms.     Outpatient Medications Prior to Visit  Medication Sig Dispense Refill  . ARMOUR THYROID PO Take by mouth.    . Kava, Piper methysticum, (KAVA KAVA PO) Take by mouth.    .  metFORMIN (GLUCOPHAGE) 500 MG tablet Take by mouth 2 (two) times daily with a meal.    . NON FORMULARY     . NON FORMULARY     . progesterone (PROMETRIUM) 100 MG capsule Take 100 mg by mouth daily.    . Turmeric POWD by Does not apply route.    Marland Kitchen amLODipine (NORVASC) 10 MG tablet Take 1 tablet (10 mg total) by mouth daily. (Patient not taking: Reported on 04/06/2018) 30 tablet 4   No facility-administered medications prior to visit.      ROS Review of Systems  Constitutional: Negative for chills, fever and malaise/fatigue.  Eyes: Negative for blurred vision.  Respiratory: Negative for shortness of breath.   Cardiovascular: Negative for chest pain and palpitations.  Gastrointestinal: Negative for abdominal pain and nausea.  Genitourinary: Negative for dysuria and hematuria.  Musculoskeletal: Negative for joint pain and myalgias.  Skin: Negative for rash.  Neurological: Positive for headaches (morning). Negative for tingling.  Psychiatric/Behavioral: Negative for depression. The patient is not nervous/anxious.     Objective:  BP (!) 159/98 (BP Location: Left Arm, Patient Position: Sitting, Cuff Size: Large)   Pulse 73   Temp 98.3 F (36.8 C) (Oral)   Ht 5\' 4"  (1.626 m)   Wt 217 lb 12.8 oz (98.8 kg)   LMP 03/24/2018 (Exact Date)   SpO2 90%   BMI 37.39 kg/m   BP/Weight 04/06/2018 02/23/2018 0/86/5784  Systolic BP 696 295 284  Diastolic BP 98 84 88  Wt. (Lbs) 217.8 224 225  BMI 37.39 38.45 38.62      Physical Exam  Constitutional: She is oriented to person, place, and time.  Well developed, obese, NAD, polite  HENT:  Head: Normocephalic and atraumatic.  Eyes: No scleral icterus.  Neck: Normal range of motion. Neck supple. No thyromegaly present.  Cardiovascular: Normal rate, regular rhythm and normal heart sounds.  Pulmonary/Chest: Effort normal and breath sounds normal.  Has an airy quality to her voice.   Abdominal: Soft. Bowel sounds are normal. There is no  tenderness.  Musculoskeletal: She exhibits no edema.  Neurological: She is alert and oriented to person, place, and time.  Skin: Skin is warm and dry. No rash noted. No erythema. No pallor.  Closed tracheostomy.   Psychiatric: She has a normal mood and affect. Her behavior is normal. Thought content normal.  Vitals reviewed.    Assessment & Plan:    1. Hyperglycemia - HgB A1c 5.6% today  2. Hypertension, unspecified type - Thyroid Panel With TSH - Lipid panel - Comprehensive metabolic panel - Nocturnal polysomnography (NPSG); Future - Discussed possible addition of anti-hypertensive to her regimen but would rather see if she has OSA first and have treated. She can also consider a diet and exercise regimen. This is reasonable, but an additional anti-hypertensive is still a possibility should BP still be elevated at next visit.   3. Thyroid disease - Thyroid Panel With TSH  4. Metabolic disease - Thyroid Panel With TSH - Lipid panel - Comprehensive metabolic panel  5. Morbid obesity ([address]) - Nocturnal polysomnography (NPSG); Future  6. Daytime somnolence - Nocturnal polysomnography (NPSG); Future  7. Snoring - Nocturnal polysomnography (NPSG); Future    Follow-up: Return in about 8 weeks (around 06/01/2018) for thyroid .   Clent Demark PA

## 2018-04-06 NOTE — Telephone Encounter (Signed)
Placed on [facility] desk for fax to [facility]

## 2018-04-06 NOTE — Patient Instructions (Signed)

## 2018-04-07 ENCOUNTER — Encounter (INDEPENDENT_AMBULATORY_CARE_PROVIDER_SITE_OTHER): Payer: Self-pay | Admitting: Physician Assistant

## 2018-04-07 ENCOUNTER — Telehealth (INDEPENDENT_AMBULATORY_CARE_PROVIDER_SITE_OTHER): Payer: Self-pay

## 2018-04-07 ENCOUNTER — Other Ambulatory Visit (INDEPENDENT_AMBULATORY_CARE_PROVIDER_SITE_OTHER): Payer: Self-pay | Admitting: Physician Assistant

## 2018-04-07 DIAGNOSIS — E7841 Elevated Lipoprotein(a): Secondary | ICD-10-CM | POA: Insufficient documentation

## 2018-04-07 LAB — COMPREHENSIVE METABOLIC PANEL
A/G RATIO: 1.9 (ref 1.2–2.2)
ALT: 25 IU/L (ref 0–32)
AST: 15 IU/L (ref 0–40)
Albumin: 4.5 g/dL (ref 3.5–5.5)
Alkaline Phosphatase: 93 IU/L (ref 39–117)
BUN/Creatinine Ratio: 20 (ref 9–23)
BUN: 14 mg/dL (ref 6–24)
Bilirubin Total: 0.4 mg/dL (ref 0.0–1.2)
CALCIUM: 9.9 mg/dL (ref 8.7–10.2)
CO2: 23 mmol/L (ref 20–29)
CREATININE: 0.69 mg/dL (ref 0.57–1.00)
Chloride: 103 mmol/L (ref 96–106)
GFR, EST AFRICAN AMERICAN: 116 mL/min/{1.73_m2} (ref >59)
GFR, EST NON AFRICAN AMERICAN: 100 mL/min/{1.73_m2} (ref >59)
GLUCOSE: 86 mg/dL (ref 65–99)
Globulin, Total: 2.4 g/dL (ref 1.5–4.5)
Potassium: 4 mmol/L (ref 3.5–5.2)
Sodium: 142 mmol/L (ref 134–144)
TOTAL PROTEIN: 6.9 g/dL (ref 6.0–8.5)

## 2018-04-07 LAB — LIPID PANEL
CHOL/HDL RATIO: 6.2 ratio — AB (ref 0.0–4.4)
Cholesterol, Total: 191 mg/dL (ref 100–199)
HDL: 31 mg/dL — ABNORMAL LOW (ref >39)
LDL Calculated: 124 mg/dL — ABNORMAL HIGH (ref 0–99)
TRIGLYCERIDES: 181 mg/dL — AB (ref 0–149)
VLDL Cholesterol Cal: 36 mg/dL (ref 5–40)

## 2018-04-07 LAB — THYROID PANEL WITH TSH
Free Thyroxine Index: 2.1 (ref 1.2–4.9)
T3 UPTAKE RATIO: 29 % (ref 24–39)
T4 TOTAL: 7.1 ug/dL (ref 4.5–12.0)
TSH: 0.472 u[IU]/mL (ref 0.450–4.500)

## 2018-04-07 MED ORDER — THYROID 90 MG PO TABS: 90.0000 mg | ORAL_TABLET | Freq: Every day | ORAL | 1 refills | Status: DC

## 2018-04-07 MED ORDER — ATORVASTATIN CALCIUM 40 MG PO TABS: 40.0000 mg | ORAL_TABLET | Freq: Every day | ORAL | 3 refills | Status: DC

## 2018-04-07 NOTE — Telephone Encounter (Signed)
Medication sent to [facility]. Thank you.

## 2018-04-07 NOTE — Telephone Encounter (Signed)
-----   Message from Clent Demark, PA-C sent at 04/07/2018  8:38 AM EDT ----- Cholesterol elevated. I have sent atorvastatin to her pharmacy. Thyroid stable. Pt needs to tell us the current dose of her thyroid medication so that I can refill the same dose.

## 2018-04-07 NOTE — Telephone Encounter (Signed)
Patient aware that cholesterol is elevated; atorvastatin sent to pharmacy to help reduce her cholesterol. Thyroid stable. Will refill thyroid medication at current dose. Patient states dose is 90mg . Nat Christen, CMA

## 2018-04-12 NOTE — Telephone Encounter (Signed)
MR please advise on patient e-mail below. Thank you.  

## 2018-04-13 NOTE — Telephone Encounter (Signed)
1. A CMA needs to dig into the admission records from flu/ARDS to see if she was given pneumonia vaccine. If so, which one and date. Is typically practice in hospital they give.  Surprised imm hz in office epic is "none"   2 . Please tell her I appreciate her thanks and gratitude . Glad she has been proactive and found that support group     SIGNATURE    Dr. Brand Males, M.D., F.C.C.P,  Pulmonary and Critical Care Medicine Staff Physician, [employer] Director - Interstitial Lung Disease  Program  [facility] at [place], [place], [address]  Pager: [phone], If no answer or between  15:00h - 7:00h: call [phone] Telephone: [phone]  1:31 PM 04/13/2018

## 2018-04-17 NOTE — Telephone Encounter (Signed)
Cassie [name] should have pneumovax first and a year later get prevnar and then 7 years later or when she turns 59 she should get pneumovax repeat shot

## 2018-04-20 ENCOUNTER — Telehealth (INDEPENDENT_AMBULATORY_CARE_PROVIDER_SITE_OTHER): Payer: Self-pay | Admitting: Physician Assistant

## 2018-04-20 NOTE — Telephone Encounter (Signed)
Patient called wanting to schedule an appointment to get lab work to check and see if her live enzymes are elevated and also to see if she is anemic. Patient stated that she went to a fertility clinic in [facility] and was told that her liver enzymes were elevated and that she was anemic. Patient also stated that she was advice to follow up with her PCP and in order to get clearance and continue with fertility treatment.  Please advice [phone]  Thank you Emmit Pomfret

## 2018-04-21 NOTE — Telephone Encounter (Signed)
Spoke with [facility] and they advised patient to see primary care because of elevated liver enzymes and anemia. She was not told to have testing to be cleared for fertility process. Patient has already had CMP but has not had a CBC since July. RBC in July were on very slightly elevated. Have attempted to contact patient twice and left voicemail asking her to return call to [facility]. Please advise. Nat Christen, CMA

## 2018-04-25 ENCOUNTER — Telehealth (INDEPENDENT_AMBULATORY_CARE_PROVIDER_SITE_OTHER): Payer: Self-pay | Admitting: Physician Assistant

## 2018-04-25 ENCOUNTER — Other Ambulatory Visit (INDEPENDENT_AMBULATORY_CARE_PROVIDER_SITE_OTHER): Payer: Self-pay | Admitting: Physician Assistant

## 2018-04-25 DIAGNOSIS — D649 Anemia, unspecified: Secondary | ICD-10-CM

## 2018-04-25 DIAGNOSIS — R748 Abnormal levels of other serum enzymes: Secondary | ICD-10-CM

## 2018-04-25 NOTE — Telephone Encounter (Signed)
Patient called and left a voicemail stating that she had gone to her fertility clinic in [place] back in July and was told that her liver enzymes were elevated and that she had anemia. Patient would like to know if she is cleared from this. She would also like her labs to be faxed to [facility] fertility clinic if everything came back normal and was cleared to continue treatment. If she is not cleared and her liver enzymes are elevated and is still anemic she would like to re-do her labs.  Please advice [phone]  Thank you Emmit Pomfret

## 2018-04-25 NOTE — Telephone Encounter (Signed)
Left message asking patient to return call to [facility]. Nihar Klus S Danee Soller, CMA  

## 2018-04-25 NOTE — Progress Notes (Signed)
Pt states she was found to have elevated liver enzymes and anemia at her fertility clinic in [place]. She requested recheck of liver enzymes and a check for anemia.

## 2018-04-25 NOTE — Telephone Encounter (Signed)
I have order testing for her. Make lab appointment.

## 2018-04-26 NOTE — Telephone Encounter (Signed)
PCP has ordered blood work. Patient can come in for lab only appointment. If she calls please notify and schedule lab only visit. Nat Christen, CMA

## 2018-04-27 ENCOUNTER — Telehealth: Payer: Self-pay

## 2018-04-27 NOTE — Telephone Encounter (Signed)
Patient called with normal LFTs from 9/12 draw and normal H&H from 7/26.

## 2018-04-27 NOTE — Telephone Encounter (Signed)
Pt's blood work from fertility clinic showed elevated LFTs and anemia 02/10/18.  Calling to see if blood work we drew shows it has resolved.  [phone]

## 2018-05-02 NOTE — Telephone Encounter (Signed)
Patient is aware and advice that she could come in for just a lab visit but stated she would like to wait until her appointment on 11-7.  Emmit Pomfret

## 2018-05-22 ENCOUNTER — Encounter (INDEPENDENT_AMBULATORY_CARE_PROVIDER_SITE_OTHER): Payer: Self-pay | Admitting: Physician Assistant

## 2018-05-23 ENCOUNTER — Encounter (HOSPITAL_BASED_OUTPATIENT_CLINIC_OR_DEPARTMENT_OTHER): Payer: Self-pay

## 2018-06-01 ENCOUNTER — Ambulatory Visit (INDEPENDENT_AMBULATORY_CARE_PROVIDER_SITE_OTHER): Payer: Self-pay | Admitting: Physician Assistant

## 2018-06-01 ENCOUNTER — Other Ambulatory Visit: Payer: Self-pay

## 2018-06-01 ENCOUNTER — Encounter (INDEPENDENT_AMBULATORY_CARE_PROVIDER_SITE_OTHER): Payer: Self-pay | Admitting: Physician Assistant

## 2018-06-01 VITALS — BP 171/117 | HR 82 | Temp 98.1°F | Ht 64.0 in | Wt 226.6 lb

## 2018-06-01 DIAGNOSIS — R9431 Abnormal electrocardiogram [ECG] [EKG]: Secondary | ICD-10-CM

## 2018-06-01 DIAGNOSIS — E079 Disorder of thyroid, unspecified: Secondary | ICD-10-CM

## 2018-06-01 DIAGNOSIS — E785 Hyperlipidemia, unspecified: Secondary | ICD-10-CM

## 2018-06-01 DIAGNOSIS — Z01810 Encounter for preprocedural cardiovascular examination: Secondary | ICD-10-CM

## 2018-06-01 DIAGNOSIS — E781 Pure hyperglyceridemia: Secondary | ICD-10-CM

## 2018-06-01 DIAGNOSIS — Z1239 Encounter for other screening for malignant neoplasm of breast: Secondary | ICD-10-CM

## 2018-06-01 DIAGNOSIS — I10 Essential (primary) hypertension: Secondary | ICD-10-CM

## 2018-06-01 MED ORDER — LABETALOL HCL 100 MG PO TABS: 100.0000 mg | ORAL_TABLET | Freq: Two times a day (BID) | ORAL | 2 refills | Status: DC

## 2018-06-01 MED ORDER — NIFEDIPINE ER OSMOTIC RELEASE 30 MG PO TB24: 30.0000 mg | ORAL_TABLET | Freq: Every day | ORAL | 2 refills | Status: DC

## 2018-06-01 NOTE — Patient Instructions (Signed)

## 2018-06-01 NOTE — Progress Notes (Signed)
Subjective:  Patient ID: Cassie [name], female    DOB: 1966/01/18  Age: 52 y.o. MRN: [id number]  CC: f/u thyroid  HPI Cassie [name] is a 52 y.o. female with a medical history of HTN, hypothryoidism, metabolic disease, female infertility, obesity, and ARDS presents for thyroid panel lab draw. Pt also would like a medical clearance from PCP for IVF procedure in high risk patient. Says she will be receiving a novel treatment at [facility] in [place], [place]. Needs EKG, Mammogram, Colon cancer screening, and A1c according to her form. Says her OB/GYN Dr. Lupita Dawn has cleared her for her procedure. Pt is aware she is high risk for pregnancy due to her age, weight, and comorbidities of HTN, hypothyroidism, HLD, and metabolic disease. Feels this procedure may be her last chance to have a child of her own.     Pt's BP is elevated today. Had stopped amlodipine 10 mg on her own accord due to bilateral lower extremity swelling in the evenings. Does not endorse any cardiovascular symptoms. Said she had a procedure some time ago to check for calcification from her neck to the legs and was found to have minimal to no calcification. Does not know if her heart was checked for atherosclerosis/obstruction.       Outpatient Medications Prior to Visit  Medication Sig Dispense Refill  . Kava, Piper methysticum, (KAVA KAVA PO) Take by mouth.    . metFORMIN (GLUCOPHAGE) 500 MG tablet Take by mouth 2 (two) times daily with a meal.    . NON FORMULARY     . NON FORMULARY     . progesterone (PROMETRIUM) 100 MG capsule Take 100 mg by mouth daily.    Marland Kitchen thyroid (ARMOUR THYROID) 90 MG tablet Take 1 tablet (90 mg total) by mouth daily. 90 tablet 1  . Turmeric POWD by Does not apply route.    Marland Kitchen amLODipine (NORVASC) 10 MG tablet Take 1 tablet (10 mg total) by mouth daily. (Patient not taking: Reported on 04/06/2018) 30 tablet 4  . atorvastatin (LIPITOR) 40 MG tablet Take 1 tablet (40 mg total) by mouth  daily. (Patient not taking: Reported on 06/01/2018) 90 tablet 3   No facility-administered medications prior to visit.      ROS Review of Systems  Constitutional: Negative for chills, fever and malaise/fatigue.  Eyes: Negative for blurred vision.  Respiratory: Negative for shortness of breath.   Cardiovascular: Negative for chest pain and palpitations.  Gastrointestinal: Negative for abdominal pain and nausea.  Genitourinary: Negative for dysuria and hematuria.  Musculoskeletal: Negative for joint pain and myalgias.  Skin: Negative for rash.  Neurological: Negative for tingling and headaches.  Psychiatric/Behavioral: Negative for depression. The patient is not nervous/anxious.     Objective:  Ht 5\' 4"  (1.626 m)   Wt 226 lb 9.6 oz (102.8 kg)   LMP 05/25/2018 (Exact Date)   BMI 38.90 kg/m   Vitals:   06/01/18 1533 06/01/18 1556  BP: (!) 179/112 (!) 171/117  Pulse: 85 82  Temp: 98.1 F (36.7 C)   TempSrc: Oral   SpO2: 92%   Weight: 226 lb 9.6 oz (102.8 kg)   Height: 5\' 4"  (1.626 m)      Physical Exam  Constitutional: She is oriented to person, place, and time.  Well developed, obese, NAD, polite  HENT:  Head: Normocephalic and atraumatic.  Eyes: No scleral icterus.  Neck: Normal range of motion. Neck supple. No thyromegaly present.  Cardiovascular: Normal rate, regular rhythm and normal heart  sounds.  Pulmonary/Chest: Effort normal and breath sounds normal.  Abdominal: Soft. Bowel sounds are normal. There is no tenderness.  Musculoskeletal: She exhibits no edema.  Neurological: She is alert and oriented to person, place, and time.  Skin: Skin is warm and dry. No rash noted. No erythema. No pallor.  Psychiatric: She has a normal mood and affect. Her behavior is normal. Thought content normal.  Vitals reviewed.    Assessment & Plan:    1. Pre-operative cardiovascular examination - MM DIGITAL SCREENING BILATERAL; Future - EKG 12-Lead with nonspecific T wave  abnormalities.   2. Thyroid disease - Thyroid Panel With TSH  3. Hypertension, unspecified type - Begin NIFEdipine (PROCARDIA-XL/NIFEDICAL-XL) 30 MG 24 hr tablet; Take 1 tablet (30 mg total) by mouth daily.  Dispense: 30 tablet; Refill: 2 - Begin labetalol (NORMODYNE) 100 MG tablet; Take 1 tablet (100 mg total) by mouth 2 (two) times daily.  Dispense: 60 tablet; Refill: 2 - Comprehensive metabolic panel - CBC with Differential  4. Abnormal EKG - Ambulatory referral to Cardiology  5. Morbid obesity ([place]) - Pt aware of need to exercise but finds it difficult to set aside the time to exercise.   6. Hyperlipidemia, unspecified hyperlipidemia type - Pt had to stop statin treatment due to possible teratogenic effects on the child should she become pregnant.   7. Hypertriglyceridemia - Pt had to stop statin treatment due to possible teratogenic effects on the child should she become pregnant.   8. Screening for breast cancer - MM DIGITAL SCREENING BILATERAL; Future - Per [facility] request    Meds ordered this encounter  Medications  . NIFEdipine (PROCARDIA-XL/NIFEDICAL-XL) 30 MG 24 hr tablet    Sig: Take 1 tablet (30 mg total) by mouth daily.    Dispense:  30 tablet    Refill:  2    Order Specific Question:   Supervising Provider    Answer:   Charlott Rakes [[id number]]  . labetalol (NORMODYNE) 100 MG tablet    Sig: Take 1 tablet (100 mg total) by mouth 2 (two) times daily.    Dispense:  60 tablet    Refill:  2    Order Specific Question:   Supervising Provider    Answer:   Charlott Rakes [[id number]]    Follow-up: Return in about 4 weeks (around 06/29/2018) for HTN.   Clent Demark PA

## 2018-06-02 ENCOUNTER — Other Ambulatory Visit (INDEPENDENT_AMBULATORY_CARE_PROVIDER_SITE_OTHER): Payer: Self-pay | Admitting: Physician Assistant

## 2018-06-02 ENCOUNTER — Telehealth (INDEPENDENT_AMBULATORY_CARE_PROVIDER_SITE_OTHER): Payer: Self-pay

## 2018-06-02 LAB — CBC WITH DIFFERENTIAL/PLATELET
Basophils Absolute: 0.1 10*3/uL (ref 0.0–0.2)
Basos: 1 %
EOS (ABSOLUTE): 0.2 10*3/uL (ref 0.0–0.4)
EOS: 2 %
HEMATOCRIT: 39.9 % (ref 34.0–46.6)
HEMOGLOBIN: 13.7 g/dL (ref 11.1–15.9)
IMMATURE GRANS (ABS): 0.1 10*3/uL (ref 0.0–0.1)
IMMATURE GRANULOCYTES: 1 %
LYMPHS: 23 %
Lymphocytes Absolute: 2.6 10*3/uL (ref 0.7–3.1)
MCH: 26.9 pg (ref 26.6–33.0)
MCHC: 34.3 g/dL (ref 31.5–35.7)
MCV: 78 fL — AB (ref 79–97)
MONOCYTES: 6 %
MONOS ABS: 0.6 10*3/uL (ref 0.1–0.9)
NEUTROS PCT: 67 %
Neutrophils Absolute: 7.7 10*3/uL — ABNORMAL HIGH (ref 1.4–7.0)
Platelets: 324 10*3/uL (ref 150–450)
RBC: 5.09 x10E6/uL (ref 3.77–5.28)
RDW: 14.6 % (ref 12.3–15.4)
WBC: 11.2 10*3/uL — ABNORMAL HIGH (ref 3.4–10.8)

## 2018-06-02 LAB — COMPREHENSIVE METABOLIC PANEL
ALK PHOS: 89 IU/L (ref 39–117)
ALT: 24 IU/L (ref 0–32)
AST: 17 IU/L (ref 0–40)
Albumin/Globulin Ratio: 1.8 (ref 1.2–2.2)
Albumin: 4.4 g/dL (ref 3.5–5.5)
BUN/Creatinine Ratio: 12 (ref 9–23)
BUN: 9 mg/dL (ref 6–24)
Bilirubin Total: 0.5 mg/dL (ref 0.0–1.2)
CALCIUM: 9.5 mg/dL (ref 8.7–10.2)
CO2: 25 mmol/L (ref 20–29)
CREATININE: 0.73 mg/dL (ref 0.57–1.00)
Chloride: 102 mmol/L (ref 96–106)
GFR calc Af Amer: 109 mL/min/{1.73_m2} (ref >59)
GFR, EST NON AFRICAN AMERICAN: 94 mL/min/{1.73_m2} (ref >59)
GLOBULIN, TOTAL: 2.5 g/dL (ref 1.5–4.5)
GLUCOSE: 93 mg/dL (ref 65–99)
Potassium: 3.9 mmol/L (ref 3.5–5.2)
Sodium: 140 mmol/L (ref 134–144)
Total Protein: 6.9 g/dL (ref 6.0–8.5)

## 2018-06-02 LAB — THYROID PANEL WITH TSH
Free Thyroxine Index: 2.1 (ref 1.2–4.9)
T3 Uptake Ratio: 29 % (ref 24–39)
T4 TOTAL: 7.3 ug/dL (ref 4.5–12.0)
TSH: 0.518 u[IU]/mL (ref 0.450–4.500)

## 2018-06-02 NOTE — Telephone Encounter (Signed)
-----   Message from Clent Demark, PA-C sent at 06/02/2018 12:43 PM EST ----- Thyroid stable. Kidney and liver results normal. Mildly elevated white blood cells but not concerning unless patient feeling sick or in pain.

## 2018-06-02 NOTE — Telephone Encounter (Signed)
Patient is aware that thyroid is stable and to continue on same dose of synthroid. Kidney and liver results normal. Mildly elevated white blood cells but not concerning unless she is feeling sick or in pain. Nat Christen, CMA

## 2018-07-14 ENCOUNTER — Ambulatory Visit
Admission: RE | Admit: 2018-07-14 | Discharge: 2018-07-14 | Disposition: A | Discharge status: Home / Self Care | Payer: Self-pay | Source: Ambulatory Visit | Attending: Physician Assistant | Admitting: Physician Assistant

## 2018-07-14 DIAGNOSIS — Z1239 Encounter for other screening for malignant neoplasm of breast: Secondary | ICD-10-CM | POA: Insufficient documentation

## 2018-07-14 DIAGNOSIS — Z01810 Encounter for preprocedural cardiovascular examination: Secondary | ICD-10-CM | POA: Insufficient documentation

## 2018-07-20 ENCOUNTER — Telehealth (INDEPENDENT_AMBULATORY_CARE_PROVIDER_SITE_OTHER): Payer: Self-pay

## 2018-07-20 NOTE — Telephone Encounter (Signed)
-----   Message from Clent Demark, PA-C sent at 07/20/2018  8:56 AM EST ----- Mammogram normal.

## 2018-07-20 NOTE — Telephone Encounter (Signed)
Left voicemail notifying patient that mammogram is normal. Return call to [facility] with any questions or concerns. Nat Christen, CMA

## 2018-07-27 DIAGNOSIS — Z9889 Other specified postprocedural states: Secondary | ICD-10-CM

## 2018-07-27 DIAGNOSIS — R490 Dysphonia: Secondary | ICD-10-CM

## 2018-08-04 ENCOUNTER — Encounter: Payer: Self-pay | Admitting: Interventional Cardiology

## 2018-08-25 ENCOUNTER — Ambulatory Visit (INDEPENDENT_AMBULATORY_CARE_PROVIDER_SITE_OTHER): Payer: Self-pay | Admitting: Interventional Cardiology

## 2018-08-25 ENCOUNTER — Encounter: Payer: Self-pay | Admitting: Interventional Cardiology

## 2018-08-25 VITALS — BP 144/92 | HR 85 | Ht 64.0 in | Wt 230.0 lb

## 2018-08-25 DIAGNOSIS — R9431 Abnormal electrocardiogram [ECG] [EKG]: Secondary | ICD-10-CM

## 2018-08-25 DIAGNOSIS — I119 Hypertensive heart disease without heart failure: Secondary | ICD-10-CM

## 2018-08-25 DIAGNOSIS — I517 Cardiomegaly: Secondary | ICD-10-CM

## 2018-08-25 NOTE — Progress Notes (Signed)
Cardiology Office Note   Date:  08/25/2018   ID:  Cassie [name], DOB 25-Oct-1965, MRN [id number]  PCP:  Clent Demark, PA-C    No chief complaint on file.  Abnormal ECG  Wt Readings from Last 3 Encounters:  08/25/18 230 lb (104.3 kg)  06/01/18 226 lb 9.6 oz (102.8 kg)  04/06/18 217 lb 12.8 oz (98.8 kg)       History of Present Illness: Cassie [name] is a 53 y.o. female who is being seen today for the evaluation of abnormal ECG at the request of Clent Demark, PA-C.  She is being worked up for IVF by a doctor in [place].  Routine ECG showed lateral T wave inversions.  Of note, she had a significant illness in January 2019 where she had ARDS.  She has had chronic hypertension.  Denies : Chest pain. Dizziness. Leg edema. Nitroglycerin use. Orthopnea. Palpitations. Paroxysmal nocturnal dyspnea. Shortness of breath. Syncope.   She does not exercise much.  She has cut back on her salt intake.  She was recently prescribed both nifedipine and labetalol.  She only started the labetalol.  She did not want to start 2 medications at one time.  Past Medical History:  Diagnosis Date  . Hypertension   . Hypothyroidism   . Metabolic syndrome     Past Surgical History:  Procedure Laterality Date  . DENTAL SURGERY       Current Outpatient Medications  Medication Sig Dispense Refill  . ergocalciferol (VITAMIN D2) 1.25 MG (50000 UT) capsule Take 50,000 Units by mouth once a week.    . Kava, Piper methysticum, (KAVA KAVA PO) Take by mouth.    . labetalol (NORMODYNE) 100 MG tablet Take 1 tablet (100 mg total) by mouth 2 (two) times daily. 60 tablet 2  . metFORMIN (GLUCOPHAGE) 500 MG tablet Take by mouth 2 (two) times daily with a meal.    . NON FORMULARY     . NON FORMULARY     . progesterone (PROMETRIUM) 100 MG capsule Take 100 mg by mouth daily.    Marland Kitchen thyroid (ARMOUR THYROID) 90 MG tablet Take 1 tablet (90 mg total) by mouth daily. 90 tablet 1  . Turmeric POWD  by Does not apply route.     No current facility-administered medications for this visit.     Allergies:   Amlodipine and Sulfa antibiotics    Social History:  The patient  reports that she has never smoked. She has never used smokeless tobacco. She reports that she does not drink alcohol or use drugs.   Family History:  The patient's family history includes Diabetes in her father; Kidney disease in her father.    ROS:  Please see the history of present illness.   Otherwise, review of systems are positive for lack of exercise.   All other systems are reviewed and negative.    PHYSICAL EXAM: VS:  BP (!) 144/92   Pulse 85   Ht 5\' 4"  (1.626 m)   Wt 230 lb (104.3 kg)   SpO2 95%   BMI 39.48 kg/m  , BMI Body mass index is 39.48 kg/m. GEN: Well nourished, well developed, in no acute distress  HEENT: normal  Neck: no JVD, carotid bruits, or masses Cardiac: RRR; no murmurs, rubs, or gallops,no edema  Respiratory:  clear to auscultation bilaterally, normal work of breathing GI: soft, nontender, nondistended, + BS, obese MS: no deformity or atrophy  Skin: warm and dry, no rash Neuro:  Strength and sensation are intact Psych: euthymic mood, full affect   EKG:   The ekg ordered in November 2019 demonstrates normal sinus rhythm, lateral T wave inversions; no significant change from January 2019 ECG   Recent Labs: 09/05/2017: Magnesium 2.1 06/01/2018: ALT 24; BUN 9; Creatinine, Ser 0.73; Hemoglobin 13.7; Platelets 324; Potassium 3.9; Sodium 140; TSH 0.518   Lipid Panel    Component Value Date/Time   CHOL 191 04/06/2018 1640   TRIG 181 (H) 04/06/2018 1640   HDL 31 (L) 04/06/2018 1640   CHOLHDL 6.2 (H) 04/06/2018 1640   LDLCALC 124 (H) 04/06/2018 1640     Other studies Reviewed: Additional studies/ records that were reviewed today with results demonstrating: January 2019 echocardiogram showed normal LV function with mild LVH.  No significant valvular abnormality   ASSESSMENT  AND PLAN:  1. Abnormal ECG: I think this is consistent with LVH.  No significant structural heart disease by echocardiogram in January 2019.  Mild LVH.  No further testing needed at this time.  2. Hypertensive heart disease: I stressed the importance of blood pressure control.  She should minimize salt in her diet.  She needs to increase exercise as well.  Weight loss would be beneficial. 3. LDL 124 in September 2019.  She was prescribed atorvastatin but is never started this medication per her report. 4. No further cardiac testing needed at this time given her lack of cardiac symptoms.   Current medicines are reviewed at length with the patient today.  The patient concerns regarding her medicines were addressed.  The following changes have been made:  No change  Labs/ tests ordered today include:  No orders of the defined types were placed in this encounter.   Recommend 150 minutes/week of aerobic exercise Low fat, low carb, high fiber diet recommended  Disposition:   FU as needed   Signed, Larae Grooms, MD  08/25/2018 11:30 AM    [address], [place], [place]  [address] Phone: [phone]; Fax: [phone]

## 2018-08-25 NOTE — Patient Instructions (Signed)
Medication Instructions:  Your physician recommends that you continue on your current medications as directed. Please refer to the Current Medication list given to you today.  If you need a refill on your cardiac medications before your next appointment, please call your pharmacy.   Lab work: None Ordered  If you have labs (blood work) drawn today and your tests are completely normal, you will receive your results only by: . MyChart Message (if you have MyChart) OR . A paper copy in the mail If you have any lab test that is abnormal or we need to change your treatment, we will call you to review the results.  Testing/Procedures: None ordered  Follow-Up: . AS NEEDED  Any Other Special Instructions Will Be Listed Below (If Applicable).    

## 2018-10-23 DIAGNOSIS — I10 Essential (primary) hypertension: Secondary | ICD-10-CM

## 2018-10-25 MED ORDER — NIFEDIPINE ER 30 MG PO TB24: 30.0000 mg | ORAL_TABLET | Freq: Every day | ORAL | 3 refills | Status: DC

## 2018-10-25 MED ORDER — THYROID 90 MG PO TABS: 90.0000 mg | ORAL_TABLET | Freq: Every day | ORAL | 5 refills | Status: DC

## 2018-10-25 MED ORDER — LABETALOL HCL 100 MG PO TABS: 100.0000 mg | ORAL_TABLET | Freq: Two times a day (BID) | ORAL | 2 refills | Status: DC

## 2018-10-25 NOTE — Telephone Encounter (Signed)
meds filled

## 2018-11-15 ENCOUNTER — Ambulatory Visit (INDEPENDENT_AMBULATORY_CARE_PROVIDER_SITE_OTHER): Payer: Self-pay | Admitting: Internal Medicine

## 2018-11-15 ENCOUNTER — Other Ambulatory Visit: Payer: Self-pay

## 2018-11-15 DIAGNOSIS — E559 Vitamin D deficiency, unspecified: Secondary | ICD-10-CM | POA: Insufficient documentation

## 2018-11-15 DIAGNOSIS — E039 Hypothyroidism, unspecified: Secondary | ICD-10-CM

## 2018-11-15 DIAGNOSIS — I10 Essential (primary) hypertension: Secondary | ICD-10-CM

## 2018-11-15 NOTE — Progress Notes (Signed)
Virtual Visit via Video Note  I connected with Cassie [name] on 11/15/18 at  2:00 PM EDT by a video enabled telemedicine application and verified that I am speaking with the correct person using two identifiers.  Location patient: home Location provider: work office Persons participating in the virtual visit: patient, provider  I discussed the limitations of evaluation and management by telemedicine and the availability of in person appointments. The patient expressed understanding and agreed to proceed.   HPI: This visit is to establish care and to follow up on chronic conditions. Her PMH is significant for:  1. Morbid Obesity. She has gained a lot of weight recently.  2. Vit D def. She takes 10,000 IU daily.  3. Hypothyroidism, on 90 mg or armour thyroid.  4. HTN that she states has been well controlled on nifedipine and labetalol.  She is in a new relationship with an 54 yr old younger than her man. She has found a specialist in [place] that is willing to do infertility treatments for her (not sure how that works at age 12). In my opinion, she would be very high risk for pregnancy given her age, body habitus and medical co-morbidities.  In Jan 2019 she was hospitalized for 30 days with bilateral PNA, ARDS and sepsis. Ended up with a trach that has since been removed. She required SNF due to weakness and deconditioning, has done "very well" since then.  She is a never smoker, no illicit drugs, rare ETOH intake.  She is due for a CPE.   ROS: Constitutional: Denies fever, chills, diaphoresis, appetite change and fatigue.  HEENT: Denies photophobia, eye pain, redness, hearing loss, ear pain, congestion, sore throat, rhinorrhea, sneezing, mouth sores, trouble swallowing, neck pain, neck stiffness and tinnitus.   Respiratory: Denies SOB, DOE, cough, chest tightness,  and wheezing.   Cardiovascular: Denies chest pain, palpitations and leg swelling.  Gastrointestinal: Denies  nausea, vomiting, abdominal pain, diarrhea, constipation, blood in stool and abdominal distention.  Genitourinary: Denies dysuria, urgency, frequency, hematuria, flank pain and difficulty urinating.  Endocrine: Denies: hot or cold intolerance, sweats, changes in hair or nails, polyuria, polydipsia. Musculoskeletal: Denies myalgias, back pain, joint swelling, arthralgias and gait problem.  Skin: Denies pallor, rash and wound.  Neurological: Denies dizziness, seizures, syncope, weakness, light-headedness, numbness and headaches.  Hematological: Denies adenopathy. Easy bruising, personal or family bleeding history  Psychiatric/Behavioral: Denies suicidal ideation, mood changes, confusion, nervousness, sleep disturbance and agitation   Past Medical History:  Diagnosis Date  . Hypertension   . Hypothyroidism   . Metabolic syndrome     Past Surgical History:  Procedure Laterality Date  . DENTAL SURGERY      Family History  Problem Relation Age of Onset  . Kidney disease Father   . Diabetes Father   . Breast cancer Neg Hx     SOCIAL HX:   reports that she has never smoked. She has never used smokeless tobacco. She reports that she does not drink alcohol or use drugs.   Current Outpatient Medications:  .  ergocalciferol (VITAMIN D2) 1.25 MG (50000 UT) capsule, Take 50,000 Units by mouth once a week., Disp: , Rfl:  .  Kava, Piper methysticum, (KAVA KAVA PO), Take by mouth., Disp: , Rfl:  .  labetalol (NORMODYNE) 100 MG tablet, Take 1 tablet (100 mg total) by mouth 2 (two) times daily., Disp: 60 tablet, Rfl: 2 .  metFORMIN (GLUCOPHAGE) 500 MG tablet, Take by mouth 2 (two) times daily with a  meal., Disp: , Rfl:  .  NIFEdipine (ADALAT CC) 30 MG 24 hr tablet, Take 1 tablet (30 mg total) by mouth daily., Disp: 30 tablet, Rfl: 3 .  NON FORMULARY, , Disp: , Rfl:  .  NON FORMULARY, , Disp: , Rfl:  .  progesterone (PROMETRIUM) 100 MG capsule, Take 100 mg by mouth daily., Disp: , Rfl:  .   thyroid (ARMOUR THYROID) 90 MG tablet, Take 1 tablet (90 mg total) by mouth daily., Disp: 90 tablet, Rfl: 5 .  Turmeric POWD, by Does not apply route., Disp: , Rfl:   EXAM:   VITALS per patient if applicable: none reported  GENERAL: alert, oriented, appears well and in no acute distress, morbidly obese  HEENT: atraumatic, conjunttiva clear, no obvious abnormalities on inspection of external nose and ears  NECK: normal movements of the head and neck  LUNGS: on inspection no signs of respiratory distress, breathing rate appears normal, no obvious gross increased work of breathing, gasping or wheezing  CV: no obvious cyanosis  MS: moves all visible extremities without noticeable abnormality  PSYCH/NEURO: pleasant and cooperative, no obvious depression or anxiety, speech and thought processing grossly intact  ASSESSMENT AND PLAN:   Morbid obesity ([place]) -discussed healthy lifestyle to encourage weight reduction. -consider referral to healthy weight and wellness clinic  Vitamin D deficiency -Check levels at next CPE  Hypothyroidism, unspecified type -Check TSH at CPE.  -Pt advised that if TSH is off, will need referral to endocrine as I am unfamiliar with dosing of armour thyroid.  Essential hypertension -Well controlled per patient on labetalol and nifedipine.     I discussed the assessment and treatment plan with the patient. The patient was provided an opportunity to ask questions and all were answered. The patient agreed with the plan and demonstrated an understanding of the instructions.   The patient was advised to call back or seek an in-person evaluation if the symptoms worsen or if the condition fails to improve as anticipated.    Lelon Frohlich, MD  [facility] at [facility]

## 2019-01-01 ENCOUNTER — Encounter: Payer: Self-pay | Admitting: Internal Medicine

## 2019-01-01 ENCOUNTER — Other Ambulatory Visit: Payer: Self-pay | Admitting: Physical Medicine & Rehabilitation

## 2019-01-01 DIAGNOSIS — I10 Essential (primary) hypertension: Secondary | ICD-10-CM

## 2019-01-02 MED ORDER — LABETALOL HCL 100 MG PO TABS
100.0000 mg | ORAL_TABLET | Freq: Two times a day (BID) | ORAL | 1 refills | Status: DC
Start: 2019-01-02 — End: 2020-04-02

## 2019-01-02 MED ORDER — NIFEDIPINE ER 30 MG PO TB24: 30.0000 mg | ORAL_TABLET | Freq: Two times a day (BID) | ORAL | 1 refills | Status: DC

## 2019-02-07 ENCOUNTER — Telehealth: Payer: Self-pay

## 2019-02-07 NOTE — Telephone Encounter (Signed)
Pt calling; no one got back c her last Oct about clearance for pregnancy.  Pt needs email address to send [place] the form to fill out for clearance.  She saw [facility]; had u/s; counseling.  Annual scheduled 8/4.  [phone]  Adv there was no email for her to send form thru.  Fax number given.  Pt needs asap since it was supposed to have been done last Oct.

## 2019-02-27 ENCOUNTER — Ambulatory Visit (INDEPENDENT_AMBULATORY_CARE_PROVIDER_SITE_OTHER): Payer: Self-pay | Admitting: Obstetrics and Gynecology

## 2019-02-27 ENCOUNTER — Encounter: Payer: Self-pay | Admitting: Obstetrics and Gynecology

## 2019-02-27 ENCOUNTER — Other Ambulatory Visit: Payer: Self-pay

## 2019-02-27 VITALS — BP 138/74 | Ht 64.0 in | Wt 234.0 lb

## 2019-02-27 DIAGNOSIS — Z7189 Other specified counseling: Secondary | ICD-10-CM

## 2019-02-27 DIAGNOSIS — Z01419 Encounter for gynecological examination (general) (routine) without abnormal findings: Secondary | ICD-10-CM

## 2019-02-27 DIAGNOSIS — Z1331 Encounter for screening for depression: Secondary | ICD-10-CM

## 2019-02-27 DIAGNOSIS — Z1339 Encounter for screening examination for other mental health and behavioral disorders: Secondary | ICD-10-CM

## 2019-02-27 NOTE — Progress Notes (Signed)
Gynecology Annual Exam  PCP: Isaac Bliss, Rayford Halsted, MD  Chief Complaint  Patient presents with  . Annual Exam   History of Present Illness:  Ms. Cassie [name] is a 53 y.o. G1P0010 who LMP was Patient's last menstrual period was 02/25/2019., presents today for her annual examination.  Her menses are regular every 28-30 days, lasting 5 day(s).  Dysmenorrhea mild, occurring first 1-2 days of flow. She does not have intermenstrual bleeding.  She does not have vasomotor sx.   She is single partner, contraception - none. She does not have vaginal dryness.  Last Pap: unknown Hx of STDs: none  Last mammogram: 06/2018  Results were: normal--routine follow-up in 12 months There is no FH of breast cancer. There is a FH of ovarian cancer in her great-great grandmother. The patient does do self-breast exams.  Colonoscopy: never had DEXA: has not been screened for osteoporosis  Tobacco use: The patient denies current or previous tobacco use. Alcohol use: social drinker Exercise: moderately active  The patient wears seatbelts: yes.     Past Medical History:  Diagnosis Date  . Hypertension   . Hypothyroidism   . Metabolic syndrome     Past Surgical History:  Procedure Laterality Date  . DENTAL SURGERY      Prior to Admission medications   Medication Sig Start Date End Date Taking? Authorizing Provider  ergocalciferol (VITAMIN D2) 1.25 MG (50000 UT) capsule Take 50,000 Units by mouth once a week.   Yes [provider]  labetalol (NORMODYNE) 100 MG tablet Take 1 tablet (100 mg total) by mouth 2 (two) times daily. 01/02/19  Yes Isaac Bliss, Rayford Halsted, MD  metFORMIN (GLUCOPHAGE) 500 MG tablet Take by mouth 2 (two) times daily with a meal.   Yes [provider]  progesterone (PROMETRIUM) 100 MG capsule Take 100 mg by mouth daily.   Yes [provider]  thyroid (ARMOUR THYROID) 90 MG tablet Take 1 tablet (90 mg total) by mouth daily. 10/25/18  Yes Meredith Staggers, MD  Kava, Piper methysticum, (KAVA KAVA PO) Take by mouth.    [provider]  labetalol (NORMODYNE) 100 MG tablet TAKE 1 TABLET(100 MG) BY MOUTH TWICE DAILY Patient not taking: Reported on 02/27/2019 01/02/19   Isaac Bliss, Rayford Halsted, MD  NON FORMULARY     [provider]  [facility] FORMULARY     [provider]    Allergies  Allergen Reactions  . Amlodipine Swelling  . Sulfa Antibiotics    Obstetric History: G1P0010  Family History  Problem Relation Age of Onset  . Kidney disease Father   . Diabetes Father   . Breast cancer Neg Hx     Social History   Socioeconomic History  . Marital status: Significant Other    Spouse name: Not on file  . Number of children: Not on file  . Years of education: Not on file  . Highest education level: Not on file  Occupational History  . Not on file  Social Needs  . Financial resource strain: Not on file  . Food insecurity    Worry: Not on file    Inability: Not on file  . Transportation needs    Medical: Not on file    Non-medical: Not on file  Tobacco Use  . Smoking status: Never Smoker  . Smokeless tobacco: Never Used  Substance and Sexual Activity  . Alcohol use: No    Frequency: Never  . Drug use: No  . Sexual  activity: Yes    Birth control/protection: None  Lifestyle  . Physical activity    Days per week: Not on file    Minutes per session: Not on file  . Stress: Not on file  Relationships  . Social Herbalist on phone: Not on file    Gets together: Not on file    Attends religious service: Not on file    Active member of club or organization: Not on file    Attends meetings of clubs or organizations: Not on file    Relationship status: Not on file  . Intimate partner violence    Fear of current or ex partner: Not on file    Emotionally abused: Not on file    Physically abused: Not on file    Forced sexual activity: Not on file  Other Topics Concern  . Not on file   Social History Narrative  . Not on file    Review of Systems  Constitutional: Negative.   HENT: Negative.   Eyes: Negative.   Respiratory: Negative.   Cardiovascular: Negative.   Gastrointestinal: Negative.   Genitourinary: Negative.   Musculoskeletal: Negative.   Skin: Negative.   Neurological: Negative.   Psychiatric/Behavioral: Negative.      Physical Exam BP 138/74   Ht 5\' 4"  (1.626 m)   Wt 234 lb (106.1 kg)   LMP 02/25/2019   BMI 40.17 kg/m   Physical Exam Constitutional:      General: She is not in acute distress.    Appearance: Normal appearance. She is well-developed.  Genitourinary:     Pelvic exam was performed with patient in the lithotomy position.     Vulva, urethra, bladder and uterus normal.     No inguinal adenopathy present in the right or left side.    No signs of injury in the vagina.     No vaginal discharge, erythema, tenderness or bleeding.     Cervical bleeding (from external os (patient is menstruating)) present.     No cervical motion tenderness, discharge, lesion or polyp.     Uterus is mobile.     Uterus is not enlarged or tender.     No uterine mass detected.    Uterus is anteverted.     No right or left adnexal mass present.     Right adnexa not tender or full.     Left adnexa not tender or full.     Genitourinary Comments: Bimanual exam is limited by patient's body habitus  HENT:     Head: Normocephalic and atraumatic.  Eyes:     General: No scleral icterus.    Conjunctiva/sclera: Conjunctivae normal.  Neck:     Musculoskeletal: Normal range of motion and neck supple.     Thyroid: No thyromegaly.  Cardiovascular:     Rate and Rhythm: Normal rate and regular rhythm.     Heart sounds: No murmur. No friction rub. No gallop.   Pulmonary:     Effort: Pulmonary effort is normal. No respiratory distress.     Breath sounds: Normal breath sounds. No wheezing or rales.  Chest:     Breasts:        Right: No swelling, bleeding, inverted  nipple, mass, nipple discharge, skin change or tenderness.        Left: No swelling, bleeding, inverted nipple, mass, nipple discharge, skin change or tenderness.  Abdominal:     General: Bowel sounds are normal. There is no distension.  Palpations: Abdomen is soft. There is no mass.     Tenderness: There is no abdominal tenderness. There is no guarding or rebound.  Musculoskeletal: Normal range of motion.        General: No swelling or tenderness.  Lymphadenopathy:     Cervical: No cervical adenopathy.     Lower Body: No right inguinal adenopathy. No left inguinal adenopathy.  Neurological:     General: No focal deficit present.     Mental Status: She is alert and oriented to person, place, and time.     Cranial Nerves: No cranial nerve deficit.  Skin:    General: Skin is warm and dry.     Findings: No erythema or rash.  Psychiatric:        Mood and Affect: Mood normal.        Behavior: Behavior normal.        Judgment: Judgment normal.     Female chaperone present for pelvic and breast  portions of the physical exam  Results: AUDIT Questionnaire (screen for alcoholism): 1 PHQ-9: 0  Assessment: 53 y.o. G28P0010 female here for routine gynecologic examination.  Plan: Problem List Items Addressed This Visit    None    Visit Diagnoses    Women's annual routine gynecological examination    -  Primary   Screening for depression       Screening for alcoholism       Counseling and coordination of care          Screening: -- Blood pressure screen managed by PCP -- Colonoscopy - not due -- Mammogram - not due -- Weight screening: obese: discussed management options, including lifestyle, dietary, and exercise. -- Depression screening negative (PHQ-9) -- Nutrition: normal -- cholesterol screening: per PCP -- osteoporosis screening: not due -- tobacco screening: not using -- alcohol screening: AUDIT questionnaire indicates low-risk usage. -- family history of breast  cancer screening: done. not at high risk. -- no evidence of domestic violence or intimate partner violence. -- STD screening: gonorrhea/chlamydia NAAT not collected per patient request. -- pap smear not collected today as the patient was menstruating and sample would be unlikely to yield diagnosis per ASCCP guidelines -- HPV vaccination series: not eligilbe  Counseling and coordination of care: Patient continues to work with fertility clinic in [place].  Today she brings forms for pregnancy clearance from an obstetrical standpoint.  She has been cleared from a cardiology and general medicine standpoint.  We spent at least 25 minutes discussing pregnancy related risks due to maternal age, hypertension, obesity, and her history of illness.  About 1.5 years ago she was hospitalized with pneumonia and was an inpatient for 1 month.  During this time she was intubated and had a tracheostomy.  She states that currently she is working with an Oncologist and is increasing her exercise activity without any physical limitations.  During our discussion I discouraged her from pregnancy, given her comorbid conditions.  However, strictly speaking, she has no absolute contraindications to pregnancy.  Reluctantly, I signed her form after having given the above precautions.  Additionally, we discussed the risks of the pregnancy to the fetus.  Her plan currently is to have donor eggs utilizing sperm from her current partner.  She plans on having an embryo transfer and carrying the pregnancy herself.  We discussed that with her cardiologist stating that she has mild left ventricular hypertrophy she is at greater risk for cardiomyopathy, death, and need for high-level monitoring during her  pregnancy to monitor and prevent, and possibly treat any comorbid conditions which arise as relates to her pregnancy.  The risk to the fetus would be related to her comorbid conditions.  She would be at increased risk of preeclampsia,  cardiomyopathy, early pregnancy loss, early delivery due to either pregnancy complications or maternal indications with possible resultant severe morbidity and possibly mortality to the fetus.  We discussed multiple other issues as relates to her pregnancy.  She voiced an understanding of these issues and very strong desire to proceed.  With all this understanding and mind, I signed her clearance form and will fax it to the fertility clinic in [place].  We also discussed that should she become pregnant, she may require either primary management or comanagement by a maternal-fetal medicine specialist, along with potentially other obstetric related management.  Again, she voiced understanding and strong desire to become pregnant.  Prentice Docker, MD 03/03/2019 2:01 PM

## 2019-02-27 NOTE — Progress Notes (Deleted)
Routine Annual Gynecology Examination   PCP: Isaac Bliss, Rayford Halsted, MD  Chief Complaint  Patient presents with  . Annual Exam    History of Present Illness: Patient is a 53 y.o. G1P0010 presents for annual exam. The patient has no complaints today.   Menopausal bleeding: {Blank single:19197::"reports","denies"}  Menopausal symptoms: {Blank single:19197::"reports","denies"}  Breast symptoms: {Blank single:19197::"reports","denies"}  Last pap smear: {NUMBER 1-10:22536} years ago.  Result {Desc; normal/abnormal:11317::"Normal"}  Last mammogram: {NUMBER 1-10:22536} years ago.  Result {Desc; normal/abnormal:11317::"Normal"}  Past Medical History:  Diagnosis Date  . Hypertension   . Hypothyroidism   . Metabolic syndrome     Past Surgical History:  Procedure Laterality Date  . DENTAL SURGERY      Prior to Admission medications   Medication Sig Start Date End Date Taking? Authorizing Provider  ergocalciferol (VITAMIN D2) 1.25 MG (50000 UT) capsule Take 50,000 Units by mouth once a week.   Yes [provider]  labetalol (NORMODYNE) 100 MG tablet Take 1 tablet (100 mg total) by mouth 2 (two) times daily. 01/02/19  Yes Isaac Bliss, Rayford Halsted, MD  metFORMIN (GLUCOPHAGE) 500 MG tablet Take by mouth 2 (two) times daily with a meal.   Yes [provider]  progesterone (PROMETRIUM) 100 MG capsule Take 100 mg by mouth daily.   Yes [provider]  thyroid (ARMOUR THYROID) 90 MG tablet Take 1 tablet (90 mg total) by mouth daily. 10/25/18  Yes Meredith Staggers, MD  Kava, Piper methysticum, (KAVA KAVA PO) Take by mouth.    [provider]  labetalol (NORMODYNE) 100 MG tablet TAKE 1 TABLET(100 MG) BY MOUTH TWICE DAILY Patient not taking: Reported on 02/27/2019 01/02/19   Isaac Bliss, Rayford Halsted, MD  NON FORMULARY     [provider]  [facility] FORMULARY     [provider]    Allergies  Allergen Reactions  . Amlodipine Swelling  .  Sulfa Antibiotics     Gynecologic History:  Patient's last menstrual period was 02/25/2019. Contraception: {method:5051} Last Pap: ***. Results were: {norm/abn:16337} Last mammogram: ***. Results were: {norm/abn:16337}  Obstetric History: G1P0010  Social History   Socioeconomic History  . Marital status: Significant Other    Spouse name: Not on file  . Number of children: Not on file  . Years of education: Not on file  . Highest education level: Not on file  Occupational History  . Not on file  Social Needs  . Financial resource strain: Not on file  . Food insecurity    Worry: Not on file    Inability: Not on file  . Transportation needs    Medical: Not on file    Non-medical: Not on file  Tobacco Use  . Smoking status: Never Smoker  . Smokeless tobacco: Never Used  Substance and Sexual Activity  . Alcohol use: No    Frequency: Never  . Drug use: No  . Sexual activity: Yes    Birth control/protection: None  Lifestyle  . Physical activity    Days per week: Not on file    Minutes per session: Not on file  . Stress: Not on file  Relationships  . Social Herbalist on phone: Not on file    Gets together: Not on file    Attends religious service: Not on file    Active member of club or organization: Not on file    Attends meetings of clubs or organizations: Not on file    Relationship status:  Not on file  . Intimate partner violence    Fear of current or ex partner: Not on file    Emotionally abused: Not on file    Physically abused: Not on file    Forced sexual activity: Not on file  Other Topics Concern  . Not on file  Social History Narrative  . Not on file    Family History  Problem Relation Age of Onset  . Kidney disease Father   . Diabetes Father   . Breast cancer Neg Hx     ROS   Physical Exam Vitals: BP 138/74   Ht 5\' 4"  (1.626 m)   Wt 234 lb (106.1 kg)   LMP 02/25/2019   BMI 40.17 kg/m   OBGyn Exam   Female chaperone present  for pelvic and breast  portions of the physical exam  Results: AUDIT Questionnaire (screen for alcoholism): *** PHQ-9: ***   Assessment and Plan:  53 y.o. G80P0010 female here for routine annual gynecologic examination  Plan: Problem List Items Addressed This Visit    None      Screening: -- Blood pressure screen {Blank single:19197::"normal","elevated: continued to monitor.","managed by PCP"} -- Colonoscopy - {Blank single:19197::"due - will schedule","due - managed by PCP","not due"} -- Mammogram - {Blank single:19197::"due. Patient to call [place] to arrange. She understands that it is her responsibility to arrange this.","not due","due - already scheduled at [facility] on ***"} -- Weight screening: {Blank single:19197::"obese: discussed management options, including lifestyle, dietary, and exercise.","overweight: continue to monitor","normal"} -- Depression screening negative (PHQ-9) -- Nutrition: normal -- cholesterol screening: {Blank single:19197::"will obtain","per PCP","not due for screening"} -- osteoporosis screening: {Blank single:19197::"not due","due - will order DEXA"} -- tobacco screening: {Blank single:19197::"using: discussed quitting using the 5 A's","not using"} -- alcohol screening: AUDIT questionnaire indicates low-risk usage. -- family history of breast cancer screening: done. not at high risk. -- no evidence of domestic violence or intimate partner violence. -- STD screening: gonorrhea/chlamydia NAAT {Blank single:19197::"not collected per patient request.","collected"} -- pap smear {Blank single:19197::"collected","not collected"} per ASCCP guidelines -- flu vaccine {Blank single:19197} -- HPV vaccination series: {Blank single:19197::"received","has not received - will orderd","has not received - pt refuses","not eligilbe"}   Prentice Docker, MD 02/27/2019 4:25 PM

## 2019-03-03 ENCOUNTER — Encounter: Payer: Self-pay | Admitting: Obstetrics and Gynecology

## 2019-07-12 ENCOUNTER — Encounter: Payer: Self-pay | Admitting: Internal Medicine

## 2019-07-12 NOTE — Telephone Encounter (Signed)
Okay to refill? 

## 2019-09-04 ENCOUNTER — Telehealth: Payer: Self-pay | Admitting: Obstetrics and Gynecology

## 2019-09-04 NOTE — Telephone Encounter (Signed)
Patient also reports having a frozen embryo transfer on 07/18/19 and miscarried. Please advise

## 2019-09-04 NOTE — Telephone Encounter (Signed)
Patient is calling to see if we could schedule her an ultrasound appointment following her infertility. Patient is seeing [facility] needing a saline ultrasound please advise if we can offer her this service. Patient is needing to have this done by Friday. Please advise

## 2019-09-05 NOTE — Telephone Encounter (Signed)
We can perform this service, if we have one of the MDs available to do the saline infusion sonohysterogram and there is an opening on the ultrasound schedule.

## 2019-09-07 NOTE — Telephone Encounter (Signed)
Per [facility] unable to perform service at this time patient would need to follow up with Dr. Glennon Mac. Dr. Glennon Mac isn't in office today to perform service.Called and left voicemail for patient to call back so we can inform her Dr. Glennon Mac would need to follow patient but is not in office.

## 2019-09-13 ENCOUNTER — Other Ambulatory Visit: Payer: Self-pay | Admitting: Obstetrics and Gynecology

## 2019-09-13 DIAGNOSIS — R928 Other abnormal and inconclusive findings on diagnostic imaging of breast: Secondary | ICD-10-CM

## 2019-09-16 IMAGING — CR DG CHEST 2V
2 series · 2 of 2 positions shown · non-contrast
Comparison: None.

CLINICAL DATA: 51-year-old female with a history of pneumonia.
Productive cough.

EXAM:
CHEST  2 VIEW

[chest lat]
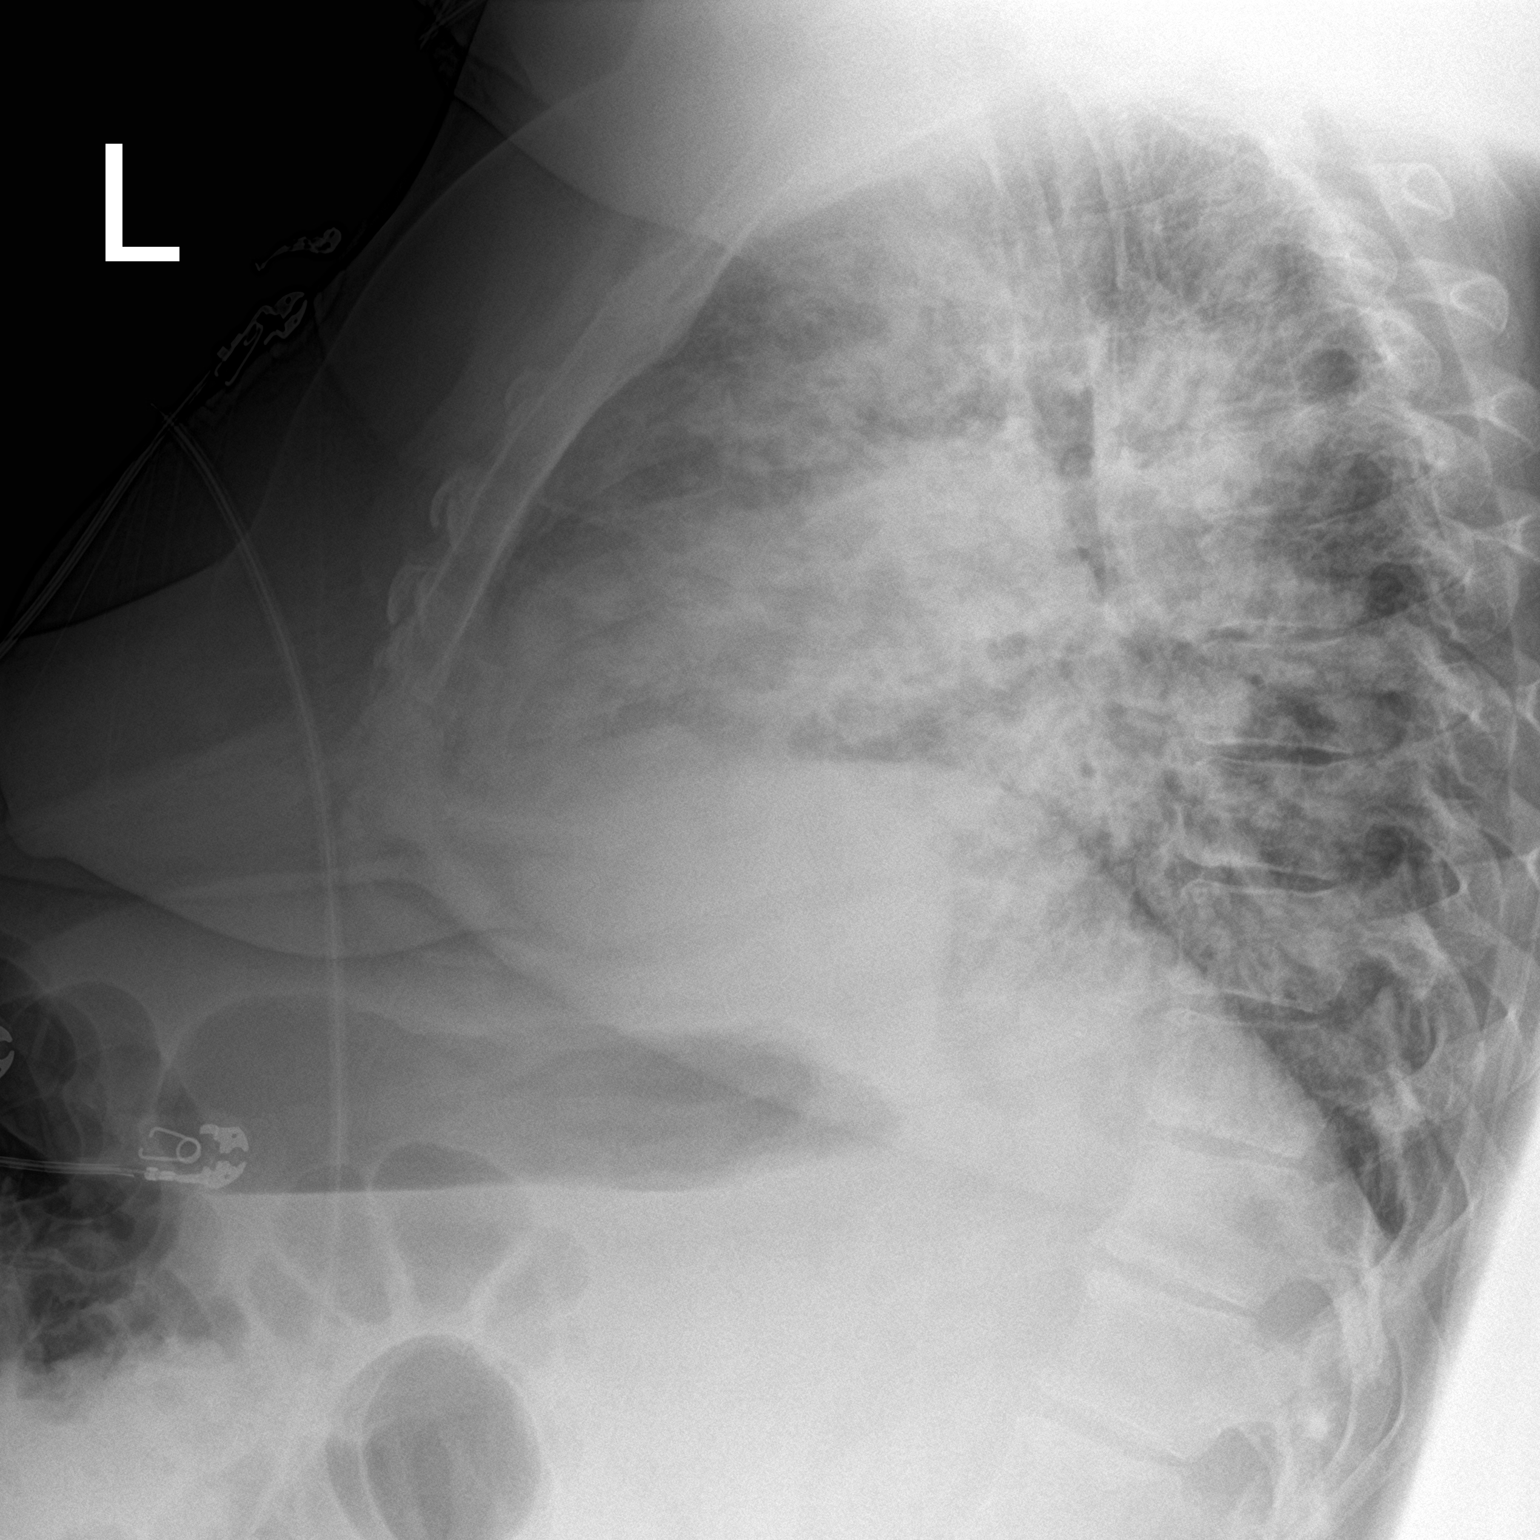

[chest ap]
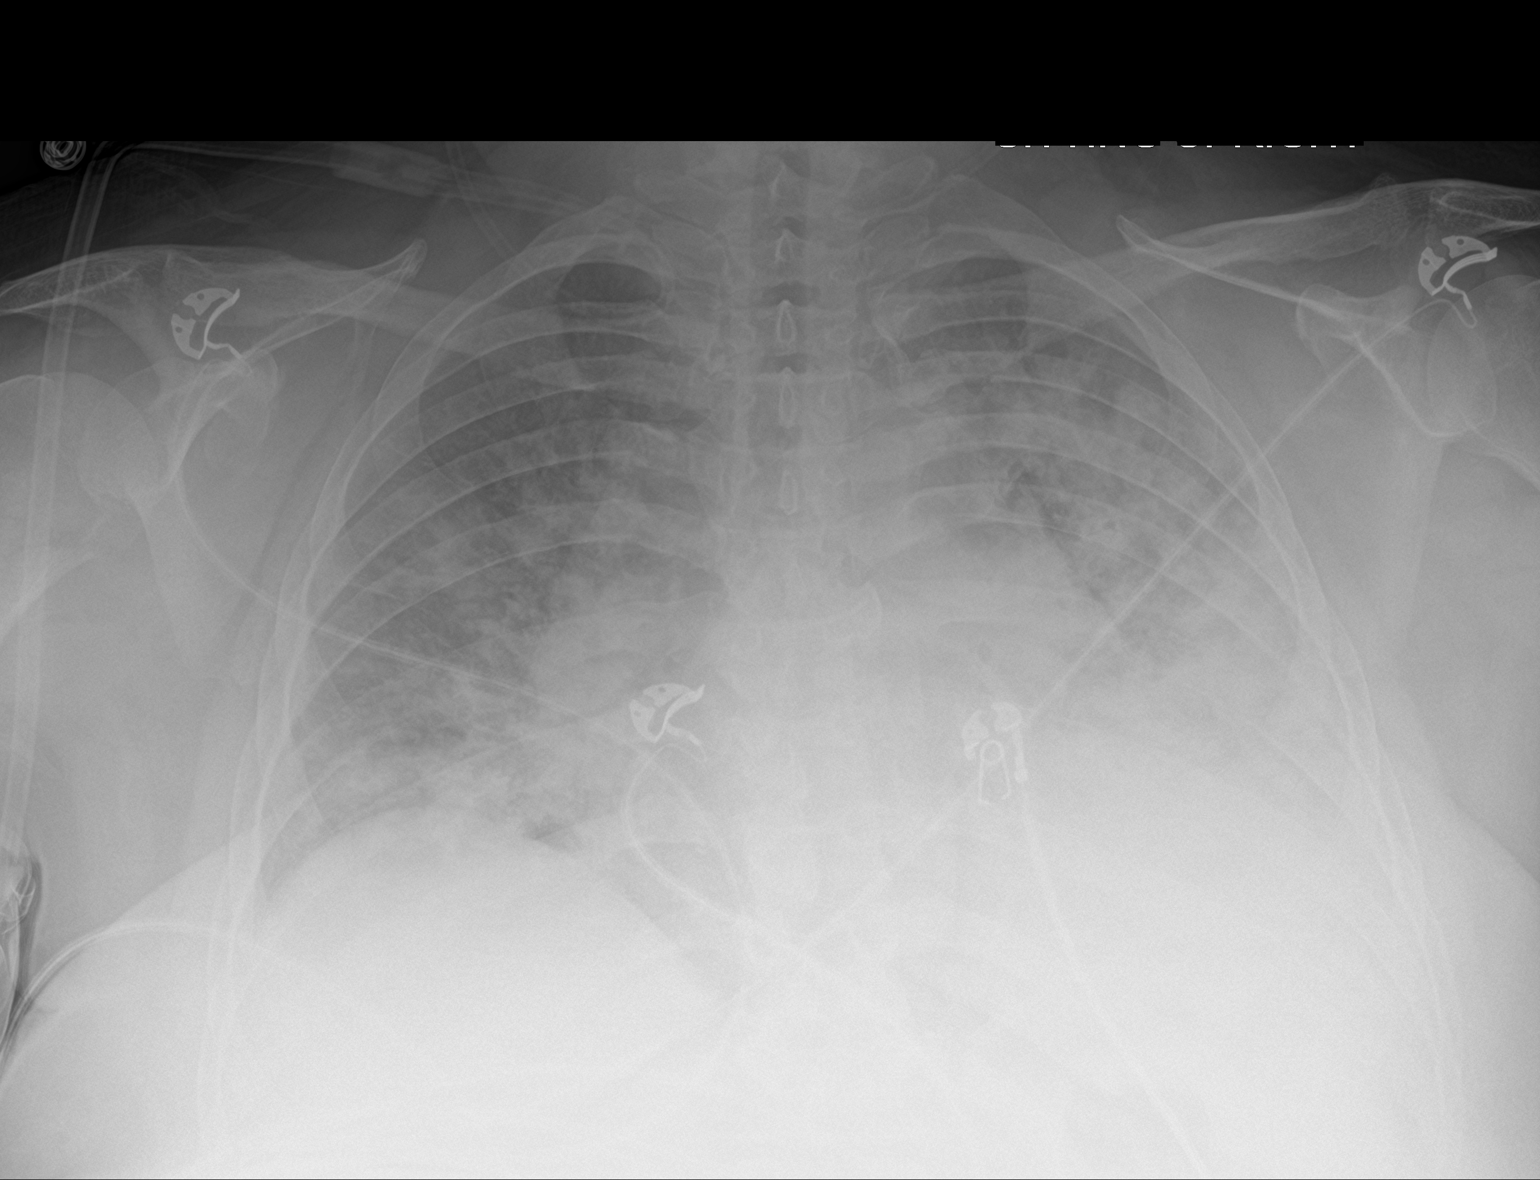

[2 of 2 positions shown; findings below may reference images not displayed]

FINDINGS: Cardiomediastinal silhouette obscured by overlying lung/pleural
disease.

Low lung volumes with patchy and linear opacity of the bilateral
lungs, left greater than right.

No pleural effusion.  No pneumothorax.
IMPRESSION: Mixed airspace and interstitial opacities of the bilateral lungs,
left greater than right. Given the history, findings most likely
represent multifocal pneumonia, however, pulmonary edema could have
this appearance.

## 2019-09-17 IMAGING — DX DG CHEST 1V PORT
1 series · 1 of 1 positions shown · non-contrast
Comparison: 08/17/2017.

CLINICAL DATA: Respiratory failure.

EXAM:
PORTABLE CHEST 1 VIEW

[chest ap]
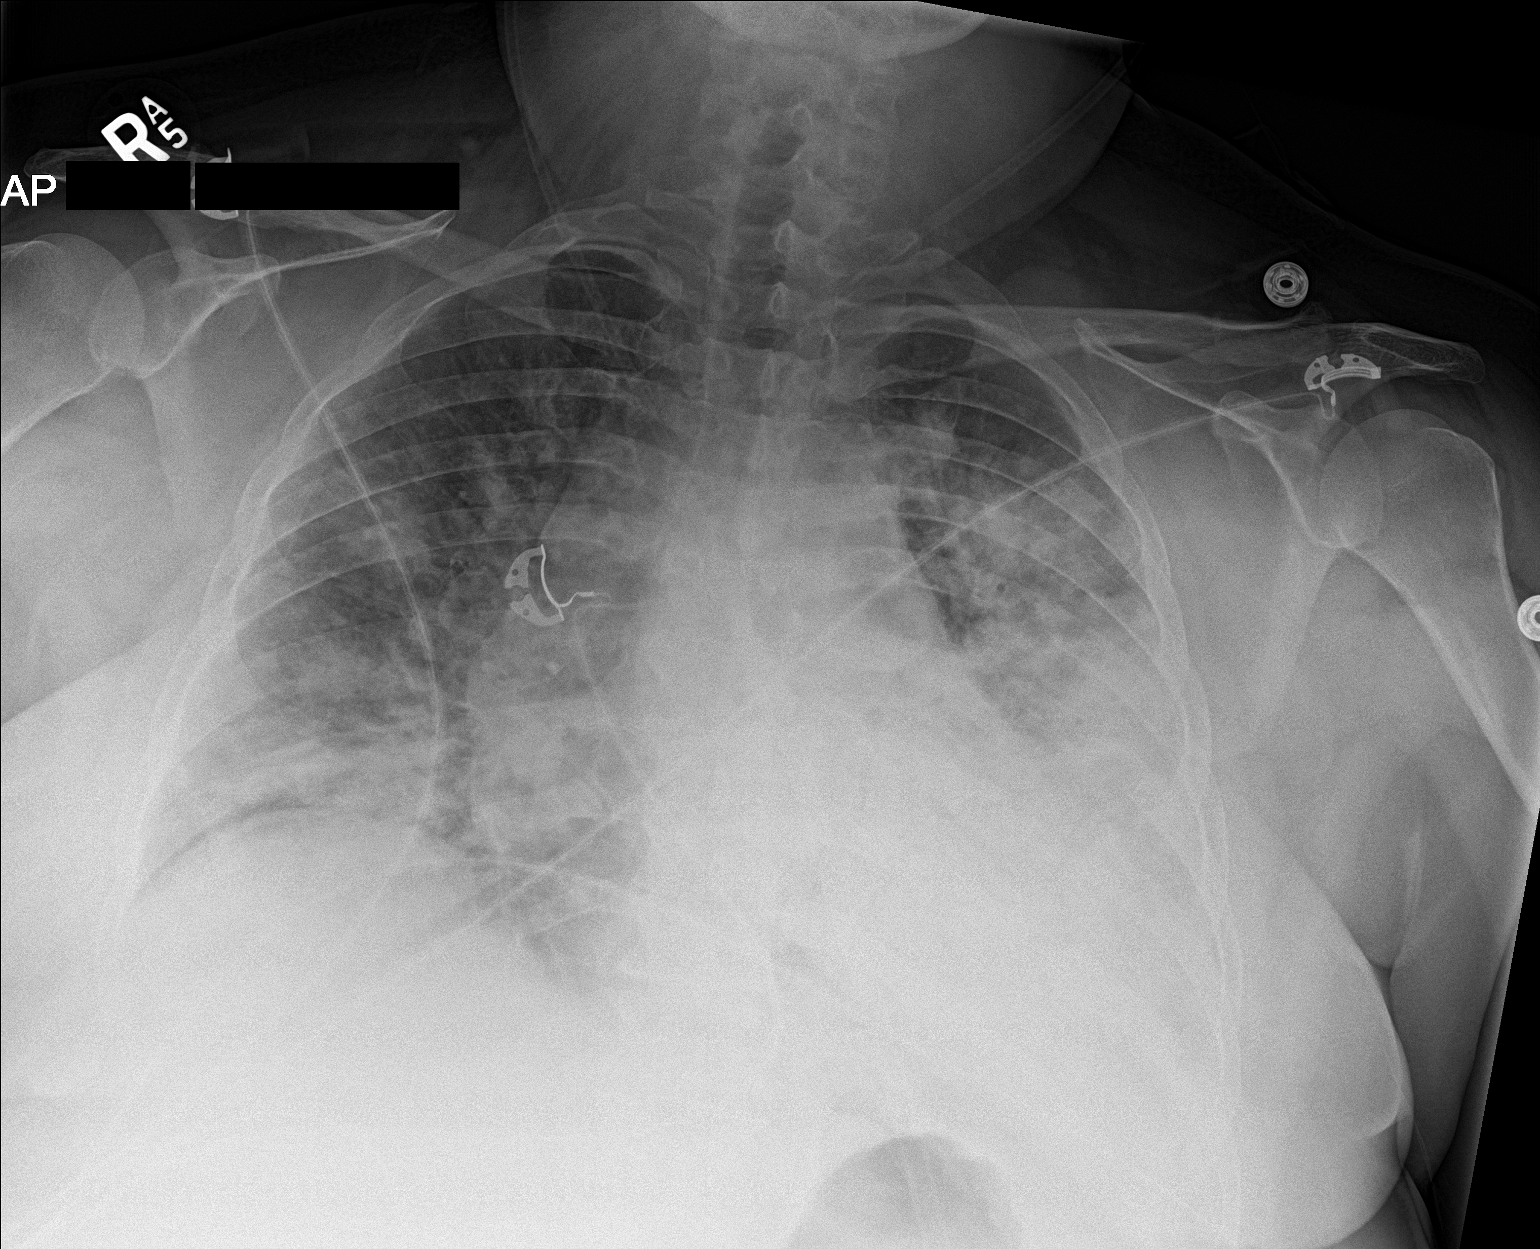

[1 of 1 positions shown; findings below may reference images not displayed]

FINDINGS: Cardiomegaly again noted. Diffuse bilateral pulmonary infiltrates
are again noted. These infiltrates could represent bilateral
pneumonia and/or pulmonary edema. No interim improvement. Small left
pleural effusion cannot be excluded. No pneumothorax.
IMPRESSION: Cardiomegaly again noted. Diffuse bilateral pulmonary infiltrates
are again noted. These could represent changes of bilateral
pneumonia and/or pulmonary edema. No interim change. Small left
pleural effusion cannot be excluded.

## 2019-09-18 ENCOUNTER — Encounter: Payer: Self-pay | Admitting: Internal Medicine

## 2019-09-19 IMAGING — DX DG CHEST 1V PORT
1 series · 1 of 1 positions shown · non-contrast
Comparison: August 19, 2017

CLINICAL DATA: Patient on ventilator.

EXAM:
PORTABLE CHEST 1 VIEW

[chest ap]
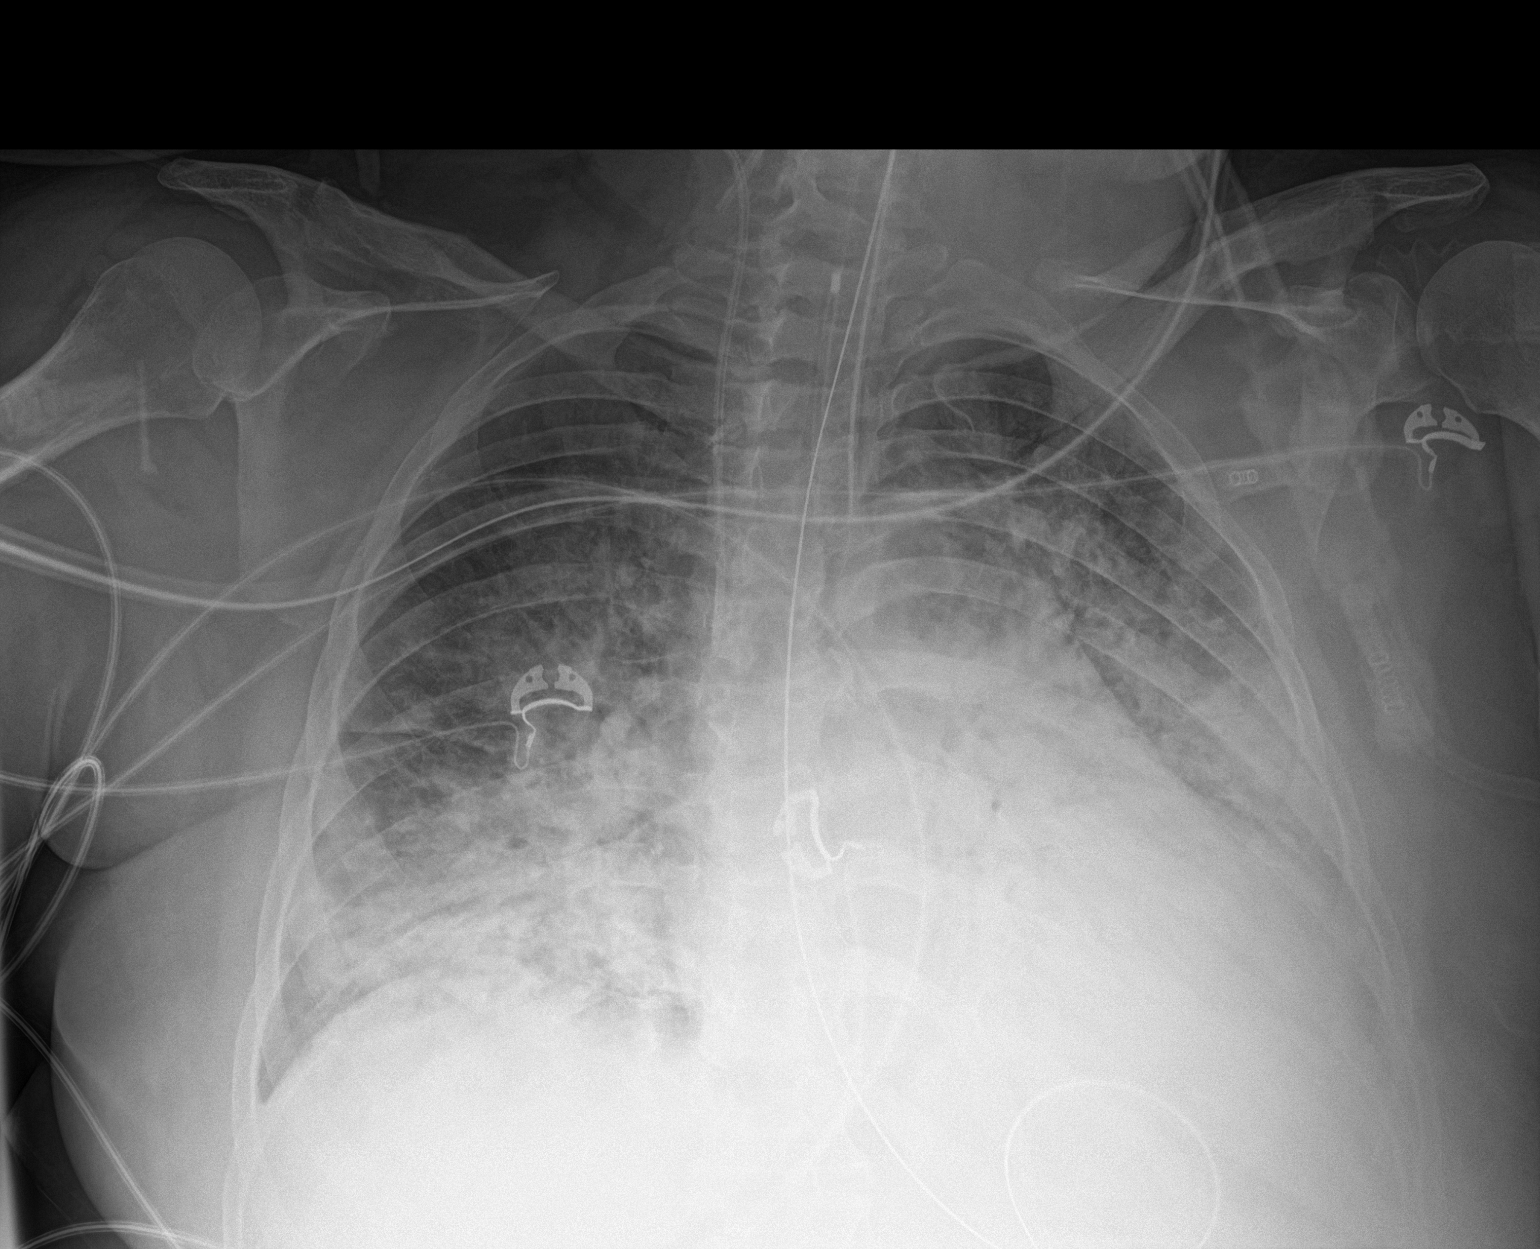

[1 of 1 positions shown; findings below may reference images not displayed]

FINDINGS: The ETT is in good position. The NG tube terminates below today's
film. The right IJ is stable. No pneumothorax. Bilateral pulmonary
infiltrates remain with slight interval improvement. No other
changes.
IMPRESSION: 1. Stable support apparatus.
2. Bilateral pulmonary infiltrates remain with minimal interval
improvement.

## 2019-09-20 IMAGING — DX DG CHEST 1V PORT
1 series · 1 of 1 positions shown · non-contrast
Comparison: [DATE]

CLINICAL DATA: Acute respiratory failure

EXAM:
PORTABLE CHEST 1 VIEW

[chest ap]
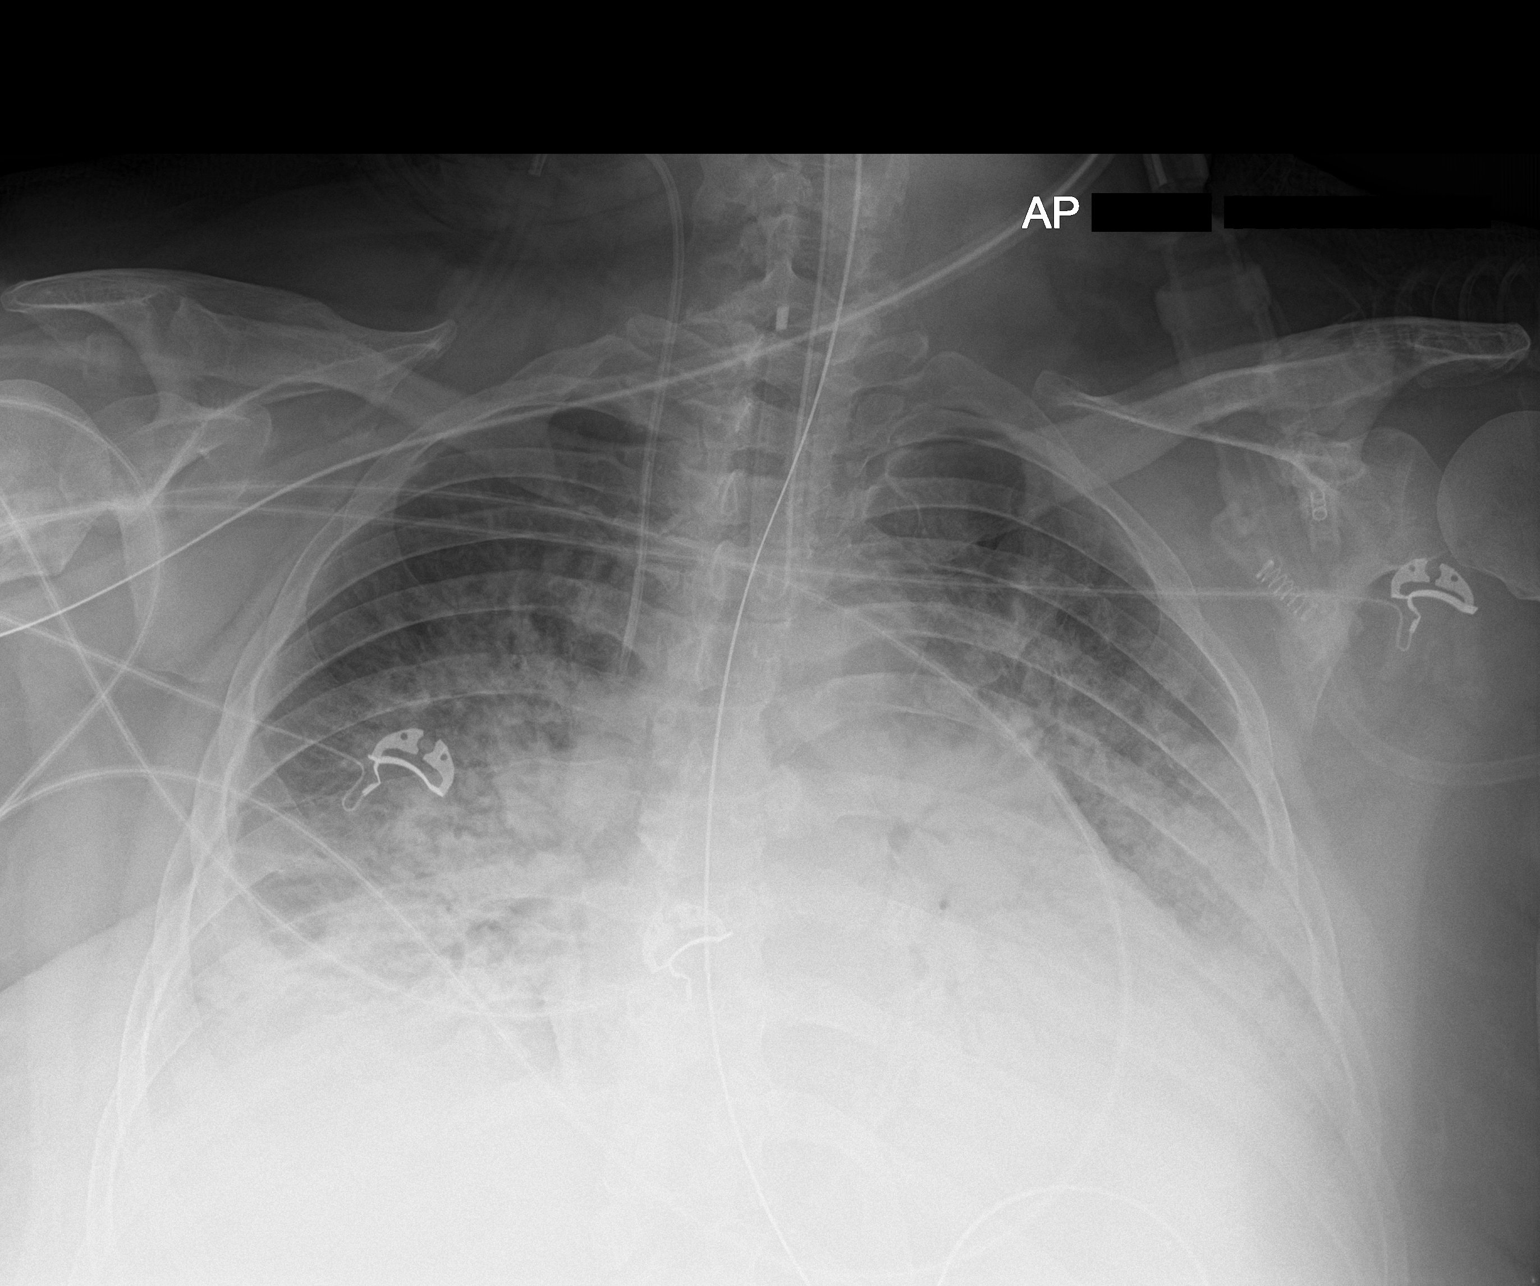

[1 of 1 positions shown; findings below may reference images not displayed]

FINDINGS: Endotracheal tube, nasogastric catheter and right jugular central
line are again seen and stable. Cardiac shadow is enlarged but
stable. Diffuse bilateral infiltrates are again identified unchanged
from the prior exam. No pneumothorax is seen.
IMPRESSION: No change from the previous day.

## 2019-09-26 ENCOUNTER — Encounter: Payer: Self-pay | Admitting: Internal Medicine

## 2019-09-27 ENCOUNTER — Encounter: Payer: Self-pay | Admitting: Family Medicine

## 2019-09-27 ENCOUNTER — Telehealth: Payer: BC Managed Care – PPO | Admitting: Family Medicine

## 2019-09-27 ENCOUNTER — Other Ambulatory Visit: Payer: Self-pay

## 2019-09-27 VITALS — BP 170/100

## 2019-09-27 DIAGNOSIS — Z538 Procedure and treatment not carried out for other reasons: Secondary | ICD-10-CM

## 2019-09-27 NOTE — Progress Notes (Signed)
Patient requesting a telemedicine visit for her blood pressure. However, she is not currently located in [place]. I only have medical license to practice in [place], so I advised that she see a doctor in her state or utilize a national telemedicine platform and provider her with several options. She agreed to do so and thanks me for the information. No visit was performed or charged.

## 2019-09-27 NOTE — Progress Notes (Signed)
Called pt multiple times at her appointment time and after with no answer, LM for her to contact our office and we could work her in later today if she still needs to be seen. Also, let staff know.

## 2019-10-01 ENCOUNTER — Other Ambulatory Visit: Payer: Self-pay

## 2019-10-15 ENCOUNTER — Other Ambulatory Visit: Payer: Self-pay

## 2019-10-15 ENCOUNTER — Other Ambulatory Visit: Payer: Self-pay | Admitting: Obstetrics and Gynecology

## 2019-10-15 DIAGNOSIS — R928 Other abnormal and inconclusive findings on diagnostic imaging of breast: Secondary | ICD-10-CM

## 2019-11-06 ENCOUNTER — Other Ambulatory Visit: Payer: Self-pay | Admitting: Obstetrics and Gynecology

## 2019-11-06 ENCOUNTER — Other Ambulatory Visit: Payer: Self-pay

## 2019-11-06 ENCOUNTER — Ambulatory Visit
Admission: RE | Admit: 2019-11-06 | Discharge: 2019-11-06 | Disposition: A | Discharge status: Home / Self Care | Payer: BC Managed Care – PPO | Source: Ambulatory Visit | Attending: Obstetrics and Gynecology | Admitting: Obstetrics and Gynecology

## 2019-11-06 DIAGNOSIS — R928 Other abnormal and inconclusive findings on diagnostic imaging of breast: Secondary | ICD-10-CM

## 2019-11-28 ENCOUNTER — Other Ambulatory Visit: Payer: Self-pay | Admitting: Internal Medicine

## 2019-11-28 DIAGNOSIS — I10 Essential (primary) hypertension: Secondary | ICD-10-CM

## 2019-12-30 ENCOUNTER — Other Ambulatory Visit: Payer: Self-pay | Admitting: Physical Medicine & Rehabilitation

## 2019-12-30 ENCOUNTER — Other Ambulatory Visit: Payer: Self-pay | Admitting: Internal Medicine

## 2019-12-30 DIAGNOSIS — I10 Essential (primary) hypertension: Secondary | ICD-10-CM

## 2019-12-31 ENCOUNTER — Encounter: Payer: Self-pay | Admitting: Internal Medicine

## 2020-01-01 ENCOUNTER — Telehealth: Payer: Self-pay | Admitting: Internal Medicine

## 2020-01-01 NOTE — Telephone Encounter (Signed)
Then she will have to contact that telehealth doctor for refills. I cannot refill meds since I haven't seen her in >1 year and never in person.

## 2020-01-01 NOTE — Telephone Encounter (Signed)
Pt called to check on her prescription and I informed her of Dr. Jerilee Hoh message below. Pt is upset over the information and states that her Telehealth Doctor is located in [place]. She feels like she has been placed in a crappy situation and asked what to do. Informed pt to call her telehealth doctor and if they are unable to assist her with the medication then give [place] a call back. Pt said fine and hung up.

## 2020-01-01 NOTE — Telephone Encounter (Signed)
Left a detailed message at the pts cell number with the information below. 

## 2020-01-01 NOTE — Telephone Encounter (Signed)
Patient can't get in for an appt until the end of June.  She has one scheduled- she can only come in the afternoon.  She states a telemed dr told her when she was in [place] to increase her Labetolol to 2 pills, however she states this is too much so she has been taking 1 1/2 pills.  Patient needs a refill on the medication for this dose.  Pt is out of medication.  Refill also needed for Thyroid NP 90 mgs  Phaarmacy- [facility] on [address]

## 2020-01-02 ENCOUNTER — Ambulatory Visit: Payer: BC Managed Care – PPO | Admitting: Internal Medicine

## 2020-01-04 ENCOUNTER — Encounter: Payer: Self-pay | Admitting: Internal Medicine

## 2020-01-22 ENCOUNTER — Ambulatory Visit: Payer: BC Managed Care – PPO | Admitting: Internal Medicine

## 2020-02-27 ENCOUNTER — Ambulatory Visit (INDEPENDENT_AMBULATORY_CARE_PROVIDER_SITE_OTHER): Payer: BC Managed Care – PPO | Admitting: Family Medicine

## 2020-02-27 ENCOUNTER — Other Ambulatory Visit: Payer: Self-pay

## 2020-02-27 ENCOUNTER — Ambulatory Visit
Admission: RE | Admit: 2020-02-27 | Discharge: 2020-02-27 | Disposition: A | Discharge status: Home / Self Care | Payer: BC Managed Care – PPO | Source: Ambulatory Visit | Attending: Family Medicine | Admitting: Family Medicine

## 2020-02-27 VITALS — BP 179/99 | Ht 64.0 in | Wt 230.0 lb

## 2020-02-27 DIAGNOSIS — G8929 Other chronic pain: Secondary | ICD-10-CM | POA: Diagnosis not present

## 2020-02-27 DIAGNOSIS — M545 Low back pain, unspecified: Secondary | ICD-10-CM

## 2020-02-27 MED ORDER — METHYLPREDNISOLONE ACETATE 80 MG/ML IJ SUSP
80.0000 mg | Freq: Once | INTRAMUSCULAR | Status: AC
  Administered 2020-02-27: 80 mg via INTRAMUSCULAR

## 2020-02-27 MED ORDER — KETOROLAC TROMETHAMINE 60 MG/2ML IM SOLN
60.0000 mg | Freq: Once | INTRAMUSCULAR | Status: AC
  Administered 2020-02-27: 60 mg via INTRAMUSCULAR

## 2020-02-27 NOTE — Progress Notes (Signed)
PCP: Aretta Nip, MD  Subjective:   HPI: Patient is a 54 y.o. female here for mid/low back pain. She states she has had the pain for around 2 years now, on and off. The pain became worse over the past few months, to the point where she takes 4 ibuprofen 4 times a day. She is also seeing a chiropractor and getting acupuncture and laser therapy, which helps temporarily with the pain. The pain is worse with coughing, sneezing, prolonged walking or standing, and certain movements such as sitting down on her bed. The pain is localized and she denies any pain radiating down her legs. Denies pain with bending forward or back, numbness or tingling in her back or legs, or red flag symptoms such as fever, recent infection, incontinence, and weight loss.   Past Medical History:  Diagnosis Date  . Hypertension   . Hypothyroidism   . Metabolic syndrome     Current Outpatient Medications on File Prior to Visit  Medication Sig Dispense Refill  . Kava, Piper methysticum, (KAVA KAVA PO) Take by mouth.    . metFORMIN (GLUCOPHAGE) 500 MG tablet Take by mouth 2 (two) times daily with a meal.    . NP THYROID 90 MG tablet TAKE 1 TABLET(90 MG) BY MOUTH DAILY 90 tablet 5  . ergocalciferol (VITAMIN D2) 1.25 MG (50000 UT) capsule Take 50,000 Units by mouth once a week.    . labetalol (NORMODYNE) 100 MG tablet Take 1 tablet (100 mg total) by mouth 2 (two) times daily. 180 tablet 1  . labetalol (NORMODYNE) 100 MG tablet TAKE 1 TABLET BY MOUTH TWICE DAILY 60 tablet 0  . NON FORMULARY     . NON FORMULARY     . progesterone (PROMETRIUM) 100 MG capsule Take 100 mg by mouth daily.     No current facility-administered medications on file prior to visit.    Past Surgical History:  Procedure Laterality Date  . DENTAL SURGERY      Allergies  Allergen Reactions  . Amlodipine Swelling  . Sulfa Antibiotics     Social History   Socioeconomic History  . Marital status: Significant Other    Spouse name: Not  on file  . Number of children: Not on file  . Years of education: Not on file  . Highest education level: Not on file  Occupational History  . Not on file  Tobacco Use  . Smoking status: Never Smoker  . Smokeless tobacco: Never Used  Vaping Use  . Vaping Use: Never used  Substance and Sexual Activity  . Alcohol use: No  . Drug use: No  . Sexual activity: Yes    Birth control/protection: None  Other Topics Concern  . Not on file  Social History Narrative  . Not on file   Social Determinants of Health   Financial Resource Strain:   . Difficulty of Paying Living Expenses:   Food Insecurity:   . Worried About Charity fundraiser in the Last Year:   . Arboriculturist in the Last Year:   Transportation Needs:   . Film/video editor (Medical):   Marland Kitchen Lack of Transportation (Non-Medical):   Physical Activity:   . Days of Exercise per Week:   . Minutes of Exercise per Session:   Stress:   . Feeling of Stress :   Social Connections:   . Frequency of Communication with Friends and Family:   . Frequency of Social Gatherings with Friends and Family:   .  Attends Religious Services:   . Active Member of Clubs or Organizations:   . Attends Archivist Meetings:   Marland Kitchen Marital Status:   Intimate Partner Violence:   . Fear of Current or Ex-Partner:   . Emotionally Abused:   Marland Kitchen Physically Abused:   . Sexually Abused:     Family History  Problem Relation Age of Onset  . Kidney disease Father   . Diabetes Father   . Breast cancer Neg Hx     BP (!) 179/99   Ht 5\' 4"  (1.626 m)   Wt 230 lb (104.3 kg)   LMP 02/24/2020   BMI 39.48 kg/m   Review of Systems: See HPI above.     Objective:  Physical Exam:  Gen: NAD, comfortable in exam room  Back: No gross deformity, scoliosis. TTP right low thoracic, upper lumbar paraspinal region.  No midline or bony TTP. FROM with mild pain on trunk rotation and flexion in right paraspinal region noted above. Strength LEs 5/5  all muscle groups.   Negative SLRs. Sensation intact to light touch bilaterally.  Bilateral hips: No deformity. FROM with 5/5 strength. No tenderness to palpation. NVI distally. Negative logroll bilateral hips Negative fabers and piriformis stretches.   Assessment & Plan:  1. Mid-low back pain - chronic and recently worsening.  Consistent with paraspinal strain but radiographs of lumbar spine ordered and independently reviewed - no evidence bony pathology or rib subluxation.  IM toradol and depomedrol given.  Heat, tylenol, ibuprofen.  Start physical therapy and home exercises.  Consider muscle relaxant.  F/u in 1 month to 6 weeks.

## 2020-02-27 NOTE — Patient Instructions (Signed)
You were given an injection of toradol and depomedrol (a steroid). Get x-rays after you leave today - these will be of the lumbar spine but will also pick up your lower ribs and thoracic spine. If these are normal I'd recommend starting physical therapy. Heat 15 minutes at a time 3-4 times a day. Tylenol, ibuprofen as needed. A muscle relaxant is a consideration but usually doesn't make this heal faster. Follow up with me in 1 month to 6 weeks.

## 2020-02-28 ENCOUNTER — Telehealth: Payer: Self-pay

## 2020-02-28 ENCOUNTER — Encounter: Payer: Self-pay | Admitting: Family Medicine

## 2020-02-28 NOTE — Telephone Encounter (Signed)
Discussed with pt that her x-rays look good. Mild arthritis as would be expected but the lower ribs also look in place. Dr. Barbaraann Barthel doesn't think the arthritis she has is contributing and it's primarily muscular. Discussed PT referral - pt would like to go ahead with that. Referral faxed to [facility] PT at [facility].

## 2020-03-26 ENCOUNTER — Ambulatory Visit: Payer: BC Managed Care – PPO | Admitting: Family Medicine

## 2020-04-02 ENCOUNTER — Encounter: Payer: Self-pay | Admitting: Family Medicine

## 2020-04-02 ENCOUNTER — Other Ambulatory Visit: Payer: Self-pay

## 2020-04-02 ENCOUNTER — Ambulatory Visit (INDEPENDENT_AMBULATORY_CARE_PROVIDER_SITE_OTHER): Payer: BC Managed Care – PPO | Admitting: Family Medicine

## 2020-04-02 VITALS — BP 185/92 | Ht 64.0 in | Wt 230.0 lb

## 2020-04-02 DIAGNOSIS — G8929 Other chronic pain: Secondary | ICD-10-CM | POA: Diagnosis not present

## 2020-04-02 DIAGNOSIS — M545 Low back pain, unspecified: Secondary | ICD-10-CM

## 2020-04-02 NOTE — Progress Notes (Signed)
PCP: Aretta Nip, MD  Subjective:   HPI: Patient is a 54 y.o. female here for mid/low back pain.   8/4: She states she has had the pain for around 2 years now, on and off. The pain became worse over the past few months, to the point where she takes 4 ibuprofen 4 times a day. She is also seeing a chiropractor and getting acupuncture and laser therapy, which helps temporarily with the pain. The pain is worse with coughing, sneezing, prolonged walking or standing, and certain movements such as sitting down on her bed. The pain is localized and she denies any pain radiating down her legs. Denies pain with bending forward or back, numbness or tingling in her back or legs, or red flag symptoms such as fever, recent infection, incontinence, and weight loss.   9/8: Patient reports she's doing very well. She had pain about 2 weeks ago still then feels like after bowling and motions with this pain has improved tremendously. Only minimal soreness right side of mid-low back. No radiation. No numbness/tingling.  Past Medical History:  Diagnosis Date  . Hypertension   . Hypothyroidism   . Metabolic syndrome     Current Outpatient Medications on File Prior to Visit  Medication Sig Dispense Refill  . ergocalciferol (VITAMIN D2) 1.25 MG (50000 UT) capsule Take 50,000 Units by mouth once a week.    . estradiol (ESTRACE) 2 MG tablet Take by mouth.    . hydroxychloroquine (PLAQUENIL) 200 MG tablet Take 200 mg by mouth daily.    . Kava, Piper methysticum, (KAVA KAVA PO) Take by mouth.    . labetalol (NORMODYNE) 300 MG tablet Take by mouth.    . metFORMIN (GLUCOPHAGE) 500 MG tablet Take by mouth 2 (two) times daily with a meal.    . NP THYROID 90 MG tablet TAKE 1 TABLET(90 MG) BY MOUTH DAILY 90 tablet 5  . progesterone (PROMETRIUM) 100 MG capsule Take 100 mg by mouth daily.     No current facility-administered medications on file prior to visit.    Past Surgical History:  Procedure  Laterality Date  . DENTAL SURGERY      Allergies  Allergen Reactions  . Amlodipine Swelling  . Sulfa Antibiotics     Social History   Socioeconomic History  . Marital status: Significant Other    Spouse name: Not on file  . Number of children: Not on file  . Years of education: Not on file  . Highest education level: Not on file  Occupational History  . Not on file  Tobacco Use  . Smoking status: Never Smoker  . Smokeless tobacco: Never Used  Vaping Use  . Vaping Use: Never used  Substance and Sexual Activity  . Alcohol use: No  . Drug use: No  . Sexual activity: Yes    Birth control/protection: None  Other Topics Concern  . Not on file  Social History Narrative  . Not on file   Social Determinants of Health   Financial Resource Strain:   . Difficulty of Paying Living Expenses: Not on file  Food Insecurity:   . Worried About Charity fundraiser in the Last Year: Not on file  . Ran Out of Food in the Last Year: Not on file  Transportation Needs:   . Lack of Transportation (Medical): Not on file  . Lack of Transportation (Non-Medical): Not on file  Physical Activity:   . Days of Exercise per Week: Not on file  .  Minutes of Exercise per Session: Not on file  Stress:   . Feeling of Stress : Not on file  Social Connections:   . Frequency of Communication with Friends and Family: Not on file  . Frequency of Social Gatherings with Friends and Family: Not on file  . Attends Religious Services: Not on file  . Active Member of Clubs or Organizations: Not on file  . Attends Archivist Meetings: Not on file  . Marital Status: Not on file  Intimate Partner Violence:   . Fear of Current or Ex-Partner: Not on file  . Emotionally Abused: Not on file  . Physically Abused: Not on file  . Sexually Abused: Not on file    Family History  Problem Relation Age of Onset  . Kidney disease Father   . Diabetes Father   . Breast cancer Neg Hx     BP (!) 185/92    Ht 5\' 4"  (1.626 m)   Wt 230 lb (104.3 kg)   BMI 39.48 kg/m   Review of Systems: See HPI above.     Objective:  Physical Exam:  Gen: NAD, comfortable in exam room  Back: No gross deformity, scoliosis. No paraspinal TTP.  No midline or bony TTP. FROM. Strength LEs 5/5 all muscle groups.   Negative SLRs. Sensation intact to light touch bilaterally. Negative logroll bilateral hips   Assessment & Plan:  1. Mid-low back pain - chronic.  Significantly improved following bowling.  She's s/p chiropractic care, therapy, laser, IM toradol and depo.  F/u prn.

## 2020-05-08 ENCOUNTER — Other Ambulatory Visit: Payer: BC Managed Care – PPO

## 2020-05-15 ENCOUNTER — Other Ambulatory Visit: Payer: BC Managed Care – PPO

## 2020-06-09 ENCOUNTER — Ambulatory Visit (HOSPITAL_COMMUNITY)
Admission: RE | Admit: 2020-06-09 | Discharge: 2020-06-09 | Disposition: A | Discharge status: Home / Self Care | Payer: BC Managed Care – PPO | Source: Ambulatory Visit | Attending: Internal Medicine | Admitting: Internal Medicine

## 2020-06-09 ENCOUNTER — Other Ambulatory Visit: Payer: Self-pay

## 2020-06-09 ENCOUNTER — Other Ambulatory Visit (HOSPITAL_COMMUNITY): Payer: Self-pay | Admitting: Internal Medicine

## 2020-06-09 DIAGNOSIS — R600 Localized edema: Secondary | ICD-10-CM

## 2020-06-26 ENCOUNTER — Inpatient Hospital Stay: Admission: RE | Admit: 2020-06-26 | Payer: BC Managed Care – PPO | Source: Ambulatory Visit

## 2020-09-03 LAB — COLOGUARD

## 2020-09-17 LAB — COLOGUARD: COLOGUARD: NEGATIVE

## 2020-09-30 ENCOUNTER — Ambulatory Visit
Admission: RE | Admit: 2020-09-30 | Discharge: 2020-09-30 | Disposition: A | Discharge status: Home / Self Care | Payer: BC Managed Care – PPO | Source: Ambulatory Visit | Attending: Obstetrics and Gynecology | Admitting: Obstetrics and Gynecology

## 2020-09-30 ENCOUNTER — Other Ambulatory Visit: Payer: Self-pay

## 2020-09-30 DIAGNOSIS — R928 Other abnormal and inconclusive findings on diagnostic imaging of breast: Secondary | ICD-10-CM

## 2020-10-22 ENCOUNTER — Encounter (HOSPITAL_COMMUNITY): Payer: Self-pay

## 2020-10-22 ENCOUNTER — Telehealth: Payer: Self-pay | Admitting: Diagnostic Radiology

## 2020-10-27 ENCOUNTER — Other Ambulatory Visit: Payer: Self-pay | Admitting: Obstetrics and Gynecology

## 2020-10-27 DIAGNOSIS — N6310 Unspecified lump in the right breast, unspecified quadrant: Secondary | ICD-10-CM

## 2021-01-02 ENCOUNTER — Ambulatory Visit (INDEPENDENT_AMBULATORY_CARE_PROVIDER_SITE_OTHER): Payer: 59

## 2021-01-02 ENCOUNTER — Other Ambulatory Visit: Payer: Self-pay

## 2021-01-02 ENCOUNTER — Encounter: Payer: Self-pay | Admitting: Emergency Medicine

## 2021-01-02 ENCOUNTER — Ambulatory Visit (INDEPENDENT_AMBULATORY_CARE_PROVIDER_SITE_OTHER): Payer: Self-pay | Admitting: Emergency Medicine

## 2021-01-02 DIAGNOSIS — R0600 Dyspnea, unspecified: Secondary | ICD-10-CM | POA: Insufficient documentation

## 2021-01-02 DIAGNOSIS — R0602 Shortness of breath: Secondary | ICD-10-CM | POA: Diagnosis not present

## 2021-01-02 NOTE — Progress Notes (Signed)
Subjective:    Patient ID: Cassie [name], female    DOB: 05-24-1966, 55 y.o.   MRN: [id number]  HPI 55 year old never smoker with a history of hypertension, hypothyroidism, . Referred today for hoarseness and SOB.  Has a history of a complicated critical care hospitalization with ARDS and chronic respiratory failure requiring tracheostomy due to influenza, multiorgan failure and acute renal failure.  She required tracheostomy.  She has been seen in our office before by Dr. Chase Caller following this hospitalization and for dyspnea, hoarseness. Of note she was being evaluated for IVF in 2019, was on fertility meds - she wonders if they may have impacted her breathing. She had COVID in 07/2020.   Today she describes SOB that can happen with some exertion, with bending. Has trouble with maintaining a conversation. She gained some wt during IVF, and post hospitalization, about 20 lbs. She can get raspy voice with allergy season.   CXR 11/28/2017 reviewed by me, shows no abnormalities.   Review of Systems As per HPI  Past Medical History:  Diagnosis Date   Hypertension    Hypothyroidism    Metabolic syndrome      Family History  Problem Relation Age of Onset   Kidney disease Father    Diabetes Father    Breast cancer Neg Hx      Social History   Socioeconomic History   Marital status: Significant Other    Spouse name: Not on file   Number of children: Not on file   Years of education: Not on file   Highest education level: Not on file  Occupational History   Not on file  Tobacco Use   Smoking status: Never   Smokeless tobacco: Never  Vaping Use   Vaping Use: Never used  Substance and Sexual Activity   Alcohol use: No   Drug use: No   Sexual activity: Yes    Birth control/protection: None  Other Topics Concern   Not on file  Social History Narrative   Not on file   Social Determinants of Health   Financial Resource Strain: Not on file  Food Insecurity:  Not on file  Transportation Needs: Not on file  Physical Activity: Not on file  Stress: Not on file  Social Connections: Not on file  Intimate Partner Violence: Not on file     Allergies  Allergen Reactions   Amlodipine Swelling   Sulfa Antibiotics      Outpatient Medications Prior to Visit  Medication Sig Dispense Refill   albuterol (VENTOLIN HFA) 108 (90 Base) MCG/ACT inhaler Inhale 1-2 puffs into the lungs every 6 (six) hours as needed for wheezing or shortness of breath.     fluticasone (FLONASE) 50 MCG/ACT nasal spray Place 1 spray into both nostrils daily.     furosemide (LASIX) 20 MG tablet Take 20 mg by mouth 2 (two) times daily.     labetalol (NORMODYNE) 300 MG tablet Take 300 mg by mouth 2 (two) times daily.     metFORMIN (GLUCOPHAGE) 500 MG tablet Take by mouth 2 (two) times daily with a meal.     ergocalciferol (VITAMIN D2) 1.25 MG (50000 UT) capsule Take 50,000 Units by mouth once a week.     estradiol (ESTRACE) 2 MG tablet Take by mouth.     hydroxychloroquine (PLAQUENIL) 200 MG tablet Take 200 mg by mouth daily.     Kava, Piper methysticum, (KAVA KAVA PO) Take by mouth.     labetalol (NORMODYNE) 300 MG tablet  Take by mouth.     metFORMIN (GLUCOPHAGE) 500 MG tablet Take by mouth 2 (two) times daily with a meal.     NP THYROID 90 MG tablet TAKE 1 TABLET(90 MG) BY MOUTH DAILY 90 tablet 5   progesterone (PROMETRIUM) 100 MG capsule Take 100 mg by mouth daily.     No facility-administered medications prior to visit.         Objective:   Physical Exam Vitals:   01/02/21 1535  BP: 126/84  Pulse: 65  Temp: (!) 97.4 F (36.3 C)  TempSrc: Temporal  SpO2: 97%  Weight: 244 lb (110.7 kg)  Height: 5\' 4"  (1.626 m)   Gen: Pleasant, obese woman, in no distress,  normal affect, shallow breaths but overall comfortable resp pattern  ENT: No lesions,  mouth clear,  oropharynx clear, no postnasal drip, strong voice  Neck: No JVD, no stridor  Lungs: No use of accessory  muscles, no crackles or wheezing on normal respiration, no wheeze on forced expiration  Cardiovascular: RRR, heart sounds normal, no murmur or gallops, no peripheral edema  Musculoskeletal: No deformities, no cyanosis or clubbing  Neuro: alert, awake, non focal  Skin: Warm, no lesions or rash      Assessment & Plan:   Dyspnea Suspect multifactorial.  At least in part due to restriction and some weight gain based on her description of symptoms.  Suspect also possible upper airway irritability, may be a component of VCD.  Symptoms can be made worse by allergies.  She denies any GERD.  Consider also tracheal stenosis, other upper airway component since she did have a tracheostomy.  We should be able to see this on spirometry.  Finally consider the possibility of interstitial lung disease in the aftermath of her prior ARDS and her recent COVID-19.  Check a chest x-ray now, consider CT chest if any evidence of interstitial change PFTs to evaluate airflows Could consider imaging of the trachea to look for fixed stenosis depending on her spirometry She will probably benefit going forward from treatment for rhinitis, consider also empiric treatment for GERD She will benefit from weight loss, exercise and conditioning. Plan to follow-up to review her studies when available.  Baltazar Apo, MD, PhD 01/02/2021, 4:03 PM [facility] [phone] or if no answer before 7:00PM call [phone] For any issues after 7:00PM please call eLink [phone]

## 2021-01-02 NOTE — Assessment & Plan Note (Signed)
Suspect multifactorial.  At least in part due to restriction and some weight gain based on her description of symptoms.  Suspect also possible upper airway irritability, may be a component of VCD.  Symptoms can be made worse by allergies.  She denies any GERD.  Consider also tracheal stenosis, other upper airway component since she did have a tracheostomy.  We should be able to see this on spirometry.  Finally consider the possibility of interstitial lung disease in the aftermath of her prior ARDS and her recent COVID-19.  Check a chest x-ray now, consider CT chest if any evidence of interstitial change PFTs to evaluate airflows Could consider imaging of the trachea to look for fixed stenosis depending on her spirometry She will probably benefit going forward from treatment for rhinitis, consider also empiric treatment for GERD She will benefit from weight loss, exercise and conditioning. Plan to follow-up to review her studies when available.

## 2021-01-02 NOTE — Patient Instructions (Signed)
We will perform pulmonary function testing in next office visit. We will get a chest x-ray today. Follow Dr. Lamonte Sakai next available with full PFT on the same day.

## 2021-02-23 ENCOUNTER — Other Ambulatory Visit: Payer: Self-pay | Admitting: Physical Medicine & Rehabilitation

## 2021-02-23 DIAGNOSIS — I10 Essential (primary) hypertension: Secondary | ICD-10-CM

## 2021-02-28 NOTE — Progress Notes (Signed)
Entered in error

## 2021-03-03 ENCOUNTER — Ambulatory Visit: Payer: 59 | Admitting: Primary Care

## 2021-03-03 ENCOUNTER — Other Ambulatory Visit: Payer: Self-pay

## 2021-03-03 NOTE — Progress Notes (Deleted)
$'@Patient'S$  ID: Cassie [name], female    DOB: 02-20-1966, 55 y.o.   MRN: [id number]  No chief complaint on file.   Referring provider: Aretta Nip, MD  HPI: 55 year old female, never smoked.  Past medical history significant for hypertension, hypothyroidism. Patient of Dr. Lamonte Sakai, seen on 01/02/21 for initial consult for SOB/voice hoarseness.   Previous [facility] pulmonary encounter: 01/02/21 - Dr. Lamonte Sakai, consult  55 year old never smoker with a history of hypertension, hypothyroidism, . Referred today for hoarseness and SOB.  Has a history of a complicated critical care hospitalization with ARDS and chronic respiratory failure requiring tracheostomy due to influenza, multiorgan failure and acute renal failure.  She required tracheostomy.  She has been seen in our office before by Dr. Chase Caller following this hospitalization and for dyspnea, hoarseness. Of note she was being evaluated for IVF in 2019, was on fertility meds - she wonders if they may have impacted her breathing. She had COVID in 07/2020.   Today she describes SOB that can happen with some exertion, with bending. Has trouble with maintaining a conversation. She gained some wt during IVF, and post hospitalization, about 20 lbs. She can get raspy voice with allergy season.   CXR 11/28/2017 reviewed by me, shows no abnormalities.   03/03/2021- interim hx  Patient presents today for 2 month follow-up with PFTs.           Allergies  Allergen Reactions   Amlodipine Swelling   Sulfa Antibiotics      There is no immunization history on file for this patient.  Past Medical History:  Diagnosis Date   Hypertension    Hypothyroidism    Metabolic syndrome     Tobacco History: Social History   Tobacco Use  Smoking Status Never  Smokeless Tobacco Never   Counseling given: Not Answered   Outpatient Medications Prior to Visit  Medication Sig Dispense Refill   albuterol (VENTOLIN HFA) 108 (90 Base) MCG/ACT  inhaler Inhale 1-2 puffs into the lungs every 6 (six) hours as needed for wheezing or shortness of breath.     fluticasone (FLONASE) 50 MCG/ACT nasal spray Place 1 spray into both nostrils daily.     furosemide (LASIX) 20 MG tablet Take 20 mg by mouth 2 (two) times daily.     labetalol (NORMODYNE) 300 MG tablet Take 300 mg by mouth 2 (two) times daily.     metFORMIN (GLUCOPHAGE) 500 MG tablet Take by mouth 2 (two) times daily with a meal.     No facility-administered medications prior to visit.      Review of Systems  Review of Systems   Physical Exam  There were no vitals taken for this visit. Physical Exam   Lab Results:  CBC    Component Value Date/Time   WBC 11.2 (H) 06/01/2018 1634   WBC 10.9 (H) 11/28/2017 1249   RBC 5.09 06/01/2018 1634   RBC 4.99 11/28/2017 1249   HGB 13.7 06/01/2018 1634   HCT 39.9 06/01/2018 1634   PLT 324 06/01/2018 1634   MCV 78 (L) 06/01/2018 1634   MCH 26.9 06/01/2018 1634   MCH 24.5 (L) 09/12/2017 0626   MCHC 34.3 06/01/2018 1634   MCHC 33.1 11/28/2017 1249   RDW 14.6 06/01/2018 1634   LYMPHSABS 2.6 06/01/2018 1634   MONOABS 0.7 11/28/2017 1249   EOSABS 0.2 06/01/2018 1634   BASOSABS 0.1 06/01/2018 1634    BMET    Component Value Date/Time   NA 140 06/01/2018 1634   K  3.9 06/01/2018 1634   CL 102 06/01/2018 1634   CO2 25 06/01/2018 1634   GLUCOSE 93 06/01/2018 1634   GLUCOSE 91 11/28/2017 1249   BUN 9 06/01/2018 1634   CREATININE 0.73 06/01/2018 1634   CALCIUM 9.5 06/01/2018 1634   GFRNONAA 94 06/01/2018 1634   GFRAA 109 06/01/2018 1634    BNP    Component Value Date/Time   BNP 39.9 08/18/2017 0756    ProBNP No results found for: PROBNP  Imaging: No results found.   Assessment & Plan:   No problem-specific Assessment & Plan notes found for this encounter.     Martyn Ehrich, NP 03/03/2021

## 2021-03-06 ENCOUNTER — Other Ambulatory Visit: Payer: Self-pay

## 2021-03-06 ENCOUNTER — Ambulatory Visit (INDEPENDENT_AMBULATORY_CARE_PROVIDER_SITE_OTHER): Payer: 59 | Admitting: Emergency Medicine

## 2021-03-06 DIAGNOSIS — R0602 Shortness of breath: Secondary | ICD-10-CM

## 2021-03-06 LAB — PULMONARY FUNCTION TEST
DL/VA % pred: 138 %
DL/VA: 5.9 ml/min/mmHg/L
DLCO unc % pred: 76 %
DLCO unc: 15.48 ml/min/mmHg
FEF 25-75 Post: 2.59 L/sec
FEF 25-75 Pre: 2.21 L/sec
FEF2575-%Change-Post: 17 %
FEF2575-%Pred-Post: 102 %
FEF2575-%Pred-Pre: 87 %
FEV1-%Change-Post: 6 %
FEV1-%Pred-Post: 55 %
FEV1-%Pred-Pre: 52 %
FEV1-Post: 1.47 L
FEV1-Pre: 1.38 L
FEV1FVC-%Change-Post: 4 %
FEV1FVC-%Pred-Pre: 110 %
FEV6-%Change-Post: 2 %
FEV6-%Pred-Post: 49 %
FEV6-%Pred-Pre: 48 %
FEV6-Post: 1.61 L
FEV6-Pre: 1.57 L
FEV6FVC-%Change-Post: 0 %
FEV6FVC-%Pred-Post: 103 %
FEV6FVC-%Pred-Pre: 102 %
FVC-%Change-Post: 2 %
FVC-%Pred-Post: 47 %
FVC-%Pred-Pre: 46 %
FVC-Post: 1.61 L
FVC-Pre: 1.58 L
Post FEV1/FVC ratio: 91 %
Post FEV6/FVC ratio: 100 %
Pre FEV1/FVC ratio: 87 %
Pre FEV6/FVC Ratio: 99 %

## 2021-03-06 NOTE — Progress Notes (Signed)
PFT done today. 

## 2021-03-10 ENCOUNTER — Other Ambulatory Visit: Payer: Self-pay

## 2021-03-10 ENCOUNTER — Ambulatory Visit (INDEPENDENT_AMBULATORY_CARE_PROVIDER_SITE_OTHER): Payer: 59 | Admitting: Adult Health

## 2021-03-10 ENCOUNTER — Telehealth: Payer: Self-pay | Admitting: *Deleted

## 2021-03-10 ENCOUNTER — Encounter: Payer: Self-pay | Admitting: Adult Health

## 2021-03-10 VITALS — BP 142/76 | HR 71 | Temp 98.2°F | Ht 64.0 in | Wt 234.4 lb

## 2021-03-10 DIAGNOSIS — U071 COVID-19: Secondary | ICD-10-CM | POA: Diagnosis not present

## 2021-03-10 DIAGNOSIS — R490 Dysphonia: Secondary | ICD-10-CM | POA: Diagnosis not present

## 2021-03-10 DIAGNOSIS — J80 Acute respiratory distress syndrome: Secondary | ICD-10-CM

## 2021-03-10 DIAGNOSIS — R0602 Shortness of breath: Secondary | ICD-10-CM

## 2021-03-10 DIAGNOSIS — J984 Other disorders of lung: Secondary | ICD-10-CM

## 2021-03-10 NOTE — Progress Notes (Signed)
$'@Patient'Q$  ID: Cassie [name], female    DOB: 12/16/1965, 55 y.o.   MRN: [id number]  Chief Complaint  Patient presents with   Follow-up    Referring provider: Aretta Nip, MD  HPI: 55 year old never smoker seen for pulmonary consult for hoarseness and shortness of breath January 02, 2021 Medical history significant for critical illness  07/2017 with hospitalization for ARDS, respiratory failure requiring tracheostomy due to influenza, complicated by acute renal failure -COVID infection in January 2022 , Spring of 2021 . Describes as mild Cold symptoms  Lawyer .  Medical history significant for Metabolic syndrome.    TEST/EVENTS :  2D echo January 2019 EF 55 to 123456, grade 2 diastolic dysfunction   123456 Follow up ; Dyspnea  Patient returns for a 56-monthfollow-up.  Patient was seen last visit for ongoing shortness of breath and hoarseness.  She had had a critical illness in January 2019 with ARDS, respiratory failure requiring tracheostomy due to influenza. She was set up for pulmonary function testing that was completed on March 06, 2021 that showed moderate to severe restriction with an FEV1 at 55%, ratio 91, FVC 47%, no significant bronchodilator response, TLC 43%, DLCO 76%.  Chest x-ray last visit showed pulmonary vascular congestion without overt edema. Says it took her a long time to get her strength back. Is able to go to gym but not able to get breath as good. Feels she has decreased activity tolerance. She definitely has improved but not back to 100% .  Working on weight loss.  Has some dry cough , tickling sensation. Some post nasal drip . Takes claritin daily  Sometimes sudafed and benadryl.  Declines covid vaccine . Took Paxlovid in January for 3 days.  Complains of seasonal allergies .   Current weight is at 234.  BMI 40  Went through IVF for last 1-2 years, unsucessful x 4 missed pregnancies.        Allergies  Allergen Reactions   Amlodipine  Swelling   Sulfa Antibiotics      There is no immunization history on file for this patient.  Past Medical History:  Diagnosis Date   Hypertension    Hypothyroidism    Metabolic syndrome     Tobacco History: Social History   Tobacco Use  Smoking Status Never  Smokeless Tobacco Never   Counseling given: Not Answered   Outpatient Medications Prior to Visit  Medication Sig Dispense Refill   albuterol (VENTOLIN HFA) 108 (90 Base) MCG/ACT inhaler Inhale 1-2 puffs into the lungs every 6 (six) hours as needed for wheezing or shortness of breath.     fluticasone (FLONASE) 50 MCG/ACT nasal spray Place 1 spray into both nostrils daily.     furosemide (LASIX) 20 MG tablet Take 20 mg by mouth 2 (two) times daily.     labetalol (NORMODYNE) 300 MG tablet Take 300 mg by mouth 2 (two) times daily.     metFORMIN (GLUCOPHAGE) 500 MG tablet Take by mouth 2 (two) times daily with a meal.     No facility-administered medications prior to visit.     Review of Systems:   Constitutional:   No  weight loss, night sweats,  Fevers, chills, fatigue, or  lassitude.  HEENT:   No headaches,  Difficulty swallowing,  Tooth/dental problems, or  Sore throat,                No sneezing, itching, ear ache, nasal congestion, post nasal drip,   CV:  No  chest pain,  Orthopnea, PND, swelling in lower extremities, anasarca, dizziness, palpitations, syncope.   GI  No heartburn, indigestion, abdominal pain, nausea, vomiting, diarrhea, change in bowel habits, loss of appetite, bloody stools.   Resp: No shortness of breath with exertion or at rest.  No excess mucus, no productive cough,  No non-productive cough,  No coughing up of blood.  No change in color of mucus.  No wheezing.  No chest wall deformity  Skin: no rash or lesions.  GU: no dysuria, change in color of urine, no urgency or frequency.  No flank pain, no hematuria   MS:  No joint pain or swelling.  No decreased range of motion.  No back  pain.    Physical Exam  BP (!) 142/76 (BP Location: Right Arm)   Pulse 71   Temp 98.2 F (36.8 C) (Oral)   Ht '5\' 4"'$  (1.626 m)   Wt 234 lb 6.4 oz (106.3 kg)   SpO2 95%   BMI 40.23 kg/m   GEN: A/Ox3; pleasant , NAD, well nourished    HEENT:  [place]/AT,  EACs-clear, TMs-wnl, NOSE-clear, THROAT-clear, no lesions, no postnasal drip or exudate noted. Class 3-4 MP airway   NECK:  Supple w/ fair ROM; no JVD; normal carotid impulses w/o bruits; no thyromegaly or nodules palpated; no lymphadenopathy.  Well healed stoma scar   RESP  Clear  P & A; w/o, wheezes/ rales/ or rhonchi. no accessory muscle use, no dullness to percussion  CARD:  RRR, no m/r/g, tr  peripheral edema, pulses intact, no cyanosis or clubbing.  GI:   Soft & nt; nml bowel sounds; no organomegaly or masses detected.   Musco: Warm bil, no deformities or joint swelling noted.   Neuro: alert, no focal deficits noted.    Skin: Warm, no lesions or rashes       ProBNP No results found for: PROBNP  Imaging: No results found.    PFT Results Latest Ref Rng & Units 03/06/2021  FVC-Pre L 1.58  FVC-Predicted Pre % 46  FVC-Post L 1.61  FVC-Predicted Post % 47  Pre FEV1/FVC % % 87  Post FEV1/FCV % % 91  FEV1-Pre L 1.38  FEV1-Predicted Pre % 52  FEV1-Post L 1.47  DLCO uncorrected ml/min/mmHg 15.48  DLCO UNC% % 76  DLVA Predicted % 138    No results found for: NITRICOXIDE      Assessment & Plan:   ARDS (adult respiratory distress syndrome) (HCC) History of ARDS after influenza in 2019.  PFT showed restrictive lung disease.  Check high-resolution CT chest for scarring.  Plan  Patient Instructions  Set up for HRCT Chest -hx of ARDS  Continue on Claritin '10mg'$  daily  Check 2 D Echo .  Albuterol inhaler As needed   Refer to ENT for chronic hoarsness  Refer to pulmonary rehab.  Follow up with Dr. Lamonte Sakai in 6-8 weeks and As needed   Please contact office for sooner follow up if symptoms do not improve or  worsen or seek emergency care        Restrictive lung disease Restrictive lung disease -?obesity related.  Check HRCT chest for scarring .  Refer to pulmonary rehab   Plan  Patient Instructions  Set up for HRCT Chest -hx of ARDS  Continue on Claritin '10mg'$  daily  Check 2 D Echo .  Albuterol inhaler As needed   Refer to ENT for chronic hoarsness  Refer to pulmonary rehab.  Follow up with Dr. Lamonte Sakai in 6-8  weeks and As needed   Please contact office for sooner follow up if symptoms do not improve or worsen or seek emergency care         Chronic hoarseness Complains of hoarseness for last 2 years since critical illness with tracheostomy .  Refer to ENT    Dyspnea Dyspnea suspect is multifactoral along with deconditioning.  Check 2 D echo , HRCT chest . , labs cbc/bmet/bnp  Add albuterol inhaler As needed   Pulmonary rehab .  Plan  Patient Instructions  Set up for HRCT Chest -hx of ARDS  Continue on Claritin '10mg'$  daily  Check 2 D Echo .  Albuterol inhaler As needed   Refer to ENT for chronic hoarsness  Refer to pulmonary rehab.  Follow up with Dr. Lamonte Sakai in 6-8 weeks and As needed   Please contact office for sooner follow up if symptoms do not improve or worsen or seek emergency care        I spent   42 minutes dedicated to the care of this patient on the date of this encounter to include pre-visit review of records, face-to-face time with the patient discussing conditions above, post visit ordering of testing, clinical documentation with the electronic health record, making appropriate referrals as documented, and communicating necessary findings to members of the patients care team.    Rexene Edison, NP 03/10/2021

## 2021-03-10 NOTE — Assessment & Plan Note (Signed)
Restrictive lung disease -?obesity related.  Check HRCT chest for scarring .  Refer to pulmonary rehab   Plan  Patient Instructions  Set up for HRCT Chest -hx of ARDS  Continue on Claritin '10mg'$  daily  Check 2 D Echo .  Albuterol inhaler As needed   Refer to ENT for chronic hoarsness  Refer to pulmonary rehab.  Follow up with Dr. Lamonte Sakai in 6-8 weeks and As needed   Please contact office for sooner follow up if symptoms do not improve or worsen or seek emergency care

## 2021-03-10 NOTE — Assessment & Plan Note (Signed)
Complains of hoarseness for last 2 years since critical illness with tracheostomy .  Refer to ENT

## 2021-03-10 NOTE — Assessment & Plan Note (Signed)
Dyspnea suspect is multifactoral along with deconditioning.  Check 2 D echo , HRCT chest . , labs cbc/bmet/bnp  Add albuterol inhaler As needed   Pulmonary rehab .  Plan  Patient Instructions  Set up for HRCT Chest -hx of ARDS  Continue on Claritin '10mg'$  daily  Check 2 D Echo .  Albuterol inhaler As needed   Refer to ENT for chronic hoarsness  Refer to pulmonary rehab.  Follow up with Dr. Lamonte Sakai in 6-8 weeks and As needed   Please contact office for sooner follow up if symptoms do not improve or worsen or seek emergency care

## 2021-03-10 NOTE — Patient Instructions (Addendum)
Set up for HRCT Chest -hx of ARDS  Continue on Claritin '10mg'$  daily  Check 2 D Echo .  Albuterol inhaler As needed   Refer to ENT for chronic hoarsness  Refer to pulmonary rehab.  Follow up with Dr. Lamonte Sakai in 6-8 weeks and As needed   Please contact office for sooner follow up if symptoms do not improve or worsen or seek emergency care

## 2021-03-10 NOTE — Assessment & Plan Note (Signed)
History of ARDS after influenza in 2019.  PFT showed restrictive lung disease.  Check high-resolution CT chest for scarring.  Plan  Patient Instructions  Set up for HRCT Chest -hx of ARDS  Continue on Claritin '10mg'$  daily  Check 2 D Echo .  Albuterol inhaler As needed   Refer to ENT for chronic hoarsness  Refer to pulmonary rehab.  Follow up with Cassie [name] in 6-8 weeks and As needed   Please contact office for sooner follow up if symptoms do not improve or worsen or seek emergency care

## 2021-03-10 NOTE — Telephone Encounter (Signed)
Patient advised that Cassie [name] wanted to get some lab work since she had not had any drawn in a while.  Called and spoke with patient regarding lab work, advised she could come by between 9:15 am and 3:45 pm to have it drawn.  She verbalized understanding.  She said she would come around 3 pm on Thursday or Friday of this week.  Nothing further needed.

## 2021-03-11 ENCOUNTER — Telehealth: Payer: Self-pay | Admitting: Emergency Medicine

## 2021-03-11 DIAGNOSIS — U071 COVID-19: Secondary | ICD-10-CM

## 2021-03-11 DIAGNOSIS — J80 Acute respiratory distress syndrome: Secondary | ICD-10-CM

## 2021-03-12 NOTE — Telephone Encounter (Signed)
PCC's do we need to put a new order in for Pulm Rehab to have RB sign this off?   Or can we use the same order that is already in?  Thanks

## 2021-03-13 NOTE — Telephone Encounter (Signed)
Pulmonary rehab ordered placed under Dr. Lamonte Sakai. Nothing further needed at this time.

## 2021-03-13 NOTE — Telephone Encounter (Signed)
Yes please

## 2021-03-13 NOTE — Telephone Encounter (Signed)
Pulmonary Rehab has to be ordered by a physician so a new order will need to be placed.

## 2021-03-13 NOTE — Telephone Encounter (Signed)
RB please advise if we can go ahead and send in order under you for pulm rehab for patient. Thanks :)

## 2021-03-17 ENCOUNTER — Ambulatory Visit (INDEPENDENT_AMBULATORY_CARE_PROVIDER_SITE_OTHER)
Admission: RE | Admit: 2021-03-17 | Discharge: 2021-03-17 | Disposition: A | Discharge status: Home / Self Care | Payer: 59 | Source: Ambulatory Visit | Attending: Adult Health | Admitting: Adult Health

## 2021-03-17 ENCOUNTER — Other Ambulatory Visit: Payer: Self-pay

## 2021-03-17 DIAGNOSIS — J80 Acute respiratory distress syndrome: Secondary | ICD-10-CM

## 2021-03-17 DIAGNOSIS — U071 COVID-19: Secondary | ICD-10-CM

## 2021-03-18 ENCOUNTER — Encounter (HOSPITAL_COMMUNITY): Payer: Self-pay | Admitting: *Deleted

## 2021-03-18 NOTE — Progress Notes (Signed)
Received referral from Dr. Lamonte Sakai  for this pt to participate in pulmonary rehab with the the dual diagnosis of Primary Dyspnea and Secondary Covid 19 - unspecified. Pt has experienced greater than 4 weeks of persistent symptoms that include respiratory dysfunction post Covid infection in January 2022.  Clinical review of pt follow up appt on 8/16 Pulmonary office  with Rexene Edison NP with Dr. Lamonte Sakai note.  Pt with Covid Risk Score - 2. Pt appropriate for scheduling for Pulmonary rehab.  Will forward to support staff for scheduling and verification of insurance eligibility/benefits with pt consent. Cherre Huger, BSN Cardiac and Training and development officer

## 2021-03-25 NOTE — Progress Notes (Signed)
I called and left a message for patient to call back with results.

## 2021-03-26 ENCOUNTER — Encounter: Payer: Self-pay | Admitting: *Deleted

## 2021-03-26 ENCOUNTER — Encounter (HOSPITAL_COMMUNITY): Payer: Self-pay | Admitting: Adult Health

## 2021-03-26 NOTE — Progress Notes (Signed)
ATC x2, LMTCB.  Unable to reach letter sent.  Closing per policy.

## 2021-03-31 ENCOUNTER — Telehealth: Payer: Self-pay | Admitting: Adult Health

## 2021-03-31 NOTE — Telephone Encounter (Signed)
Cassie Needles, NP  03/20/2021  2:22 PM EDT     CT chest shows no enlarged lymph nodes Only mild air trapping Great news no sign of interstitial lung disease Enlargement of pulmonary artery, this can be further evaluated on 2D echo which is pending We will discuss in detail at follow-up visit-make sure she has a follow-up visit from previous office visit recommendations         I spoke with the pt and made her aware of the above CT results. I have scheduled her for the rov with Dr Lamonte Sakai for 05/15/21. She has not been set up for ECHO. States that she may have missed calls from [facility] bc she has been working out of town. She can be reached this week at [phone]. Thanks!

## 2021-03-31 NOTE — Telephone Encounter (Signed)
I have called the patient and have told her those are scheduled at Cardiology and they had tried to reach her and sent her call over to them

## 2021-04-06 ENCOUNTER — Ambulatory Visit (INDEPENDENT_AMBULATORY_CARE_PROVIDER_SITE_OTHER): Payer: 59 | Admitting: Otolaryngology

## 2021-04-06 ENCOUNTER — Other Ambulatory Visit: Payer: Self-pay

## 2021-04-06 DIAGNOSIS — R490 Dysphonia: Secondary | ICD-10-CM | POA: Diagnosis not present

## 2021-04-06 DIAGNOSIS — J310 Chronic rhinitis: Secondary | ICD-10-CM

## 2021-04-06 DIAGNOSIS — K219 Gastro-esophageal reflux disease without esophagitis: Secondary | ICD-10-CM | POA: Diagnosis not present

## 2021-04-06 NOTE — Progress Notes (Signed)
HPI: Cassie [name] is a 55 y.o. female who presents is referred by her PCP for evaluation of hoarseness and shortness of breath.  This began initially following a bad pneumonia that she developed following a trip to [place] in 2019.  She was hospitalized for a month and had a have a tracheostomy placed because of the prolonged intubation.  She states that she was intubated for about 3 weeks prior to the tracheostomy.  It was not clear what caused the bad pneumonia but possibly could have represented an early case of COVID. Ever since that time she never felt like she got her voice back to normal and complains of a raspy type voice as well as intermittent shortness of breath.  She has seen pulmonary and has had a CT scan of her chest performed.  I reviewed a CT scan of her chest that was performed 3 weeks ago and this showed no evidence of tracheal stenosis or subglottic stenosis. She is breathing relatively comfortably today in the office and her voice sounds good with minimal raspiness.  Past Medical History:  Diagnosis Date   Hypertension    Hypothyroidism    Metabolic syndrome    Past Surgical History:  Procedure Laterality Date   DENTAL SURGERY     Social History   Socioeconomic History   Marital status: Significant Other    Spouse name: Not on file   Number of children: Not on file   Years of education: Not on file   Highest education level: Not on file  Occupational History   Not on file  Tobacco Use   Smoking status: Never   Smokeless tobacco: Never  Vaping Use   Vaping Use: Never used  Substance and Sexual Activity   Alcohol use: No   Drug use: No   Sexual activity: Yes    Birth control/protection: None  Other Topics Concern   Not on file  Social History Narrative   Not on file   Social Determinants of Health   Financial Resource Strain: Not on file  Food Insecurity: Not on file  Transportation Needs: Not on file  Physical Activity: Not on file   Stress: Not on file  Social Connections: Not on file   Family History  Problem Relation Age of Onset   Kidney disease Father    Diabetes Father    Breast cancer Neg Hx    Allergies  Allergen Reactions   Amlodipine Swelling   Sulfa Antibiotics    Prior to Admission medications   Medication Sig Start Date End Date Taking? Authorizing Provider  albuterol (VENTOLIN HFA) 108 (90 Base) MCG/ACT inhaler Inhale 1-2 puffs into the lungs every 6 (six) hours as needed for wheezing or shortness of breath.    [provider]  fluticasone (FLONASE) 50 MCG/ACT nasal spray Place 1 spray into both nostrils daily. 12/05/20   [provider]  furosemide (LASIX) 20 MG tablet Take 20 mg by mouth 2 (two) times daily. 12/08/20   [provider]  labetalol (NORMODYNE) 300 MG tablet Take 300 mg by mouth 2 (two) times daily.    [provider]  metFORMIN (GLUCOPHAGE) 500 MG tablet Take by mouth 2 (two) times daily with a meal.    [provider]     Positive ROS: Otherwise negative.  She is claustrophobic and complains of increased anxiety.  All other systems have been reviewed and were otherwise negative with the exception of those mentioned in the HPI and as above.  Physical Exam: Constitutional: Alert, well-appearing, no acute distress Ears: External ears without lesions or tenderness. Ear canals are clear bilaterally with intact, clear TMs.  Nasal: External nose without lesions. Septum slightly deviated to the right.  Mild rhinitis.  Clear nasal passages bilaterally with no polyps noted.  Both millimeters regions were clear with no mucopurulent discharge noted.. Clear nasal passages Oral: Lips and gums without lesions. Tongue and palate mucosa without lesions. Posterior oropharynx clear.  Tonsil regions appear benign bilaterally. Fiberoptic laryngoscopy was performed to the left nostril.  The nasopharynx was clear.  The middle meatus was clear.  The base of  tongue vallecula epiglottis were normal.  On evaluation of vocal cords the vocal cords were slightly edematous but this was minimal with no specific vocal cord lesions noted.  She had mild edema of the arytenoid that could be secondary to reflux.  But no mucosal lesions noted.  The glottis was widely patent and subglottis was clear.  Vocal cords had symmetric mobility with no specific vocal cord lesions noted. Neck: No palpable adenopathy or masses Respiratory: Breathing comfortably  Skin: No facial/neck lesions or rash noted.  Laryngoscopy  Date/Time: 04/06/2021 5:57 PM Performed by: Rozetta Nunnery, MD Authorized by: Rozetta Nunnery, MD   Consent:    Consent obtained:  Verbal   Consent given by:  Patient Procedure details:    Indications: direct visualization of the upper aerodigestive tract and hoarseness, dysphagia, or aspiration     Medication:  Afrin   Instrument: flexible fiberoptic laryngoscope     Scope location: left nare   Sinus:    Left middle meatus: normal     Left nasopharynx: normal   Mouth:    Oropharynx: normal     Vallecula: normal     Base of tongue: normal     Epiglottis: normal   Throat:    True vocal cords: normal   Comments:     On fiberoptic laryngoscopy the vocal cords were clear bilaterally with normal vocal cord mobility and widely patent glottic gap as well as subglottic region.  She did have mild arytenoid mucosal edema but no mucosal lesions noted otherwise.  This could be secondary to reflux symptoms.  Assessment: Minimal hoarseness in the office today with normal vocal cord examination on fiberoptic laryngoscopy. Mild rhinitis.  Plan: Reassured patient of normal vocal cord examination.  She may have some degree of reflux that may be contributing some to her raspy voice but there are not no specific vocal cord lesions noted. Recommended trying omeprazole 40 mg daily 4 to 5 hours prior to going to bed on a regular basis for the next 6 to  8 weeks. Also recommended use of Flonase 2 sprays each night prior to going to bed as this will help some with nasal congestion. She will follow-up as needed   Radene Journey, MD   CC:

## 2021-04-10 ENCOUNTER — Telehealth (HOSPITAL_COMMUNITY): Payer: Self-pay

## 2021-04-10 NOTE — Telephone Encounter (Signed)
Attempted to call patient in regards to Pulmonary Rehab - LM on VM °

## 2021-04-10 NOTE — Telephone Encounter (Signed)
Pt insurance is active and benefits verified through [employer] $35, DED 0/0 met, out of pocket $8,000/$1,555.06 met, co-insurance 0%. no pre-authorization required. Passport, 04/10/2021'@10' :51am, REF# [id number]   Will contact patient to see if she is interested in the Cardiac Rehab Program. If interested, patient will need to complete follow up appt. Once completed, patient will be contacted for scheduling upon review by the RN Navigator.

## 2021-04-13 ENCOUNTER — Telehealth (HOSPITAL_COMMUNITY): Payer: Self-pay | Admitting: *Deleted

## 2021-04-13 NOTE — Telephone Encounter (Signed)
Juni returned call from message left earlier by the support staff.  Reviewed insurance benefits and details regarding pulmonary rehab.  Pt is interested and wishes to proceed.  Advised of the wait list 2-3 months.  Aryss will need PM class as she works nights and sleeps during the day.  Also will need to be called for scheduling after 1. Cherre Huger, BSN Cardiac and Training and development officer

## 2021-04-14 ENCOUNTER — Other Ambulatory Visit: Payer: Self-pay

## 2021-04-14 ENCOUNTER — Ambulatory Visit (INDEPENDENT_AMBULATORY_CARE_PROVIDER_SITE_OTHER): Payer: 59

## 2021-04-14 ENCOUNTER — Encounter: Payer: Self-pay | Admitting: Podiatry

## 2021-04-14 ENCOUNTER — Ambulatory Visit (INDEPENDENT_AMBULATORY_CARE_PROVIDER_SITE_OTHER): Payer: 59 | Admitting: Podiatry

## 2021-04-14 ENCOUNTER — Other Ambulatory Visit: Payer: Self-pay | Admitting: Podiatry

## 2021-04-14 DIAGNOSIS — M778 Other enthesopathies, not elsewhere classified: Secondary | ICD-10-CM

## 2021-04-14 DIAGNOSIS — M7662 Achilles tendinitis, left leg: Secondary | ICD-10-CM | POA: Diagnosis not present

## 2021-04-14 DIAGNOSIS — M7661 Achilles tendinitis, right leg: Secondary | ICD-10-CM

## 2021-04-14 MED ORDER — MELOXICAM 15 MG PO TABS: 15.0000 mg | ORAL_TABLET | Freq: Every day | ORAL | 0 refills | Status: DC

## 2021-04-14 MED ORDER — DEXAMETHASONE SODIUM PHOSPHATE 120 MG/30ML IJ SOLN
4.0000 mg | Freq: Once | INTRAMUSCULAR | Status: AC
  Administered 2021-04-14: 4 mg via INTRA_ARTICULAR

## 2021-04-14 MED ORDER — METHYLPREDNISOLONE 4 MG PO TBPK: ORAL_TABLET | ORAL | 0 refills | Status: DC

## 2021-04-14 NOTE — Patient Instructions (Signed)
Achilles Tendinitis Rehab Ask your health care provider which exercises are safe for you. Do exercises exactly as told by your health care provider and adjust them as directed. It is normal to feel mild stretching, pulling, tightness, or discomfort as you do these exercises. Stop right away if you feel sudden pain or your pain gets worse. Do not begin these exercises until told by your health care provider. Stretching and range-of-motion exercises These exercises warm up your muscles and joints and improve the movement and flexibility of your ankle. These exercises also help to relieve pain. Standing wall calf stretch with straight knee  Stand with your hands against a wall. Extend your left / right leg behind you, and bend your front knee slightly. Keep both of your heels on the floor. Point the toes of your back foot slightly inward. Keeping your heels on the floor and your back knee straight, shift your weight toward the wall. Do not allow your back to arch. You should feel a gentle stretch in your upper calf. Hold this position for __________ seconds. Repeat __________ times. Complete this exercise __________ times a day. Standing wall calf stretch with bent knee Stand with your hands against a wall. Extend your left / right leg behind you, and bend your front knee slightly. Keep both of your heels on the floor. Point the toes of your back foot slightly inward. Keeping your heels on the floor, bend your back knee slightly. You should feel a gentle stretch deep in your lower calf near your heel. Hold this position for __________ seconds. Repeat __________ times. Complete this exercise __________ times a day. Strengthening exercises These exercises build strength and control of your ankle. Endurance is the ability to use your muscles for a long time, even after they get tired. Plantar flexion with band In this exercise, you push your toes downward, away from you, with an exercise band  providing resistance. Sit on the floor with your left / right leg extended. You may put a pillow under your calf to give your foot more room to move. Loop a rubber exercise band or tube around the ball of your left / right foot. The ball of your foot is on the walking surface, right under your toes. The band or tube should be slightly tense when your foot is relaxed. If the band or tube slips, you can put on your shoe or put a washcloth between the band and your foot to help it stay in place. Slowly point your toes downward, pushing them away from you (plantar flexion). Hold this position for __________ seconds. Slowly release the tension in the band or tube, controlling smoothly until your foot is back to the starting position. Repeat steps 1-5 with your left / right leg. Repeat __________ times. Complete this exercise __________ times a day. Eccentric heel drop In this exercise, you stand and slowly raise your heel and then slowly lower it. This exercise lengthens the calf muscles (eccentric) while the heel bears weight. If this exercise is too easy, try doing it while wearing a backpack with weights in it. Stand on a step with the balls of your feet. The ball of your foot is on the walking surface, right under your toes. Do not put your heels on the step. For balance, rest your hands on the wall or on a railing. Rise up onto the balls of your feet. Keeping your heels up, shift all of your weight to your left / right leg and  pick up your other leg. Slowly lower your left / right leg so your heel drops below the level of the step. Put down your other foot before returning to the start position. If told by your health care provider, build up to: 3 sets of 15 repetitions while keeping your knees straight. 3 sets of 15 repetitions while keeping your knees slightly bent as far as told by your health care provider. Repeat __________ times. Complete this exercise __________ times a day. Balance  exercises These exercises improve or maintain your balance. Balance is important in preventing falls. Single leg stand If this exercise is too easy, you can try it with your eyes closed or while standing on a pillow. Without shoes, stand near a railing or in a door frame. Hold on to the railing or door frame as needed. Stand on your left / right foot. Keep your big toe down on the floor and try to keep your arch lifted. Hold this position for __________ seconds. Repeat __________ times. Complete this exercise __________ times a day. This information is not intended to replace advice given to you by your health care provider. Make sure you discuss any questions you have with your health care provider. Document Revised: 09/02/2020 Document Reviewed: 09/02/2020 Elsevier Patient Education  2022 [place]. Achilles Tendinitis Achilles tendinitis is inflammation of the tough, cord-like band that attaches the lower leg muscles to the heel bone (Achilles tendon). This is usually caused by overusing the tendon and the ankle joint. Achilles tendinitis usually gets better over time with treatment and caring for yourself at home. It can take weeks or months to heal completely. What are the causes? This condition may be caused by: A sudden increase in exercise or activity, such as running. Doing the same exercises or activities, such as jumping, over and over. Not warming up calf muscles before exercising. Exercising in shoes that are worn out or not made for exercise. Having arthritis or a bone growth (spur) on the back of the heel bone. This can rub against the tendon and hurt it. Age-related wear and tear. Tendons become less flexible with age and are more likely to be injured. What are the signs or symptoms? Common symptoms of this condition include: Pain in the Achilles tendon or in the back of the leg, just above the heel. The pain usually gets worse with exercise. Stiffness or soreness in the  back of the leg, especially in the morning. Swelling of the skin over the Achilles tendon. Thickening of the tendon. Trouble standing on tiptoe. How is this diagnosed? This condition is diagnosed based on your symptoms and a physical exam. You may have tests, including: X-rays. MRI. How is this treated? The goal of treatment is to relieve symptoms and help your injury heal. Treatment may include: Decreasing or stopping activities that caused the tendinitis. This may mean switching to low-impact exercises like biking or swimming. Icing the injured area. Doing physical therapy, including strengthening and stretching exercises. Taking NSAIDs, such as ibuprofen, to help relieve pain and swelling. Using supportive shoes, wraps, heel lifts, or a walking boot (air cast). Having surgery. This may be done if your symptoms do not improve after other treatments. Using high-energy shock wave impulses to stimulate the healing process (extracorporeal shock wave therapy). This is rare. Having an injection of medicines that help relieve inflammation (corticosteroids). This is rare. Follow these instructions at home: If you have an air cast: Wear the air cast as told by your health  care provider. Remove it only as told by your health care provider. Loosen it if your toes tingle, become numb, or turn cold and blue. Keep it clean. If the air cast is not waterproof: Do not let it get wet. Cover it with a watertight covering when you take a bath or shower. Managing pain, stiffness, and swelling  If directed, put ice on the injured area. To do this: If you have a removable air cast, remove it as told by your health care provider. Put ice in a plastic bag. Place a towel between your skin and the bag. Leave the ice on for 20 minutes, 2-3 times a day. Move your toes often to reduce stiffness and swelling. Raise (elevate) your foot above the level of your heart while you are sitting or lying  down. Activity Gradually return to your normal activities as told by your health care provider. Ask your health care provider what activities are safe for you. Do not do activities that cause pain. Consider doing low-impact exercises, like cycling or swimming. Ask your health care provider when it is safe to drive if you have an air cast on your foot. If physical therapy was prescribed, do exercises as told by your health care provider or physical therapist. General instructions If directed, wrap your foot with an elastic bandage or other wrap. This can help to keep your tendon from moving too much while it heals. Your health care provider will show you how to wrap your foot correctly. Wear supportive shoes or heel lifts only as told by your health care provider. Take over-the-counter and prescription medicines only as told by your health care provider. Keep all follow-up visits as told by your health care provider. This is important. Contact a health care provider if you: Have symptoms that get worse. Have pain that does not get better with medicine. Develop new, unexplained symptoms. Develop warmth and swelling in your foot. Have a fever. Get help right away if you: Have a sudden popping sound or sensation in your Achilles tendon followed by severe pain. Cannot move your toes or foot. Cannot put any weight on your foot. Your foot or toes become numb and look white or blue even after loosening your bandage or air cast. Summary Achilles tendinitis is inflammation of the tough, cord-like band that attaches the lower leg muscles to the heel bone (Achilles tendon). This condition is usually caused by overusing the tendon and the ankle joint. It can also be caused by arthritis or normal aging. The most common symptoms of this condition include pain, swelling, or stiffness in the Achilles tendon or in the back of the leg. This condition is usually treated by decreasing or stopping activities  that caused the tendinitis, icing the injured area, taking NSAIDs, and doing physical therapy. This information is not intended to replace advice given to you by your health care provider. Make sure you discuss any questions you have with your health care provider. Document Revised: 11/27/2018 Document Reviewed: 11/27/2018 Elsevier Patient Education  [place].

## 2021-04-15 NOTE — Progress Notes (Signed)
Subjective:  Patient ID: Cassie [name], female    DOB: 02-18-1966,  MRN: [id number] HPI Chief Complaint  Patient presents with   Foot Pain    Bilateral heel pain- on and off since 2014- has tried everything OTC and seeing different doctors for this same issues. Theres been no improvement     55 y.o. female presents with the above complaint.   ROS: Denies fever chills nausea vomiting muscle aches pains calf pain back pain chest pain shortness of breath  Past Medical History:  Diagnosis Date   Hypertension    Hypothyroidism    Metabolic syndrome    Past Surgical History:  Procedure Laterality Date   DENTAL SURGERY      Current Outpatient Medications:    methylPREDNISolone (MEDROL DOSEPAK) 4 MG TBPK tablet, 6 day dose pack - take as directed, Disp: 21 tablet, Rfl: 0   albuterol (VENTOLIN HFA) 108 (90 Base) MCG/ACT inhaler, Inhale 1-2 puffs into the lungs every 6 (six) hours as needed for wheezing or shortness of breath., Disp: , Rfl:    fluticasone (FLONASE) 50 MCG/ACT nasal spray, Place 1 spray into both nostrils daily., Disp: , Rfl:    furosemide (LASIX) 20 MG tablet, Take 20 mg by mouth 2 (two) times daily., Disp: , Rfl:    labetalol (NORMODYNE) 300 MG tablet, Take 300 mg by mouth 2 (two) times daily., Disp: , Rfl:    meloxicam (MOBIC) 15 MG tablet, TAKE 1 TABLET(15 MG) BY MOUTH DAILY, Disp: 90 tablet, Rfl: 0   metFORMIN (GLUCOPHAGE) 500 MG tablet, Take by mouth 2 (two) times daily with a meal., Disp: , Rfl:   Allergies  Allergen Reactions   Amlodipine Swelling   Sulfa Antibiotics Rash   Review of Systems Objective:  There were no vitals filed for this visit.  General: Well developed, nourished, in no acute distress, alert and oriented x3   Dermatological: Skin is warm, dry and supple bilateral. Nails x 10 are well maintained; remaining integument appears unremarkable at this time. There are no open sores, no preulcerative lesions, no rash or signs of  infection present.  Vascular: Dorsalis Pedis artery and Posterior Tibial artery pedal pulses are 2/4 bilateral with immedate capillary fill time. Pedal hair growth present. No varicosities and no lower extremity edema present bilateral.   Neruologic: Grossly intact via light touch bilateral. Vibratory intact via tuning fork bilateral. Protective threshold with Semmes Wienstein monofilament intact to all pedal sites bilateral. Patellar and Achilles deep tendon reflexes 2+ bilateral. No Babinski or clonus noted bilateral.   Musculoskeletal: No gross boney pedal deformities bilateral. No pain, crepitus, or limitation noted with foot and ankle range of motion bilateral. Muscular strength 5/5 in all groups tested bilateral.  Pain on palpation posterior heel at the Achilles left greater than right some fluctuance beneath the skin most consistent with bursitis.  She does have a tight gastrosoleus complex.  Gait: Unassisted, Nonantalgic.    Radiographs:  Radiographs taken today demonstrate thickening of the Achilles retrocalcaneal spurring.  No acute findings soft tissue swelling over the posterior aspect of the heel consistent with a bursitis  Assessment & Plan:   Assessment: Insertional Achilles tendinitis retrocalcaneal spurring.  Retrocalcaneal bursitis.  Plan: Discussed etiology pathology conservative versus surgical therapy started her on methylprednisolone to be followed by meloxicam.  I injected the bursa today with 2 mg of dexamethasone and local anesthetic to each foot.  This was made sure not to inject into the tendon.  Were going to send her  to physical therapy.  Follow-up with her in about 1 month discussed appropriate shoe gear stretching exercise ice therapy and shoe gear modification.     Delynn Pursley T. [place], [place]

## 2021-04-21 ENCOUNTER — Ambulatory Visit (HOSPITAL_COMMUNITY): Payer: 59 | Attending: Internal Medicine

## 2021-04-21 ENCOUNTER — Other Ambulatory Visit: Payer: Self-pay

## 2021-04-21 DIAGNOSIS — J80 Acute respiratory distress syndrome: Secondary | ICD-10-CM

## 2021-04-21 DIAGNOSIS — U071 COVID-19: Secondary | ICD-10-CM | POA: Diagnosis present

## 2021-04-21 LAB — ECHOCARDIOGRAM COMPLETE
Area-P 1/2: 3.16 cm2
S' Lateral: 3 cm

## 2021-04-23 ENCOUNTER — Other Ambulatory Visit: Payer: Self-pay | Admitting: *Deleted

## 2021-04-23 DIAGNOSIS — I77810 Thoracic aortic ectasia: Secondary | ICD-10-CM

## 2021-04-23 DIAGNOSIS — I071 Rheumatic tricuspid insufficiency: Secondary | ICD-10-CM

## 2021-04-23 DIAGNOSIS — I509 Heart failure, unspecified: Secondary | ICD-10-CM

## 2021-04-23 DIAGNOSIS — I272 Pulmonary hypertension, unspecified: Secondary | ICD-10-CM

## 2021-04-23 NOTE — Progress Notes (Signed)
Cardiology consult placed.  Nothing further needed.

## 2021-04-28 ENCOUNTER — Ambulatory Visit: Payer: 59 | Attending: Podiatry

## 2021-04-28 ENCOUNTER — Other Ambulatory Visit: Payer: Self-pay

## 2021-04-28 VITALS — BP 180/100

## 2021-04-28 DIAGNOSIS — M25672 Stiffness of left ankle, not elsewhere classified: Secondary | ICD-10-CM | POA: Diagnosis present

## 2021-04-28 DIAGNOSIS — M79671 Pain in right foot: Secondary | ICD-10-CM | POA: Diagnosis present

## 2021-04-28 DIAGNOSIS — M25671 Stiffness of right ankle, not elsewhere classified: Secondary | ICD-10-CM | POA: Diagnosis present

## 2021-04-28 DIAGNOSIS — M79672 Pain in left foot: Secondary | ICD-10-CM | POA: Diagnosis not present

## 2021-04-28 NOTE — Therapy (Signed)
[place], [place], [address] Phone: [phone]   Fax:  [phone]  Physical Therapy Evaluation  Patient Details  Name: Cassie [name] MRN: [id number] Date of Birth: [birth year] Referring Provider (PT): [place], [place] T, [place]   Encounter Date: 04/28/2021   PT End of Session - 04/28/21 1802     Visit Number 1    Number of Visits 17    Date for PT Re-Evaluation 06/23/21    Authorization Type [employer]    PT Start Time 1620    PT Stop Time 1700    PT Time Calculation (min) 40 min    Activity Tolerance Patient tolerated treatment well    Behavior During Therapy [facility] for tasks assessed/performed             Past Medical History:  Diagnosis Date   Hypertension    Hypothyroidism    Metabolic syndrome     Past Surgical History:  Procedure Laterality Date   DENTAL SURGERY      Vitals:   04/28/21 1624  BP: (!) 180/100      Subjective Assessment - 04/28/21 1624     Subjective Pt presents to PT with reports of bilateral achilles pain and discomfort. This has been an ongoing issue for the last few years, made especially worse by increased activity levels. Pt notes that she has had recent decrease in pain after injections and steroid pack prescribed by DPM. She denies N/T in either LE and notes that she had started working out earlier this year to try and improve cardiovascular fitness, which in turn increased heel pain. She has future visit with cardiovascular for CHF, which she was unaware of this as a diagnosis.    Pertinent History chronic hx of bilat heel/achilles pain made worse with activity    Limitations Walking;Standing    How long can you sit comfortably? indefinite    How long can you stand comfortably? 60 min    How long can you walk comfortably? 5 min    Patient Stated Goals Pt would like management strategies for decreasing risk of bilat heel pain returning in the future    Currently  in Pain? No/denies    Pain Score --   10/10 at worst in last few weeks before corticosteroids   Pain Location Heel    Pain Orientation Right;Left    Pain Type Chronic pain    Pain Onset More than a month ago    Pain Frequency Constant    Aggravating Factors  increased activitylevel, walking, exercise    Pain Relieving Factors rest, medication                [facility] PT Assessment - 04/28/21 0001       Assessment   Medical Diagnosis M76.61,M76.62 (ICD-10-CM) - Achilles tendinitis of both lower extremities    Referring Provider (PT) Hyatt, Max T, DPM      Precautions   Precautions None      Restrictions   Weight Bearing Restrictions No      Balance Screen   Has the patient fallen in the past 6 months No    Has the patient had a decrease in activity level because of a fear of falling?  No    Is the patient reluctant to leave their home because of a fear of falling?  No      Home Environment   Living Environment Private residence    Type of [place]  Home Access Stairs to enter    Entrance Stairs-Number of Steps 1    Home Layout Two level    Alternate Level Stairs-Number of Steps 19    Alternate Level Stairs-Rails Right      Prior Function   Level of Independence Independent;Independent with basic ADLs      Cognition   Overall Cognitive Status Within Functional Limits for tasks assessed    Attention Focused      Observation/Other Assessments   Focus on Therapeutic Outcomes (FOTO)  43% funciton; 65% predicted      AROM   Right Ankle Dorsiflexion 10    Left Ankle Dorsiflexion 8      Palpation   Palpation comment slight discomfort with palpation to bilat achilles insertion sites                        Objective measurements completed on examination: See above findings.                PT Education - 04/28/21 1801     Education Details eval findings, BP readings and need to seek medical services, FOTO, POC    Person(s) Educated  Patient    Methods Explanation;Demonstration;Handout    Comprehension Verbalized understanding;Returned demonstration              PT Short Term Goals - 04/28/21 1811       PT SHORT TERM GOAL #1   Title Pt will be compliant and knowledgeable with initial HEP    Baseline stretching HEP given    Time 3    Period Weeks    Status New    Target Date 06/23/21      PT SHORT TERM GOAL #2   Title Pt will begin regular home BP checks for maintaining safety and best practice    Time 2    Period Weeks    Status New    Target Date 05/12/21               PT Long Term Goals - 04/28/21 1812       PT LONG TERM GOAL #1   Title Pt will improve FOTO to 65% function as proxy for functional improvement    Baseline 43% function    Time 8    Period Weeks    Status New    Target Date 06/23/21      PT LONG TERM GOAL #2   Title Pt will self report bilat heel pain no worse than 3/10 at worst for improved comfort and function    Baseline 10/10 at worst    Time 8    Period Weeks    Status New    Target Date 06/23/21      PT LONG TERM GOAL #3   Title Pt will improve bilat ankle DF to no less than 12 degrees for improved comfort    Baseline see flowsheet    Time 8    Period Weeks    Status New    Target Date 06/23/21                    Plan - 04/28/21 1814     Clinical Impression Statement Pt is a 55 y/o F who presents to PT with reports of chornic bilateral heel/achilles pain and discomfort. Physical findings are consistent with DPM impression, as pt has pain to palpation to bilat achilles insertion and decreased DF ROM bilaterally. Of concern, pt's BP  was very elevated today, with PT providing education on why this is potentially hazardous and to seek emergency services and contact PCP if home monitor is reading similar levels. Pt would benefit from skilled PT working on improving muscle length and strength of distal calf musculature, however, PT will continue to monitor  BP and other cardiovascular symptoms to determine safety of exercise and treatment.    Personal Factors and Comorbidities Comorbidity 3+;Fitness;Past/Current Experience    Comorbidities PMH: HTN, Hypothyroid, CHF    Examination-Activity Limitations Locomotion Level;Transfers;Squat;Stairs;Stand;Lift    Examination-Participation Restrictions Yard Work;Occupation;Cleaning;Community Activity    Stability/Clinical Decision Making Evolving/Moderate complexity    Clinical Decision Making Moderate    Rehab Potential Good    PT Frequency 2x / week    PT Duration 8 weeks    PT Treatment/Interventions ADLs/Self Care Home Management;Cryotherapy;Electrical Stimulation;Moist Heat;Traction;Iontophoresis 4mg /ml Dexamethasone;Gait training;Functional mobility training;Therapeutic activities;Therapeutic exercise;Balance training;Neuromuscular re-education;Stair training;Patient/family education;Manual techniques;Passive range of motion;Dry needling;Taping;Vasopneumatic Device    PT Next Visit Plan CHECK BP; assess response to stretching HEP, progress calf stretching and achilles eccentric loading as able    PT Home Exercise Plan Access Code: [email or web address]    Consulted and Agree with Plan of Care Patient             Patient will benefit from skilled therapeutic intervention in order to improve the following deficits and impairments:  Abnormal gait, Decreased activity tolerance, Cardiopulmonary status limiting activity, Decreased endurance, Decreased mobility, Decreased range of motion, Decreased strength, Pain  Visit Diagnosis: Pain in left foot - Plan: PT plan of care cert/re-cert  Pain in right foot - Plan: PT plan of care cert/re-cert  Stiffness of left ankle, not elsewhere classified - Plan: PT plan of care cert/re-cert  Stiffness of right ankle, not elsewhere classified - Plan: PT plan of care cert/re-cert     Problem List Patient Active Problem List   Diagnosis Date Noted   Restrictive lung  disease 03/10/2021   Chronic hoarseness 03/10/2021   Dyspnea 01/02/2021   Vitamin D deficiency 11/15/2018   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   Essential hypertension 10/24/2017   Acute respiratory distress syndrome (ARDS) ([place]) 09/10/2017   Debility 09/10/2017   Tachycardia    Tracheostomy in place [facility])    Oropharyngeal dysphagia    Morbid obesity (HCC)    Tachypnea    Reactive hypertension    Prediabetes    Hypokalemia    Acute blood loss anemia    Hyperglycemia 09/06/2017   Sinus tachycardia 09/06/2017   Hypernatremia 09/06/2017   Acute respiratory failure with hypoxemia (HCC)    Tracheostomy status (HCC)    Transaminitis 08/19/2017   Electrolyte imbalance 08/19/2017   Influenza B    ARDS (adult respiratory distress syndrome) ([place])    Influenza A 08/18/2017   Acute lung injury 08/18/2017   Multifocal pneumonia 08/17/2017   Acute respiratory failure ([place]) 08/17/2017   Sepsis ([place]) 08/17/2017   Hypothyroidism     Ward Chatters, PT 04/28/2021, 6:24 PM  [facility] [facility] [address] [place], [place], [address] Phone: [phone]   Fax:  [phone]  Name: Cassie [name] MRN: [id number] Date of Birth: 1966/01/18

## 2021-05-06 ENCOUNTER — Ambulatory Visit (INDEPENDENT_AMBULATORY_CARE_PROVIDER_SITE_OTHER): Payer: 59 | Admitting: Emergency Medicine

## 2021-05-06 ENCOUNTER — Encounter: Payer: Self-pay | Admitting: Emergency Medicine

## 2021-05-06 ENCOUNTER — Other Ambulatory Visit: Payer: Self-pay

## 2021-05-06 DIAGNOSIS — R490 Dysphonia: Secondary | ICD-10-CM | POA: Diagnosis not present

## 2021-05-06 DIAGNOSIS — R0602 Shortness of breath: Secondary | ICD-10-CM | POA: Diagnosis not present

## 2021-05-06 NOTE — Addendum Note (Signed)
Addended by: Gavin Potters R on: 05/06/2021 03:42 PM   Modules accepted: Orders

## 2021-05-06 NOTE — Progress Notes (Signed)
Subjective:    Patient ID: Cassie [name], female    DOB: 08-10-1965, 55 y.o.   MRN: [id number]  HPI 55 year old never smoker with a history of hypertension, hypothyroidism, . Referred today for hoarseness and SOB.  Has a history of a complicated critical care hospitalization with ARDS and chronic respiratory failure requiring tracheostomy due to influenza, multiorgan failure and acute renal failure.  She required tracheostomy.  She has been seen in our office before by Dr. Chase Caller following this hospitalization and for dyspnea, hoarseness. Of note she was being evaluated for IVF in 2019, was on fertility meds - she wonders if they may have impacted her breathing. She had COVID in 07/2020.   Today she describes SOB that can happen with some exertion, with bending. Has trouble with maintaining a conversation. She gained some wt during IVF, and post hospitalization, about 20 lbs. She can get raspy voice with allergy season.   CXR 11/28/2017 reviewed by me, shows no abnormalities.   ROV 05/06/21 --follow-up visit 55 year old woman for shortness of breath and hoarseness.  She had a critical care hospitalization for chronic respiratory failure with tracheostomy due to ARDS from influenza, multiorgan failure. We referred her to pulmonary rehab, hasn't been done yet Today she reports that her breathing remains limiting. Difficult to do exertion. She sits up to sleep. She was better when she lost 20 lbs, but has gained some of it back. No real cough, sputum. She does not believe she can do a sleep study. She is having increased nasal congestion, raspy voice  Pulmonary function testing reviewed by me from 03/06/2021 showed restricted spirometry and lung volumes.  No clear evidence for obstruction.  She had a decreased diffusion that corrects to the normal range when adjusted for alveolar volume.  No evidence on her flow-volume loop for fixed tracheal obstruction  Echocardiogram 04/21/2021 with  severe LVH and grade 3 diastolic dysfunction, estimated PASP 54 mmHg  High-resolution CT scan of the chest 03/17/2021 reviewed by me shows enlarged pulmonary artery, mild lobular air trapping on expiration but no evidence of interstitial lung disease  Laryngoscopy 04/06/2021 showed good vocal cord movement and a widely patent glottic gap.  She had some mild arytenoid mucosal edema   Review of Systems As per HPI  Past Medical History:  Diagnosis Date   Hypertension    Hypothyroidism    Metabolic syndrome      Family History  Problem Relation Age of Onset   Kidney disease Father    Diabetes Father    Breast cancer Neg Hx      Social History   Socioeconomic History   Marital status: Significant Other    Spouse name: Not on file   Number of children: Not on file   Years of education: Not on file   Highest education level: Not on file  Occupational History   Not on file  Tobacco Use   Smoking status: Never   Smokeless tobacco: Never  Vaping Use   Vaping Use: Never used  Substance and Sexual Activity   Alcohol use: No   Drug use: No   Sexual activity: Yes    Birth control/protection: None  Other Topics Concern   Not on file  Social History Narrative   Not on file   Social Determinants of Health   Financial Resource Strain: Not on file  Food Insecurity: Not on file  Transportation Needs: Not on file  Physical Activity: Not on file  Stress: Not on file  Social Connections: Not on file  Intimate Partner Violence: Not on file     Allergies  Allergen Reactions   Amlodipine Swelling   Sulfa Antibiotics Rash     Outpatient Medications Prior to Visit  Medication Sig Dispense Refill   albuterol (VENTOLIN HFA) 108 (90 Base) MCG/ACT inhaler Inhale 1-2 puffs into the lungs every 6 (six) hours as needed for wheezing or shortness of breath.     furosemide (LASIX) 20 MG tablet Take 20 mg by mouth 2 (two) times daily.     labetalol (NORMODYNE) 300 MG tablet Take 300 mg  by mouth 2 (two) times daily.     levothyroxine (SYNTHROID) 100 MCG tablet levothyroxine 100 mcg tablet  Take 1 tablet every day by oral route.     meloxicam (MOBIC) 15 MG tablet TAKE 1 TABLET(15 MG) BY MOUTH DAILY 90 tablet 0   metFORMIN (GLUCOPHAGE-XR) 500 MG 24 hr tablet SMARTSIG:2 Tablet(s) By Mouth Every Evening     NP THYROID 90 MG tablet Take 90 mg by mouth daily as needed.     fluticasone (FLONASE) 50 MCG/ACT nasal spray Place 1 spray into both nostrils daily. (Patient not taking: No sig reported)     methylPREDNISolone (MEDROL DOSEPAK) 4 MG TBPK tablet 6 day dose pack - take as directed (Patient not taking: No sig reported) 21 tablet 0   metFORMIN (GLUCOPHAGE) 500 MG tablet Take by mouth 2 (two) times daily with a meal.     omeprazole (PRILOSEC) 40 MG capsule SMARTSIG:1 Capsule(s) By Mouth Every Evening     No facility-administered medications prior to visit.         Objective:   Physical Exam Vitals:   05/06/21 1436  BP: (!) 144/86  Pulse: 74  Resp: 18  Temp: 98.2 F (36.8 C)  SpO2: 94%  Weight: 245 lb 12.8 oz (111.5 kg)  Height: 5\' 4"  (1.626 m)   Gen: Pleasant, obese woman, in no distress,  normal affect, shallow breaths but overall comfortable resp pattern  ENT: No lesions,  mouth clear,  oropharynx clear, no postnasal drip, strong voice  Neck: No JVD, no stridor  Lungs: No use of accessory muscles, no crackles or wheezing on normal respiration, no wheeze on forced expiration  Cardiovascular: RRR, heart sounds normal, no murmur or gallops, no peripheral edema  Musculoskeletal: No deformities, no cyanosis or clubbing  Neuro: alert, awake, non focal  Skin: Warm, no lesions or rash      Assessment & Plan:   Dyspnea Multifactorial dyspnea but principally due to restrictive lung disease with probable contribution of her secondary pulmonary hypertension.  Unfortunately she is not interested right now in getting a sleep study-claustrophobia is a large barrier.   We may be able to talk about this more in the future.  She does believe that she will improve with exercise and weight loss and I agree.  We will refer her back to cardiopulmonary rehab as she never was enrolled before.  She is also interested in possible assistance with weight loss and we will make that referral as well.  I do not think she needs to be on bronchodilator therapy right now.  Do not see any evidence of obstruction of her upper airway on spirometry post tracheostomy.  No evidence of interstitial lung disease.  Chronic hoarseness She was given a GERD medication by ENT, is about to start it.  I think we need to get back on a more aggressive chronic rhinitis regimen.  Add back nasal steroid,  nasal saline, add loratadine.  Baltazar Apo, MD, PhD 05/06/2021, 3:02 PM [facility] [phone] or if no answer before 7:00PM call [phone] For any issues after 7:00PM please call eLink [phone]

## 2021-05-06 NOTE — Patient Instructions (Addendum)
We will refer you to cardiopulmonary rehab to build your breathing and stamina Please start loratadine 10 mg once daily Please restart your fluticasone nasal spray, 2 sprays each nostril once daily. You can try using nasal saline spray to help keep your nose from being too dry and obstructed We will hold off on obtaining a sleep study for now.  We may decide to rediscuss in the future. We will make a referral for assistance with medical weight management. Follow with Dr Lamonte Sakai in 4 months or sooner if you have any problems.

## 2021-05-06 NOTE — Assessment & Plan Note (Signed)
Multifactorial dyspnea but principally due to restrictive lung disease with probable contribution of her secondary pulmonary hypertension.  Unfortunately she is not interested right now in getting a sleep study-claustrophobia is a large barrier.  We may be able to talk about this more in the future.  She does believe that she will improve with exercise and weight loss and I agree.  We will refer her back to cardiopulmonary rehab as she never was enrolled before.  She is also interested in possible assistance with weight loss and we will make that referral as well.  I do not think she needs to be on bronchodilator therapy right now.  Do not see any evidence of obstruction of her upper airway on spirometry post tracheostomy.  No evidence of interstitial lung disease.

## 2021-05-06 NOTE — Assessment & Plan Note (Signed)
She was given a GERD medication by ENT, is about to start it.  I think we need to get back on a more aggressive chronic rhinitis regimen.  Add back nasal steroid, nasal saline, add loratadine.

## 2021-05-11 ENCOUNTER — Ambulatory Visit: Payer: 59

## 2021-05-14 ENCOUNTER — Ambulatory Visit: Payer: 59 | Admitting: Podiatry

## 2021-05-18 ENCOUNTER — Telehealth: Payer: Self-pay | Admitting: Emergency Medicine

## 2021-05-19 ENCOUNTER — Ambulatory Visit: Payer: 59

## 2021-05-19 NOTE — Telephone Encounter (Signed)
Lm for patient.  

## 2021-05-26 ENCOUNTER — Other Ambulatory Visit: Payer: Self-pay

## 2021-05-26 ENCOUNTER — Ambulatory Visit: Payer: 59 | Attending: Podiatry

## 2021-05-26 VITALS — BP 167/106

## 2021-05-26 DIAGNOSIS — M25672 Stiffness of left ankle, not elsewhere classified: Secondary | ICD-10-CM | POA: Diagnosis present

## 2021-05-26 DIAGNOSIS — M25671 Stiffness of right ankle, not elsewhere classified: Secondary | ICD-10-CM | POA: Insufficient documentation

## 2021-05-26 DIAGNOSIS — M79671 Pain in right foot: Secondary | ICD-10-CM | POA: Diagnosis present

## 2021-05-26 DIAGNOSIS — M79672 Pain in left foot: Secondary | ICD-10-CM | POA: Insufficient documentation

## 2021-05-26 NOTE — Therapy (Addendum)
[name], [place], [address] Phone: [phone]   Fax:  [phone]  Physical Therapy Treatment/Discharge  Patient Details  Name: Cassie [name] MRN: [id number] Date of Birth: 02/26/1966 Referring Provider (PT): [place], [place] T, [place]   Encounter Date: 05/26/2021   PT End of Session - 05/26/21 1355     Visit Number 2    Number of Visits 17    Date for PT Re-Evaluation 06/23/21    Authorization Type [employer]    PT Start Time 1400    PT Stop Time 1441    PT Time Calculation (min) 41 min    Activity Tolerance Patient tolerated treatment well    Behavior During Therapy [facility] for tasks assessed/performed             Past Medical History:  Diagnosis Date   Hypertension    Hypothyroidism    Metabolic syndrome     Past Surgical History:  Procedure Laterality Date   DENTAL SURGERY      Vitals:   05/26/21 1355  BP: (!) 167/106     Subjective Assessment - 05/26/21 1355     Subjective Pt presents to PT with reports of burning sensation in lateral L foot. Had ran out of medication which increased her pain, now that she is back on her pain has decreased. Pt is ready to begin PT treatment at this time.    Currently in Pain? Yes    Pain Score 4     Pain Location Foot    Pain Orientation Left           [facility] Adult PT Treatment/Exercise:   Therapeutic Exercise:  Towel scrunches x 60 sec  Seated heel raise x 20  Ankle 4-way 2x10 yellow tband Standing calf stretch on slantboard 3x30 sec Seated calf stretch 2x30 sec  Neuromuscular Re-Ed:  SLS 2x30 sec ea                               PT Short Term Goals - 04/28/21 1811       PT SHORT TERM GOAL #1   Title Pt will be compliant and knowledgeable with initial HEP    Baseline stretching HEP given    Time 3    Period Weeks    Status New    Target Date 06/23/21      PT SHORT TERM GOAL #2   Title Pt will begin  regular home BP checks for maintaining safety and best practice    Time 2    Period Weeks    Status New    Target Date 05/12/21               PT Long Term Goals - 04/28/21 1812       PT LONG TERM GOAL #1   Title Pt will improve FOTO to 65% function as proxy for functional improvement    Baseline 43% function    Time 8    Period Weeks    Status New    Target Date 06/23/21      PT LONG TERM GOAL #2   Title Pt will self report bilat heel pain no worse than 3/10 at worst for improved comfort and function    Baseline 10/10 at worst    Time 8    Period Weeks    Status New    Target Date 06/23/21  PT LONG TERM GOAL #3   Title Pt will improve bilat ankle DF to no less than 12 degrees for improved comfort    Baseline see flowsheet    Time 8    Period Weeks    Status New    Target Date 06/23/21                   Plan - 05/26/21 1406     Clinical Impression Statement Pt was able to complete prescribed exercises with no adverse effect. Today's session focused on continued calf stretching and distal LE strengthening. Pt did have notable swelling in bilateral LEs, she is also medication to reduce this as of one week. She notes improvement in symptoms after standing calf stretch on slant board. She continues to benefit from skilled PT and will continue to be seen and progressed as tolerated.    PT Treatment/Interventions ADLs/Self Care Home Management;Cryotherapy;Electrical Stimulation;Moist Heat;Traction;Iontophoresis 4mg /ml Dexamethasone;Gait training;Functional mobility training;Therapeutic activities;Therapeutic exercise;Balance training;Neuromuscular re-education;Stair training;Patient/family education;Manual techniques;Passive range of motion;Dry needling;Taping;Vasopneumatic Device    PT Next Visit Plan CHECK BP; assess response to stretching HEP, progress calf stretching and achilles eccentric loading as able    PT Home Exercise Plan Access Code: [email or web address]              Patient will benefit from skilled therapeutic intervention in order to improve the following deficits and impairments:  Abnormal gait, Decreased activity tolerance, Cardiopulmonary status limiting activity, Decreased endurance, Decreased mobility, Decreased range of motion, Decreased strength, Pain  Visit Diagnosis: Pain in left foot  Pain in right foot  Stiffness of left ankle, not elsewhere classified  Stiffness of right ankle, not elsewhere classified     Problem List Patient Active Problem List   Diagnosis Date Noted   Restrictive lung disease 03/10/2021   Chronic hoarseness 03/10/2021   Dyspnea 01/02/2021   Vitamin D deficiency 11/15/2018   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   Essential hypertension 10/24/2017   Acute respiratory distress syndrome (ARDS) ([place]) 09/10/2017   Debility 09/10/2017   Tachycardia    Tracheostomy in place [facility])    Oropharyngeal dysphagia    Morbid obesity ([place])    Tachypnea    Reactive hypertension    Prediabetes    Hypokalemia    Acute blood loss anemia    Hyperglycemia 09/06/2017   Sinus tachycardia 09/06/2017   Hypernatremia 09/06/2017   Acute respiratory failure with hypoxemia ([place])    Tracheostomy status ([place])    Transaminitis 08/19/2017   Electrolyte imbalance 08/19/2017   Influenza B    ARDS (adult respiratory distress syndrome) ([place])    Influenza A 08/18/2017   Acute lung injury 08/18/2017   Multifocal pneumonia 08/17/2017   Acute respiratory failure ([place]) 08/17/2017   Sepsis ([place]) 08/17/2017   Hypothyroidism     Ward Chatters, PT 05/26/2021, 2:42 PM  [facility] [address] [place], [place], [address] Phone: [phone]   Fax:  [phone]  Name: Cassie [name] MRN: [id number] Date of Birth: 1965-11-02  PHYSICAL THERAPY DISCHARGE SUMMARY  Visits from Start of Care: 2  Current functional level related to goals / functional  outcomes: Unable to assess   Remaining deficits: Unable to assess   Education / Equipment: N/A   Patient agrees to discharge. Patient goals were  unable to assess . Patient is being discharged due to  change in medical status, starting cardiac/pulmonary rehab.

## 2021-05-29 ENCOUNTER — Telehealth (HOSPITAL_COMMUNITY): Payer: Self-pay

## 2021-05-29 ENCOUNTER — Encounter (HOSPITAL_COMMUNITY): Payer: Self-pay

## 2021-05-29 NOTE — Telephone Encounter (Signed)
Attempted to call patient in regards to Pulmonary Rehab - LM on VM Mailed letter 

## 2021-06-02 ENCOUNTER — Ambulatory Visit: Payer: 59

## 2021-06-02 ENCOUNTER — Ambulatory Visit: Payer: 59 | Admitting: Podiatry

## 2021-06-02 ENCOUNTER — Telehealth: Payer: Self-pay

## 2021-06-02 NOTE — Telephone Encounter (Signed)
PT called and left voicemail regarding missed visit. Also noted that since she is beginning cardiac/pulmonary rehab, it is best to hold from PT services at this time until she has completed course for cardiopulmonary impairments.  Ward Chatters, PT, DPT 06/02/21 2:43 PM

## 2021-06-03 NOTE — Progress Notes (Signed)
Office Visit    Patient Name: Cassie [name] Date of Encounter: 06/04/2021  PCP:  Cassie Nip, MD   [place]  Cardiologist:  Larae Grooms, MD  Advanced Practice Provider:  No care team member to display Electrophysiologist:  None  [id number]}  Chief Complaint    Cassie [name] is a 55 y.o. female with a hx of hx of hypertension, hypothyroidism, and metabolic syndrome last seen in January 2020 by Dr. 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  Past Medical History    Past Medical History:  Diagnosis Date   Hypertension    Hypothyroidism    Metabolic syndrome    Past Surgical History:  Procedure Laterality Date   DENTAL SURGERY      Allergies  Allergies  Allergen Reactions   Amlodipine Swelling   Prednisone Hives    Worsening hives each time she takes   Sulfa Antibiotics Rash    History of Present Illness    Cassie [name] is a 55 y.o. female with a hx of hypertension, hypothyroidism, and metabolic syndrome last seen in January 2020 by Dr. Irish [name] for an abnormal EKG.  It was decided at that time that her abnormal EKG was most consistent with LVH.  Her last echocardiogram was January 2019 and showed no structural heart disease.  There was no further testing needed at this time.  We also discussed minimizing sodium in her diet and increasing her exercise.  Her LDL back in September 2019 was 124 and she was prescribed atorvastatin but never started taking this medication.  She was asked to follow-up as needed at this time.  She had an echocardiogram performed on 04/21/2021 which revealed severe left ventricular hypertrophy, grade 3 diastolic dysfunction (restrictive), a circumferential pericardial effusion which is trivial, mild mitral valve regurgitation, mild to  moderate tricuspid valve regurgitation, and mild dilatation of the ascending aorta measuring 41 mm.  For this reason, she was referred back to cardiology.  Today, She has several complaints today.  She was recently given oral steroids by her podiatrist for her Achilles tendon pain and this had caused her a lower extremity rash, itching, and edema.  This is slowly resolving and appears to be healing today on exam.  She is scheduled to start pulmonary rehab at the end of the month and she hopes that this will help her with her loss of voice.  She sees pulmonology and they did some pulmonary function tests.  She feels that her fluid retention is related to her course of steroids.  She has not been able to be very active due to her Achilles tendon pain but once it is healed and she plans to participate in a more routine exercise program.  She plans to focus on a low-sodium diet.  She discussed her loss of voice with her PCP and they suggested a reflux medication.  She also states that the dryness in her nose, throat and mouth are a concern.  I suggested focusing on water intake and limiting her coffee and tea intake throughout the day.  Also, lozenges or gum can help lubricate her mucous membranes.  She started Ozempic yesterday which she hopes will expedite her weight loss.  Reports no shortness of breath nor dyspnea on exertion. Reports no chest pain, pressure, or tightness. Positive for orthopnea.  Reports no palpitations.  EKGs/Labs/Other Studies Reviewed:   The following studies were reviewed today:  Echocardiogram 04/21/2021  IMPRESSIONS     1. Left ventricular ejection fraction, by estimation, is 60 to 65%. The  left ventricle has normal function. The left ventricle has no regional  wall motion abnormalities. There is severe left ventricular hypertrophy.  Left ventricular diastolic parameters   are consistent with Grade III diastolic dysfunction (restrictive).   2. Right ventricular systolic  function is normal. The right ventricular  size is normal. There is moderately elevated pulmonary artery systolic  pressure. The estimated right ventricular systolic pressure is 32.6 mmHg.   3. The pericardial effusion is circumferential but trivial.   4. The mitral valve is grossly normal. Mild mitral valve regurgitation.   5. Tricuspid valve regurgitation is mild to moderate.   6. The aortic valve was not well visualized. Aortic valve regurgitation  is not visualized. No aortic stenosis is present.   7. Aortic dilatation noted. There is mild dilatation of the ascending  aorta, measuring 41 mm.   8. The inferior vena cava is dilated in size with >50% respiratory  variability, suggesting right atrial pressure of 8 mmHg.   Comparison(s): A prior study was performed on 08/19/2017. Aortic root  dilation is new; TR and RVSP increase are new.   FINDINGS   Left Ventricle: Left ventricular ejection fraction, by estimation, is 60  to 65%. The left ventricle has normal function. The left ventricle has no  regional wall motion abnormalities. The left ventricular internal cavity  size was normal in size. There is   severe left ventricular hypertrophy. Left ventricular diastolic  parameters are consistent with Grade III diastolic dysfunction  (restrictive).   Right Ventricle: The right ventricular size is normal. No increase in  right ventricular wall thickness. Right ventricular systolic function is  normal. There is moderately elevated pulmonary artery systolic pressure.  The tricuspid regurgitant velocity is  3.39 m/s, and with an assumed right atrial pressure of 8 mmHg, the  estimated right ventricular systolic pressure is 71.2 mmHg.   Left Atrium: Left atrial size was normal in size.   Right Atrium: Right atrial size was normal in size.   Pericardium: Trivial pericardial effusion is present. The pericardial  effusion is circumferential.   Mitral Valve: The mitral valve is grossly  normal. Mild mitral valve  regurgitation.   Tricuspid Valve: The tricuspid valve is grossly normal. Tricuspid valve  regurgitation is mild to moderate. No evidence of tricuspid stenosis.   Aortic Valve: The aortic valve was not well visualized. Aortic valve  regurgitation is not visualized. No aortic stenosis is present.   Pulmonic Valve: The pulmonic valve was not well visualized. Pulmonic valve  regurgitation is mild.   Aorta: The aortic root is normal in size and structure and aortic  dilatation noted. There is mild dilatation of the ascending aorta,  measuring 41 mm.   Venous: The inferior vena cava is dilated in size with greater than 50%  respiratory variability, suggesting right atrial pressure of 8 mmHg.   IAS/Shunts: The atrial septum is grossly normal.   Narrative & Impression CLINICAL DATA:  History of ARDS, COVID x2, evaluate for interstitial lung disease   EXAM: CT CHEST WITHOUT CONTRAST   TECHNIQUE: Multidetector CT imaging of the chest was performed following the standard protocol without intravenous contrast. High resolution imaging of the lungs, as well as inspiratory and expiratory imaging, was performed.   COMPARISON:  None.   FINDINGS: Cardiovascular: No significant vascular findings.  Cardiomegaly. Enlargement of the main pulmonary artery measuring up to 4.4 cm in caliber. No pericardial effusion.   Mediastinum/Nodes: Prominent, although not pathologically enlarged mediastinal and hilar lymph nodes. Thyroid gland, trachea, and esophagus demonstrate no significant findings.   Lungs/Pleura: Mild, lobular air trapping on expiratory phase imaging. No pleural effusion or pneumothorax.   Upper Abdomen: No acute abnormality.   Musculoskeletal: No chest wall mass or suspicious bone lesions identified.   IMPRESSION: 1. No evidence of fibrotic interstitial lung disease. 2. Mild, lobular air trapping on expiratory phase imaging, suggestive of small  airways disease. 3. Cardiomegaly. 4. Enlargement of the main pulmonary artery measuring up to 4.4 cm in caliber, as can be seen in pulmonary hypertension.     Electronically Signed   By: Eddie Candle M.D.   On: 03/18/2021 14:19   Recent Labs: No results found for requested labs within last 8760 hours.  Recent Lipid Panel    Component Value Date/Time   CHOL 191 04/06/2018 1640   TRIG 181 (H) 04/06/2018 1640   HDL 31 (L) 04/06/2018 1640   CHOLHDL 6.2 (H) 04/06/2018 1640   LDLCALC 124 (H) 04/06/2018 1640    Home Medications   Current Meds  Medication Sig   albuterol (VENTOLIN HFA) 108 (90 Base) MCG/ACT inhaler Inhale 1-2 puffs into the lungs every 6 (six) hours as needed for wheezing or shortness of breath.   fluticasone (FLONASE) 50 MCG/ACT nasal spray Place 1 spray into both nostrils daily.   furosemide (LASIX) 40 MG tablet Take 1 tablet (40 mg total) by mouth 2 (two) times daily.   labetalol (NORMODYNE) 300 MG tablet Take 300 mg by mouth 2 (two) times daily.   meloxicam (MOBIC) 15 MG tablet TAKE 1 TABLET(15 MG) BY MOUTH DAILY   NP THYROID 90 MG tablet Take 90 mg by mouth daily as needed.   Semaglutide,0.25 or 0.5MG /DOS, (OZEMPIC, 0.25 OR 0.5 MG/DOSE,) 2 MG/1.5ML SOPN Inject 10 mg into the skin.   [DISCONTINUED] furosemide (LASIX) 20 MG tablet Take 20 mg by mouth 2 (two) times daily.     Review of Systems    All other systems reviewed and are otherwise negative except as noted above.  Physical Exam    VS:  BP 138/80   Pulse 71   Ht 5\' 4"  (1.626 m)   Wt 251 lb 12.8 oz (114.2 kg)   SpO2 94%   BMI 43.22 kg/m  , BMI Body mass index is 43.22 kg/m.  Wt Readings from Last 3 Encounters:  06/04/21 251 lb 12.8 oz (114.2 kg)  05/06/21 245 lb 12.8 oz (111.5 kg)  03/10/21 234 lb 6.4 oz (106.3 kg)     GEN: Well nourished, well developed, in no acute distress. HEENT: normal. Cardiac: RRR, no murmurs, rubs, or gallops. No clubbing, cyanosis, 1-2+ pitting pedal edema  Radials/PT 2+ and equal bilaterally.  Respiratory:  Respirations regular and unlabored, clear to auscultation bilaterally. GI: Soft, nontender, nondistended. MS: No deformity or atrophy. Skin: Warm and dry, no rash. Neuro:  Strength and sensation are intact. Psych: Normal affect.  Assessment & Plan    Severe left ventricular hypertrophy-Diuretics adjusted as below. Peruse optimal BP and fluid status management.    Grade 3 diastolic dysfunction (restrictive)/HFpEF/mild to moderate TR-we will increase her diuretic regimen to Lasix 40 mg twice a day with rechecking her BMP and BNP now and at the 1 week mark.  We will plan to add a potassium supplementation if less than 4.0. Plan to repeat echocardiogram  in 6 months to 1 year or if fluid status remains an issue potentially consider right heart cath.  New mild aortic dilatation of the ascending aorta measuring 41 mm-repeat imaging in 1 year with either echocardiogram or CTA.  Hypertension-continue labetalol 300 mg twice daily.  Discussed a low-sodium diet and to monitor her blood pressure at home.  Would recommend a automatic arm cuff over a wrist cuff for more accurate measurements.  Hypothyroidism-continue current medication regimen. Follow-up with PCP  Obesity- started on Ozempic yesterday. Activity somewhat limited due to her Achilles tendon.  We discussed a low-sodium diet and eventually getting into a structured exercise program.  She is involved with physical therapy for her Achilles tendon currently and plans to participate in pulmonary rehab at the end of the month.  Disposition: Follow up in 3 week(s) with Larae Grooms, MD or APP.  Signed, Elgie Collard, PA-C 06/04/2021, 4:43 PM [facility]

## 2021-06-04 ENCOUNTER — Other Ambulatory Visit: Payer: Self-pay

## 2021-06-04 ENCOUNTER — Ambulatory Visit (INDEPENDENT_AMBULATORY_CARE_PROVIDER_SITE_OTHER): Payer: 59 | Admitting: Physician Assistant

## 2021-06-04 ENCOUNTER — Encounter (HOSPITAL_BASED_OUTPATIENT_CLINIC_OR_DEPARTMENT_OTHER): Payer: Self-pay | Admitting: Physician Assistant

## 2021-06-04 VITALS — BP 138/80 | HR 71 | Ht 64.0 in | Wt 251.8 lb

## 2021-06-04 DIAGNOSIS — I5030 Unspecified diastolic (congestive) heart failure: Secondary | ICD-10-CM | POA: Diagnosis not present

## 2021-06-04 DIAGNOSIS — R600 Localized edema: Secondary | ICD-10-CM

## 2021-06-04 DIAGNOSIS — I10 Essential (primary) hypertension: Secondary | ICD-10-CM | POA: Diagnosis not present

## 2021-06-04 DIAGNOSIS — E039 Hypothyroidism, unspecified: Secondary | ICD-10-CM

## 2021-06-04 DIAGNOSIS — E118 Type 2 diabetes mellitus with unspecified complications: Secondary | ICD-10-CM

## 2021-06-04 MED ORDER — FUROSEMIDE 40 MG PO TABS: 40.0000 mg | ORAL_TABLET | Freq: Two times a day (BID) | ORAL | 3 refills | Status: DC

## 2021-06-04 NOTE — Patient Instructions (Signed)
Medication Instructions:  Your physician has recommended you make the following change in your medication: Change: Lasix 40mg  two times daily   *If you need a refill on your cardiac medications before your next appointment, please call your pharmacy*   Lab Work: Your physician recommends that you return for lab work in: Today: BMP,BNP  1 week: BMP, BNP  If you have labs (blood work) drawn today and your tests are completely normal, you will receive your results only by: [place] (if you have MyChart) OR A paper copy in the mail If you have any lab test that is abnormal or we need to change your treatment, we will call you to review the results.   Testing/Procedures: None today    Follow-Up: At [facility], you and your health needs are our priority.  As part of our continuing mission to provide you with exceptional heart care, we have created designated Provider Care Teams.  These Care Teams include your primary Cardiologist (physician) and Advanced Practice Providers (APPs -  Physician Assistants and Nurse Practitioners) who all work together to provide you with the care you need, when you need it.  We recommend signing up for the patient portal called "MyChart".  Sign up information is provided on this After Visit Summary.  MyChart is used to connect with patients for Virtual Visits (Telemedicine).  Patients are able to view lab/test results, encounter notes, upcoming appointments, etc.  Non-urgent messages can be sent to your provider as well.   To learn more about what you can do with MyChart, go to [email or web address].    Your next appointment:   2-3 week(s)  The format for your next appointment:   In Person  Provider:   Larae Grooms, MD  or APP   Other Instructions Recommend weighing daily and keeping a log. Please call our office if you have weight gain of 2 pounds overnight or 5 pounds in 1 week.    Heart Healthy Diet Recommendations: A low-salt  diet is recommended. Meats should be grilled, baked, or boiled. Avoid fried foods. Focus on lean protein sources like fish or chicken with vegetables and fruits. The American Heart Association is a Microbiologist!  American Heart Association Diet and Lifeystyle Recommendations    Exercise recommendations: The American Heart Association recommends 150 minutes of moderate intensity exercise weekly. Try 30 minutes of moderate intensity exercise 4-5 times per week. This could include walking, jogging, or swimming.

## 2021-06-05 LAB — BASIC METABOLIC PANEL
BUN/Creatinine Ratio: 17 (ref 9–23)
BUN: 15 mg/dL (ref 6–24)
CO2: 29 mmol/L (ref 20–29)
Calcium: 9.7 mg/dL (ref 8.7–10.2)
Chloride: 105 mmol/L (ref 96–106)
Creatinine, Ser: 0.86 mg/dL (ref 0.57–1.00)
Glucose: 89 mg/dL (ref 70–99)
Potassium: 3.7 mmol/L (ref 3.5–5.2)
Sodium: 147 mmol/L — ABNORMAL HIGH (ref 134–144)
eGFR: 79 mL/min/{1.73_m2} (ref >59)

## 2021-06-05 LAB — BRAIN NATRIURETIC PEPTIDE: BNP: 144.3 pg/mL — ABNORMAL HIGH (ref 0.0–100.0)

## 2021-06-08 NOTE — Progress Notes (Signed)
Attempted to call pt. To discuss lab results and need for new medication. Left VM for pt. 2CB

## 2021-06-09 ENCOUNTER — Telehealth: Payer: Self-pay | Admitting: Family

## 2021-06-09 MED ORDER — POTASSIUM CHLORIDE CRYS ER 20 MEQ PO TBCR: 20.0000 meq | EXTENDED_RELEASE_TABLET | Freq: Every day | ORAL | 3 refills | Status: DC

## 2021-06-09 NOTE — Telephone Encounter (Signed)
Cassie Dubonnet, NP  06/05/2021  6:55 PM EST Back to Top    BNP with mild volume overload. Expected based on physical exam. Normal kidney function. To prevent low potassium with diuretic use, recommend addition of Potassium 11mEq daily. Repeat labs already scheduled at clinic visit.    Please call patient to discuss.    Spoke with patient about lab results. Rx sent to [facility] for K+ 12mEq daily. She is aware to repeat labs end of this week, per [place] PA note

## 2021-06-09 NOTE — Telephone Encounter (Signed)
Cassie [name] is calling for her lab results.

## 2021-06-16 ENCOUNTER — Telehealth (HOSPITAL_COMMUNITY): Payer: Self-pay | Admitting: *Deleted

## 2021-06-16 LAB — BASIC METABOLIC PANEL
BUN/Creatinine Ratio: 13 (ref 9–23)
BUN: 13 mg/dL (ref 6–24)
CO2: 30 mmol/L — ABNORMAL HIGH (ref 20–29)
Calcium: 9.6 mg/dL (ref 8.7–10.2)
Chloride: 102 mmol/L (ref 96–106)
Creatinine, Ser: 0.97 mg/dL (ref 0.57–1.00)
Glucose: 100 mg/dL — ABNORMAL HIGH (ref 70–99)
Potassium: 4 mmol/L (ref 3.5–5.2)
Sodium: 145 mmol/L — ABNORMAL HIGH (ref 134–144)
eGFR: 69 mL/min/{1.73_m2} (ref >59)

## 2021-06-16 NOTE — Telephone Encounter (Signed)
Attempted to call pt but unable to reach and unable to leave VM due to mailbox being full. Will try to call back later. 

## 2021-06-17 LAB — BRAIN NATRIURETIC PEPTIDE: BNP: 118.7 pg/mL — ABNORMAL HIGH (ref 0.0–100.0)

## 2021-06-22 ENCOUNTER — Telehealth (HOSPITAL_COMMUNITY): Payer: Self-pay | Admitting: Family Medicine

## 2021-06-22 ENCOUNTER — Encounter (HOSPITAL_BASED_OUTPATIENT_CLINIC_OR_DEPARTMENT_OTHER): Payer: Self-pay

## 2021-06-22 ENCOUNTER — Ambulatory Visit (HOSPITAL_COMMUNITY): Payer: 59

## 2021-06-23 NOTE — Telephone Encounter (Signed)
Pt has been called twice without a return call. Pt has since seen cardiology on 06/04/21.

## 2021-06-24 NOTE — Telephone Encounter (Signed)
Pt called to get reschedule. For PR, adv pt Dec schedule is full. And that I can put her on a wait list if someone cancel for December. If no one cancel we will contact her once our January schedule opens up.

## 2021-06-25 ENCOUNTER — Ambulatory Visit (INDEPENDENT_AMBULATORY_CARE_PROVIDER_SITE_OTHER): Payer: 59 | Admitting: Podiatry

## 2021-06-25 ENCOUNTER — Other Ambulatory Visit: Payer: Self-pay

## 2021-06-25 ENCOUNTER — Encounter: Payer: Self-pay | Admitting: Podiatry

## 2021-06-25 DIAGNOSIS — M7662 Achilles tendinitis, left leg: Secondary | ICD-10-CM

## 2021-06-25 DIAGNOSIS — G5792 Unspecified mononeuropathy of left lower limb: Secondary | ICD-10-CM

## 2021-06-25 MED ORDER — MELOXICAM 15 MG PO TABS: 15.0000 mg | ORAL_TABLET | Freq: Every day | ORAL | 3 refills | Status: DC

## 2021-06-25 MED ORDER — DEXAMETHASONE SODIUM PHOSPHATE 120 MG/30ML IJ SOLN
4.0000 mg | Freq: Once | INTRAMUSCULAR | Status: AC
  Administered 2021-06-25: 4 mg via INTRA_ARTICULAR

## 2021-06-25 NOTE — Progress Notes (Signed)
She presents today for follow-up of her Achilles tendinitis states that she is 75 to 85% improved of the right foot is doing great the left one is still mildly tender she gets a burning sensation right here she points to the posterior inferior aspect of the malleolar region.  Objective: Vital signs are stable she is alert and orient x3 no reproducible pain on palpation of the right foot however the left foot does demonstrate pain on palpation of the sural nerve just inferior and posterior to the lateral malleolar region she also has a palpable bursitis to the posterior aspect of the Achilles.   Assessment: Resolution of Achilles tendinitis right moderate Achilles tendinitis with insertional bursitis left and neuritis sural nerve left.  Plan: 2 mg of dexamethasone was injected into the bursa.  1 mg dexamethasone was injected to the point of maximal tenderness at sural nerve.  And we refilled her meloxicam.  I will follow-up with her in about 2 months

## 2021-06-26 ENCOUNTER — Ambulatory Visit (HOSPITAL_BASED_OUTPATIENT_CLINIC_OR_DEPARTMENT_OTHER): Payer: 59 | Admitting: Family

## 2021-06-30 ENCOUNTER — Ambulatory Visit (HOSPITAL_COMMUNITY): Payer: 59

## 2021-07-02 ENCOUNTER — Ambulatory Visit (HOSPITAL_COMMUNITY): Payer: 59

## 2021-07-06 ENCOUNTER — Encounter (HOSPITAL_BASED_OUTPATIENT_CLINIC_OR_DEPARTMENT_OTHER): Payer: Self-pay

## 2021-07-07 ENCOUNTER — Ambulatory Visit (HOSPITAL_COMMUNITY): Payer: 59

## 2021-07-09 ENCOUNTER — Ambulatory Visit (HOSPITAL_COMMUNITY): Payer: 59

## 2021-07-14 ENCOUNTER — Ambulatory Visit (HOSPITAL_COMMUNITY): Payer: 59

## 2021-07-16 ENCOUNTER — Encounter (HOSPITAL_COMMUNITY): Payer: Self-pay

## 2021-07-16 ENCOUNTER — Ambulatory Visit (HOSPITAL_COMMUNITY): Payer: 59

## 2021-07-21 ENCOUNTER — Ambulatory Visit (HOSPITAL_COMMUNITY): Payer: 59

## 2021-07-23 ENCOUNTER — Ambulatory Visit (HOSPITAL_COMMUNITY): Payer: 59

## 2021-07-28 ENCOUNTER — Ambulatory Visit (HOSPITAL_COMMUNITY): Payer: 59

## 2021-07-30 ENCOUNTER — Ambulatory Visit (HOSPITAL_COMMUNITY): Payer: 59

## 2021-08-04 ENCOUNTER — Ambulatory Visit (HOSPITAL_COMMUNITY): Payer: 59

## 2021-08-06 ENCOUNTER — Ambulatory Visit (HOSPITAL_COMMUNITY): Payer: 59

## 2021-08-11 ENCOUNTER — Telehealth (HOSPITAL_COMMUNITY): Payer: Self-pay

## 2021-08-11 ENCOUNTER — Ambulatory Visit (HOSPITAL_COMMUNITY): Payer: 59

## 2021-08-11 NOTE — Telephone Encounter (Signed)
No response from pt.  Closed referral  

## 2021-08-13 ENCOUNTER — Ambulatory Visit (HOSPITAL_COMMUNITY): Payer: 59

## 2021-08-18 ENCOUNTER — Ambulatory Visit (HOSPITAL_COMMUNITY): Payer: 59

## 2021-08-20 ENCOUNTER — Ambulatory Visit (HOSPITAL_COMMUNITY): Payer: 59

## 2021-08-25 ENCOUNTER — Ambulatory Visit (HOSPITAL_COMMUNITY): Payer: 59

## 2021-08-27 ENCOUNTER — Ambulatory Visit (HOSPITAL_COMMUNITY): Payer: 59

## 2021-12-03 ENCOUNTER — Ambulatory Visit
Admission: RE | Admit: 2021-12-03 | Discharge: 2021-12-03 | Disposition: A | Discharge status: Home / Self Care | Payer: 59 | Source: Ambulatory Visit | Attending: Internal Medicine | Admitting: Internal Medicine

## 2021-12-03 ENCOUNTER — Other Ambulatory Visit: Payer: Self-pay | Admitting: Internal Medicine

## 2021-12-03 DIAGNOSIS — J069 Acute upper respiratory infection, unspecified: Secondary | ICD-10-CM

## 2022-06-03 ENCOUNTER — Other Ambulatory Visit: Payer: Self-pay | Admitting: Internal Medicine

## 2022-06-03 ENCOUNTER — Ambulatory Visit
Admission: RE | Admit: 2022-06-03 | Discharge: 2022-06-03 | Disposition: A | Discharge status: Home / Self Care | Payer: 59 | Source: Ambulatory Visit | Attending: Internal Medicine | Admitting: Internal Medicine

## 2022-06-03 DIAGNOSIS — R051 Acute cough: Secondary | ICD-10-CM

## 2022-07-15 ENCOUNTER — Ambulatory Visit (HOSPITAL_BASED_OUTPATIENT_CLINIC_OR_DEPARTMENT_OTHER): Payer: 59 | Admitting: Family Medicine

## 2023-01-29 ENCOUNTER — Encounter: Payer: Self-pay | Admitting: Interventional Cardiology

## 2023-04-20 ENCOUNTER — Other Ambulatory Visit: Payer: Self-pay | Admitting: Family Medicine

## 2023-04-20 DIAGNOSIS — I10 Essential (primary) hypertension: Secondary | ICD-10-CM

## 2023-05-12 ENCOUNTER — Other Ambulatory Visit: Payer: Self-pay

## 2023-05-12 ENCOUNTER — Ambulatory Visit: Payer: No Typology Code available for payment source | Admitting: Allergy

## 2023-05-12 ENCOUNTER — Encounter: Payer: Self-pay | Admitting: Allergy

## 2023-05-12 VITALS — BP 142/98 | HR 74 | Temp 98.2°F | Resp 14 | Ht 63.0 in | Wt 219.6 lb

## 2023-05-12 DIAGNOSIS — R682 Dry mouth, unspecified: Secondary | ICD-10-CM

## 2023-05-12 DIAGNOSIS — J310 Chronic rhinitis: Secondary | ICD-10-CM

## 2023-05-12 NOTE — Progress Notes (Signed)
New Patient Note  RE: Kacelyn Fiest MRN: [id number] DOB: Sep 15, 1965 Date of Office Visit: 05/12/2023  Primary care provider: Collene Mares, PA  Chief Complaint: Allergies  History of present illness: Cassie [name] is a 57 y.o. female presenting today for evaluation of other allergy status.  Discussed the use of AI scribe software for clinical note transcription with the patient, who gave verbal consent to proceed.  She reports having persistent sinus and head mucus, which she describes as being "stuck." The patient reports a significant change in her symptoms following a COVID-19 infection in 2019, noting a shift from typical wet allergies to a dryness that she describes as feeling "parched." The patient reports a sensation of being "stopped up" in the nasal area, leading to difficulty in breathing, which triggers her claustrophobia. The patient also reports fluid accumulation in her ears, which occurs a couple of times a year.  The patient has tried various treatments, including Flonase, Allegra D, Zyrtec, Claritin, plain Allegra, and Sudafed, with varying degrees of success. She has also tried saline rinses and a face steam treatment. The patient reports a preference for Mucinex spray and Vicks with the green top, finding these to be more effective. However, she had a negative experience with Astepro due to its taste and burning sensation.  She experienced a facial rash around her eyes and mouth, which she suspects may be due to exposure to a regular scented detergent or a dryer sheet for dog hair. The rash persisted for a month and was eventually treated with hydrocortisone by a dermatologist.  The patient has also sought consultation from an ENT specialist for a raspy voice, which worsens when she is dry. She suspected scar tissue from a tracheotomy from when she required ventilation related to Covid illness, but the ENT specialist found no such issue on  scoping. A lung doctor also confirmed that her lungs were fine. However, she was informed of a reduced ejection fraction in her heart, which caused significant concern.  She was seeing a cardiologist however he is moving and will be seeing a new cardiologist.  She does feel like her breathing is much better than it was around the time she had Covid as she reports she has lost a significant amount of weight which helped.    The patient has made lifestyle changes, including weight loss and performs IV hydration monthly, which she reports have led to improvements in her symptoms. However, she continues to struggle with dryness, particularly in the mouth and nasal area.   She feels like she may drink an 8oz water bottle in the day but does drink other beverages like tea.    She also reports noting mold in the home after noting an open window with condensation and noting black spots on the ceiling.   She is seeking allergy testing to identify potential triggers for her symptoms.      Review of systems: 10pt ROS negative unless above in HPI  All other systems negative unless noted above in HPI  Past medical history: Past Medical History:  Diagnosis Date   Angio-edema    Hypertension    Hypothyroidism    Metabolic syndrome    Recurrent upper respiratory infection (URI)    Urticaria     Past surgical history: Past Surgical History:  Procedure Laterality Date   DENTAL SURGERY      Family history:  Family History  Problem Relation Age of Onset   Allergic rhinitis Mother  Kidney disease Father    Diabetes Father    Breast cancer Neg Hx     Social history: Lives in a home with carpeting with gas and oil heating and central cooling.  3 dogs in the home.  There is no concern for roaches in the home.  There are concerns for water damage or mildew in the home.  She is an Administrator, [employer].  Denies a smoking history.  Medication List: Current Outpatient Medications  Medication Sig Dispense  Refill   fluticasone (FLONASE) 50 MCG/ACT nasal spray Place 1 spray into both nostrils daily.     labetalol (NORMODYNE) 300 MG tablet Take 300 mg by mouth 2 (two) times daily.     NP THYROID 90 MG tablet Take 90 mg by mouth daily as needed.     Semaglutide,0.25 or 0.5MG /DOS, (OZEMPIC, 0.25 OR 0.5 MG/DOSE,) 2 MG/1.5ML SOPN Inject 10 mg into the skin. (Patient not taking: Reported on 05/12/2023)     No current facility-administered medications for this visit.    Known medication allergies: Allergies  Allergen Reactions   Amlodipine Swelling   Prednisone Hives    Worsening hives each time she takes   Sulfa Antibiotics Rash     Physical examination: Blood pressure (!) 142/98, pulse 74, temperature 98.2 F (36.8 C), temperature source Temporal, resp. rate 14, height 5\' 3"  (1.6 m), weight 219 lb 9.6 oz (99.6 kg), SpO2 94%.  General: Alert, interactive, in no acute distress. HEENT: PERRLA, TMs pearly gray, turbinates non-edematous without discharge, post-pharynx non erythematous. Neck: Supple without lymphadenopathy. Lungs: Clear to auscultation without wheezing, rhonchi or rales. {no increased work of breathing. CV: Normal S1, S2 without murmurs. Abdomen: Nondistended, nontender. Skin: Warm and dry, without lesions or rashes. Extremities:  No clubbing, cyanosis or edema. Neuro:   Grossly intact.  Diagnositics/Labs: None today  Assessment and plan: Rhinitis Mucosal dryness  She will return on 05/19/2023 for skin testing to environmental allergens.  Advised to hold all antihistamines for 3 days prior to this testing. Allergy treatment plan will be based on her testing. Advise she increase her water intake to help with her mucosal dryness. I advise she ask her dermatologist if the testing they were considering was patch testing for contact dermatitis and if so they typically have a more comprehensive testing panel than I do and that recommend she have that done with her  dermatologist.  I appreciate the opportunity to take part in [place] care. Please do not hesitate to contact me with questions.  Sincerely,   Margo Aye, MD Allergy/Immunology [facility]

## 2023-05-12 NOTE — Patient Instructions (Signed)
She will return on 05/19/2023 for skin testing to environmental allergens.  Advised to hold all antihistamines for 3 days prior to this testing. Allergy treatment plan will be based on her testing. Advise she increase her water intake to help with her mucosal dryness. I advise she ask her dermatologist if the testing they were considering was patch testing for contact dermatitis and if so they typically have a more comprehensive testing panel than I do and that recommend she have that done with her dermatologist.

## 2023-05-19 ENCOUNTER — Ambulatory Visit: Payer: No Typology Code available for payment source | Admitting: Allergy

## 2023-06-10 ENCOUNTER — Ambulatory Visit: Payer: No Typology Code available for payment source | Attending: Cardiology | Admitting: Cardiology

## 2023-06-10 ENCOUNTER — Encounter: Payer: Self-pay | Admitting: Cardiology

## 2023-06-10 VITALS — BP 130/86 | HR 83 | Ht 63.0 in | Wt 213.6 lb

## 2023-06-10 DIAGNOSIS — I10 Essential (primary) hypertension: Secondary | ICD-10-CM

## 2023-06-10 NOTE — Progress Notes (Signed)
Cardiology Office Note:  .   Date:  06/10/2023  ID:  Nehemiah Settle, DOB 1965-11-05, MRN [id number] PCP: Collene Mares, PA  [facility] Providers Cardiologist:  Donato Schultz, MD     History of Present Illness: .   Cambria Umeda is a 57 y.o. female Discussed with the use of AI scribe  History of Present Illness   The patient, a 57 year old individual with a history of left ventricular hypertrophy, grade three diastolic dysfunction, mildly dilated ascending aorta, hypertension, and hypothyroidism, presents for a follow-up visit. She also has a history of enlargement of the main pulmonary artery and has been diagnosed with diastolic heart failure, currently NYHA class one. She is euvolemic without diuretics and has pulmonary hypertension with RVSP of 53 mmHg on prior echocardiograms, along with a dilated pulmonary artery.  The patient reports no recent health issues and denies experiencing chest pain or shortness of breath. She mentions a past incident in the hospital related to COVID-19, which led to ARDS and lung issues, causing anxiety around medical settings. She also reports experiencing thick mucus and weight loss following her recovery from the hospital incident.  The patient is currently on labetalol and lisinopril for her heart condition. She stopped taking Lasix the previous year due to side effects, including nosebleeds and mouth dryness. She denies any fainting episodes or family history of sudden cardiac death.  The patient is also planning to undergo plastic surgery and has concerns about anesthesia due to her heart condition. She is also expecting a baby via a surrogate and expresses a need to be more active in preparation for the new addition to her family.          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 Mildly dilated ascending aorta 41 mm  DIAGNOSTIC Echocardiogram: EF 65%, mild concentric left ventricular hypertrophy, mild MR, left ventricle dimensions 1.3 cm (05/31/2022) Echocardiogram previous: Pulmonary hypertension with RVSP of 53 mmHg, dilated pulmonary artery  Risk Assessment/Calculations:            Physical Exam:   VS:  BP 130/86   Pulse 83   Ht 5\' 3"  (1.6 m)   Wt 213 lb 9.6 oz (96.9 kg)   SpO2 95%   BMI 37.84 kg/m    Wt Readings from Last 3 Encounters:  06/10/23 213 lb 9.6 oz (96.9 kg)  05/12/23 219 lb 9.6 oz (99.6 kg)  06/04/21 251 lb 12.8 oz (114.2 kg)    GEN: Well nourished, well developed in no acute distress NECK: No JVD; No carotid bruits CARDIAC: RRR, no murmurs, no rubs, no gallops RESPIRATORY:  Clear to auscultation without rales, wheezing or rhonchi  ABDOMEN: Soft, non-tender, non-distended EXTREMITIES:  No edema; No deformity   ASSESSMENT AND PLAN: .    Assessment and Plan    Diastolic Heart Failure (NYHA Class I) Diastolic heart failure, NYHA class I, euvolemic without  diuretics. Echocardiogram on 05/31/2022 showed EF 65% and mild concentric LV hypertrophy. BNP 171 on 06/29/2022. No recent chest pain or dyspnea. BP 130/86 mmHg. On labetalol 300 mg BID and lisinopril 40 mg daily. Discontinued Lasix due to epistaxis and cheilitis. Advised to maintain active lifestyle and weight loss. - Continue labetalol 300 mg BID - Continue lisinopril 40 mg daily - Annual follow-up  Hypertension Hypertension  managed with labetalol and lisinopril. BP 130/86 mmHg, well-controlled. - Continue labetalol 300 mg BID - Continue lisinopril 40 mg daily  Pulmonary Hypertension Pulmonary hypertension with RVSP 53 mmHg on prior echocardiograms and dilated pulmonary artery (4.4 cm on CT). No recent symptoms. - Monitor for symptoms. Treat BP, diet, exercise. This has improved as well.   Hypothyroidism Hypothyroidism, no changes discussed. - Continue current management and follow-up as needed  General Health Maintenance Advised to maintain active lifestyle and weight loss. No new health maintenance issues. - Encourage physical activity and weight loss - Annual follow-up  Follow-up - Schedule annual follow-up appointment - APP ok      May proceed with surgery if desired        Signed, Donato Schultz, MD

## 2023-06-10 NOTE — Patient Instructions (Signed)
Medication Instructions:  The current medical regimen is effective;  continue present plan and medications.  *If you need a refill on your cardiac medications before your next appointment, please call your pharmacy*  Follow-Up: At [facility], you and your health needs are our priority.  As part of our continuing mission to provide you with exceptional heart care, we have created designated Provider Care Teams.  These Care Teams include your primary Cardiologist (physician) and Advanced Practice Providers (APPs -  Physician Assistants and Nurse Practitioners) who all work together to provide you with the care you need, when you need it.  We recommend signing up for the patient portal called "MyChart".  Sign up information is provided on this After Visit Summary.  MyChart is used to connect with patients for Virtual Visits (Telemedicine).  Patients are able to view lab/test results, encounter notes, upcoming appointments, etc.  Non-urgent messages can be sent to your provider as well.   To learn more about what you can do with MyChart, go to [email or web address].    Your next appointment:   1 year(s)  Provider:   Jari Favre, PA-C, Robin Searing, NP, Jacolyn Reedy, PA-C, Eligha Bridegroom, NP, Tereso Newcomer, PA-C, or Perlie Gold, PA-C

## 2024-01-14 LAB — COLOGUARD: COLOGUARD: NEGATIVE
